# Patient Record
Sex: Female | Born: 1985 | Race: White | Hispanic: No | Marital: Single
Health system: Southern US, Academic
[De-identification: ages and names within clinical notes are randomized; demographics above are authoritative.]

## PROBLEM LIST (undated history)

## (undated) ENCOUNTER — Ambulatory Visit
Payer: PRIVATE HEALTH INSURANCE | Attending: Student in an Organized Health Care Education/Training Program | Primary: Student in an Organized Health Care Education/Training Program

## (undated) ENCOUNTER — Telehealth

## (undated) ENCOUNTER — Telehealth
Attending: Student in an Organized Health Care Education/Training Program | Primary: Student in an Organized Health Care Education/Training Program

## (undated) ENCOUNTER — Encounter: Attending: Family | Primary: Family

## (undated) ENCOUNTER — Encounter
Attending: Student in an Organized Health Care Education/Training Program | Primary: Student in an Organized Health Care Education/Training Program

## (undated) ENCOUNTER — Encounter

## (undated) ENCOUNTER — Ambulatory Visit: Attending: Family | Primary: Family

## (undated) ENCOUNTER — Ambulatory Visit: Payer: MEDICAID | Attending: Family | Primary: Family

## (undated) ENCOUNTER — Encounter: Attending: Clinical | Primary: Clinical

## (undated) ENCOUNTER — Telehealth
Attending: Pharmacist Clinician (PhC)/ Clinical Pharmacy Specialist | Primary: Pharmacist Clinician (PhC)/ Clinical Pharmacy Specialist

## (undated) ENCOUNTER — Ambulatory Visit

## (undated) ENCOUNTER — Ambulatory Visit: Payer: PRIVATE HEALTH INSURANCE | Attending: Clinical | Primary: Clinical

## (undated) ENCOUNTER — Ambulatory Visit: Payer: PRIVATE HEALTH INSURANCE

## (undated) ENCOUNTER — Ambulatory Visit: Attending: Surgery | Primary: Surgery

## (undated) ENCOUNTER — Ambulatory Visit: Payer: PRIVATE HEALTH INSURANCE | Attending: Critical Care Medicine | Primary: Critical Care Medicine

## (undated) ENCOUNTER — Ambulatory Visit
Attending: Pharmacist Clinician (PhC)/ Clinical Pharmacy Specialist | Primary: Pharmacist Clinician (PhC)/ Clinical Pharmacy Specialist

## (undated) ENCOUNTER — Encounter: Attending: Critical Care Medicine | Primary: Critical Care Medicine

## (undated) ENCOUNTER — Ambulatory Visit: Payer: PRIVATE HEALTH INSURANCE | Attending: Adult Health | Primary: Adult Health

## (undated) ENCOUNTER — Telehealth: Attending: Family | Primary: Family

## (undated) ENCOUNTER — Encounter: Attending: Gastroenterology | Primary: Gastroenterology

## (undated) ENCOUNTER — Telehealth: Attending: Hematology & Oncology | Primary: Hematology & Oncology

## (undated) ENCOUNTER — Encounter
Attending: Pharmacist Clinician (PhC)/ Clinical Pharmacy Specialist | Primary: Pharmacist Clinician (PhC)/ Clinical Pharmacy Specialist

## (undated) ENCOUNTER — Inpatient Hospital Stay

## (undated) ENCOUNTER — Encounter: Payer: PRIVATE HEALTH INSURANCE | Attending: Family | Primary: Family

## (undated) DIAGNOSIS — I10 Essential (primary) hypertension: Secondary | ICD-10-CM

## (undated) DIAGNOSIS — F1110 Opioid abuse, uncomplicated: Secondary | ICD-10-CM

## (undated) DIAGNOSIS — F419 Anxiety disorder, unspecified: Secondary | ICD-10-CM

## (undated) DIAGNOSIS — J45909 Unspecified asthma, uncomplicated: Secondary | ICD-10-CM

## (undated) HISTORY — PX: TONSILLECTOMY: SUR1361

## (undated) HISTORY — PX: TUBAL LIGATION: SHX77

## (undated) HISTORY — PX: DENTAL SURGERY: SHX609

## (undated) HISTORY — DX: Anxiety disorder, unspecified: F41.9

## (undated) HISTORY — PX: ABSCESS DRAINAGE: SHX1119

---
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
## 2009-02-15 ENCOUNTER — Emergency Department: Payer: Self-pay | Admitting: Emergency Medicine

## 2009-04-18 ENCOUNTER — Encounter: Payer: Self-pay | Admitting: Maternal & Fetal Medicine

## 2009-05-30 ENCOUNTER — Encounter: Payer: Self-pay | Admitting: Maternal and Fetal Medicine

## 2009-06-06 ENCOUNTER — Encounter: Payer: Self-pay | Admitting: Maternal & Fetal Medicine

## 2009-06-09 ENCOUNTER — Encounter: Payer: Self-pay | Admitting: Maternal and Fetal Medicine

## 2009-06-13 ENCOUNTER — Encounter: Payer: Self-pay | Admitting: Obstetrics & Gynecology

## 2009-06-16 ENCOUNTER — Encounter: Payer: Self-pay | Admitting: Maternal & Fetal Medicine

## 2009-06-16 ENCOUNTER — Observation Stay: Payer: Self-pay | Admitting: Obstetrics and Gynecology

## 2009-06-20 ENCOUNTER — Encounter: Payer: Self-pay | Admitting: Obstetrics and Gynecology

## 2009-06-21 ENCOUNTER — Observation Stay: Payer: Self-pay | Admitting: Obstetrics and Gynecology

## 2010-03-20 ENCOUNTER — Encounter: Payer: Self-pay | Admitting: Maternal & Fetal Medicine

## 2010-03-23 ENCOUNTER — Encounter: Payer: Self-pay | Admitting: Maternal & Fetal Medicine

## 2010-04-24 ENCOUNTER — Encounter: Payer: Self-pay | Admitting: Obstetrics & Gynecology

## 2010-07-17 ENCOUNTER — Encounter: Payer: Self-pay | Admitting: Maternal & Fetal Medicine

## 2010-07-24 ENCOUNTER — Encounter: Payer: Self-pay | Admitting: Obstetrics & Gynecology

## 2010-07-31 ENCOUNTER — Encounter: Payer: Self-pay | Admitting: Maternal & Fetal Medicine

## 2010-08-07 ENCOUNTER — Encounter: Payer: Self-pay | Admitting: Maternal and Fetal Medicine

## 2010-08-14 ENCOUNTER — Encounter: Payer: Self-pay | Admitting: Obstetrics and Gynecology

## 2010-08-15 ENCOUNTER — Inpatient Hospital Stay: Payer: Self-pay

## 2010-08-21 ENCOUNTER — Encounter: Payer: Self-pay | Admitting: Maternal & Fetal Medicine

## 2010-09-25 ENCOUNTER — Ambulatory Visit: Payer: Self-pay | Admitting: Obstetrics & Gynecology

## 2010-09-28 ENCOUNTER — Ambulatory Visit: Payer: Self-pay | Admitting: Obstetrics & Gynecology

## 2010-11-28 ENCOUNTER — Emergency Department: Payer: Self-pay | Admitting: Emergency Medicine

## 2011-07-12 ENCOUNTER — Emergency Department: Payer: Self-pay | Admitting: Emergency Medicine

## 2012-08-29 ENCOUNTER — Emergency Department: Payer: Self-pay | Admitting: Emergency Medicine

## 2013-10-02 ENCOUNTER — Emergency Department: Payer: Self-pay | Admitting: Emergency Medicine

## 2013-10-02 LAB — COMPREHENSIVE METABOLIC PANEL
Albumin: 3.8 g/dL (ref 3.4–5.0)
Creatinine: 0.72 mg/dL (ref 0.60–1.30)
EGFR (African American): >60
EGFR (Non-African Amer.): >60
Glucose: 99 mg/dL (ref 65–99)
Osmolality: 278 (ref 275–301)

## 2013-10-02 LAB — URINALYSIS, COMPLETE
Bacteria: NONE SEEN
Bilirubin,UR: NEGATIVE
Glucose,UR: NEGATIVE mg/dL (ref 0–75)
Ketone: NEGATIVE
Nitrite: NEGATIVE
Ph: 7 (ref 4.5–8.0)
Protein: NEGATIVE
RBC,UR: 1 /HPF (ref 0–5)
Specific Gravity: 1.01 (ref 1.003–1.030)
Squamous Epithelial: 4

## 2013-10-02 LAB — DRUG SCREEN, URINE
Amphetamines, Ur Screen: NEGATIVE (ref ?–1000)
Barbiturates, Ur Screen: NEGATIVE (ref ?–200)
Cocaine Metabolite,Ur ~~LOC~~: NEGATIVE (ref ?–300)
MDMA (Ecstasy)Ur Screen: NEGATIVE (ref ?–500)
Methadone, Ur Screen: NEGATIVE (ref ?–300)
Opiate, Ur Screen: POSITIVE (ref ?–300)
Phencyclidine (PCP) Ur S: NEGATIVE (ref ?–25)
Tricyclic, Ur Screen: NEGATIVE (ref ?–1000)

## 2013-10-02 LAB — ETHANOL: Ethanol %: <0.003 % (ref 0.000–0.080)

## 2013-10-02 LAB — CBC
MCH: 30.2 pg (ref 26.0–34.0)
MCV: 90 fL (ref 80–100)
RDW: 13.4 % (ref 11.5–14.5)

## 2014-06-09 ENCOUNTER — Emergency Department: Payer: Self-pay | Admitting: Emergency Medicine

## 2014-08-21 LAB — BASIC METABOLIC PANEL
ANION GAP: 9 (ref 7–16)
BUN: 8 mg/dL (ref 7–18)
Calcium, Total: 9.4 mg/dL (ref 8.5–10.1)
Chloride: 108 mmol/L — ABNORMAL HIGH (ref 98–107)
Co2: 23 mmol/L (ref 21–32)
Creatinine: 0.75 mg/dL (ref 0.60–1.30)
EGFR (Non-African Amer.): >60
GLUCOSE: 82 mg/dL (ref 65–99)
OSMOLALITY: 277 (ref 275–301)
POTASSIUM: 4 mmol/L (ref 3.5–5.1)
SODIUM: 140 mmol/L (ref 136–145)

## 2014-08-21 LAB — CBC
HCT: 42.2 % (ref 35.0–47.0)
HGB: 13.8 g/dL (ref 12.0–16.0)
MCH: 28.9 pg (ref 26.0–34.0)
MCHC: 32.7 g/dL (ref 32.0–36.0)
MCV: 88 fL (ref 80–100)
Platelet: 288 10*3/uL (ref 150–440)
RBC: 4.78 10*6/uL (ref 3.80–5.20)
RDW: 13.2 % (ref 11.5–14.5)
WBC: 13.9 10*3/uL — ABNORMAL HIGH (ref 3.6–11.0)

## 2014-08-22 ENCOUNTER — Observation Stay: Payer: Self-pay | Admitting: Surgery

## 2014-08-26 LAB — WOUND CULTURE

## 2014-09-15 ENCOUNTER — Emergency Department: Payer: Self-pay | Admitting: Emergency Medicine

## 2014-09-15 LAB — BASIC METABOLIC PANEL
ANION GAP: 6 — AB (ref 7–16)
BUN: 8 mg/dL (ref 7–18)
CALCIUM: 8.5 mg/dL (ref 8.5–10.1)
CO2: 25 mmol/L (ref 21–32)
Chloride: 109 mmol/L — ABNORMAL HIGH (ref 98–107)
Creatinine: 0.56 mg/dL — ABNORMAL LOW (ref 0.60–1.30)
Glucose: 87 mg/dL (ref 65–99)
Osmolality: 277 (ref 275–301)
Potassium: 4.2 mmol/L (ref 3.5–5.1)
Sodium: 140 mmol/L (ref 136–145)

## 2014-09-15 LAB — CBC WITH DIFFERENTIAL/PLATELET
Basophil #: 0.1 10*3/uL (ref 0.0–0.1)
Basophil %: 0.6 %
Eosinophil #: 0.9 10*3/uL — ABNORMAL HIGH (ref 0.0–0.7)
Eosinophil %: 7 %
HCT: 40.2 % (ref 35.0–47.0)
HGB: 13.3 g/dL (ref 12.0–16.0)
Lymphocyte #: 3 10*3/uL (ref 1.0–3.6)
Lymphocyte %: 23.8 %
MCH: 28.6 pg (ref 26.0–34.0)
MCHC: 33.1 g/dL (ref 32.0–36.0)
MCV: 87 fL (ref 80–100)
Monocyte #: 1 x10 3/mm — ABNORMAL HIGH (ref 0.2–0.9)
Monocyte %: 8.1 %
NEUTROS PCT: 60.5 %
Neutrophil #: 7.7 10*3/uL — ABNORMAL HIGH (ref 1.4–6.5)
Platelet: 322 10*3/uL (ref 150–440)
RBC: 4.64 10*6/uL (ref 3.80–5.20)
RDW: 13.9 % (ref 11.5–14.5)
WBC: 12.7 10*3/uL — AB (ref 3.6–11.0)

## 2014-10-31 ENCOUNTER — Emergency Department: Payer: Self-pay | Admitting: Emergency Medicine

## 2014-10-31 LAB — CBC
HCT: 43.1 %
HGB: 14.1 g/dL
MCH: 28.2 pg
MCHC: 32.7 g/dL
MCV: 86 fL
Platelet: 270 x10 3/mm 3
RBC: 5.01 X10 6/mm 3
RDW: 14.3 %
WBC: 6.1 x10 3/mm 3

## 2015-01-01 ENCOUNTER — Emergency Department: Payer: Self-pay | Admitting: Emergency Medicine

## 2015-01-01 LAB — BASIC METABOLIC PANEL
ANION GAP: 5 — AB (ref 7–16)
BUN: 8 mg/dL (ref 7–18)
CHLORIDE: 109 mmol/L — AB (ref 98–107)
Calcium, Total: 8.2 mg/dL — ABNORMAL LOW (ref 8.5–10.1)
Co2: 25 mmol/L (ref 21–32)
Creatinine: 0.74 mg/dL (ref 0.60–1.30)
EGFR (African American): >60
EGFR (Non-African Amer.): >60
Glucose: 92 mg/dL (ref 65–99)
OSMOLALITY: 276 (ref 275–301)
POTASSIUM: 4 mmol/L (ref 3.5–5.1)
SODIUM: 139 mmol/L (ref 136–145)

## 2015-01-01 LAB — CBC WITH DIFFERENTIAL/PLATELET
Basophil #: 0.1 10*3/uL (ref 0.0–0.1)
Basophil %: 0.6 %
EOS ABS: 0.6 10*3/uL (ref 0.0–0.7)
Eosinophil %: 6.5 %
HCT: 38.5 % (ref 35.0–47.0)
HGB: 12.5 g/dL (ref 12.0–16.0)
Lymphocyte #: 2.2 10*3/uL (ref 1.0–3.6)
Lymphocyte %: 23.1 %
MCH: 28.4 pg (ref 26.0–34.0)
MCHC: 32.6 g/dL (ref 32.0–36.0)
MCV: 87 fL (ref 80–100)
Monocyte #: 0.8 x10 3/mm (ref 0.2–0.9)
Monocyte %: 8.5 %
NEUTROS PCT: 61.3 %
Neutrophil #: 5.8 10*3/uL (ref 1.4–6.5)
Platelet: 262 10*3/uL (ref 150–440)
RBC: 4.41 10*6/uL (ref 3.80–5.20)
RDW: 13.8 % (ref 11.5–14.5)
WBC: 9.4 10*3/uL (ref 3.6–11.0)

## 2015-01-06 LAB — CULTURE, BLOOD (SINGLE)

## 2015-02-22 IMAGING — US US BREAST*L* LIMITED INC AXILLA
1 series · 6 of 6 positions shown · non-contrast
Comparison: August 22, 2014

CLINICAL DATA: Recent abscess status post surgical debridement and
drainage. The site of prior abscess is again draining and is red.

EXAM:
ULTRASOUND OF THE left BREAST

[Series 1: us breast*left* limited inc axilla · 0.08mm/px · 6 of 6 slices shown]
[im 1/6]
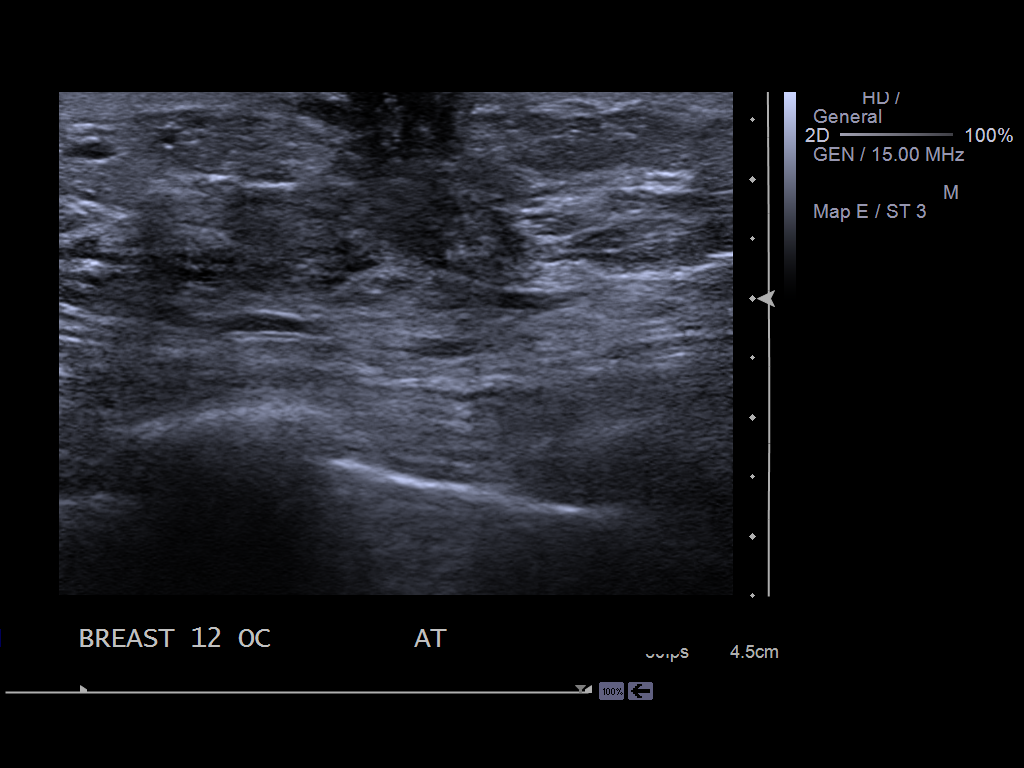
[im 2/6]
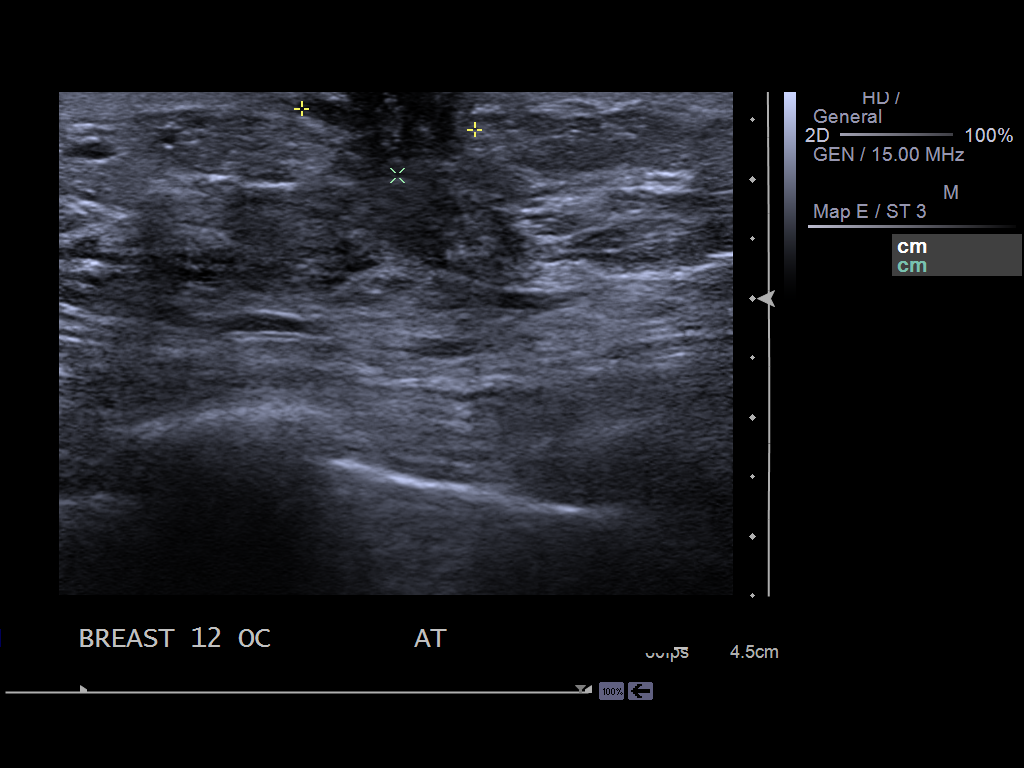
[im 3/6]
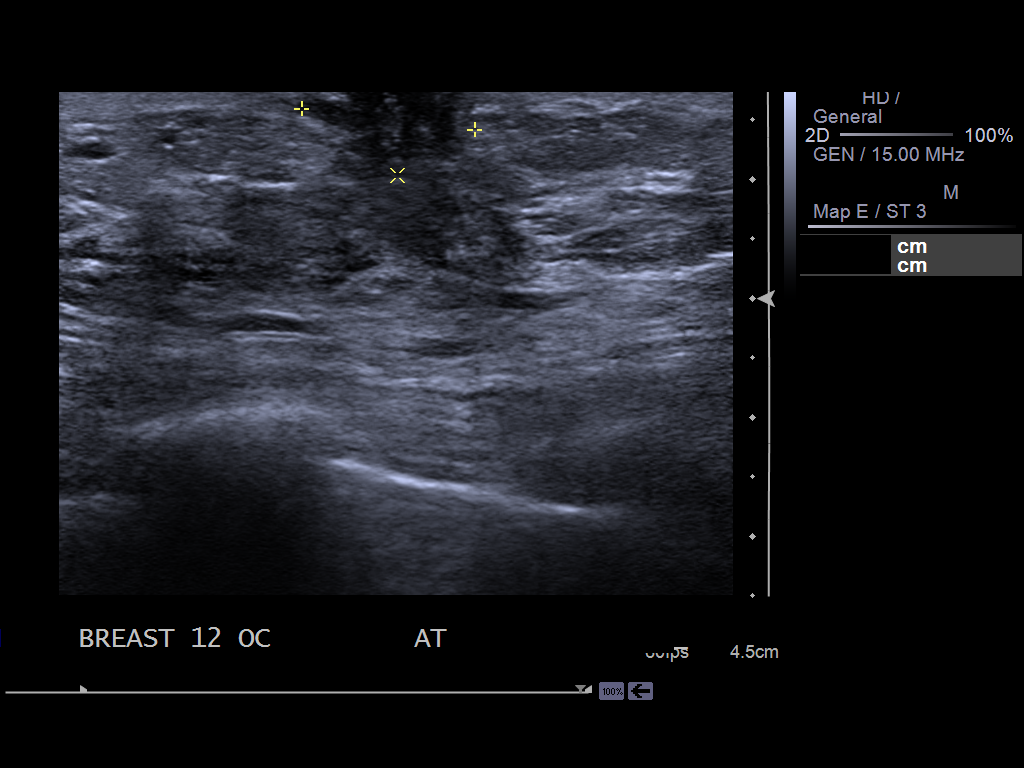
[im 4/6]
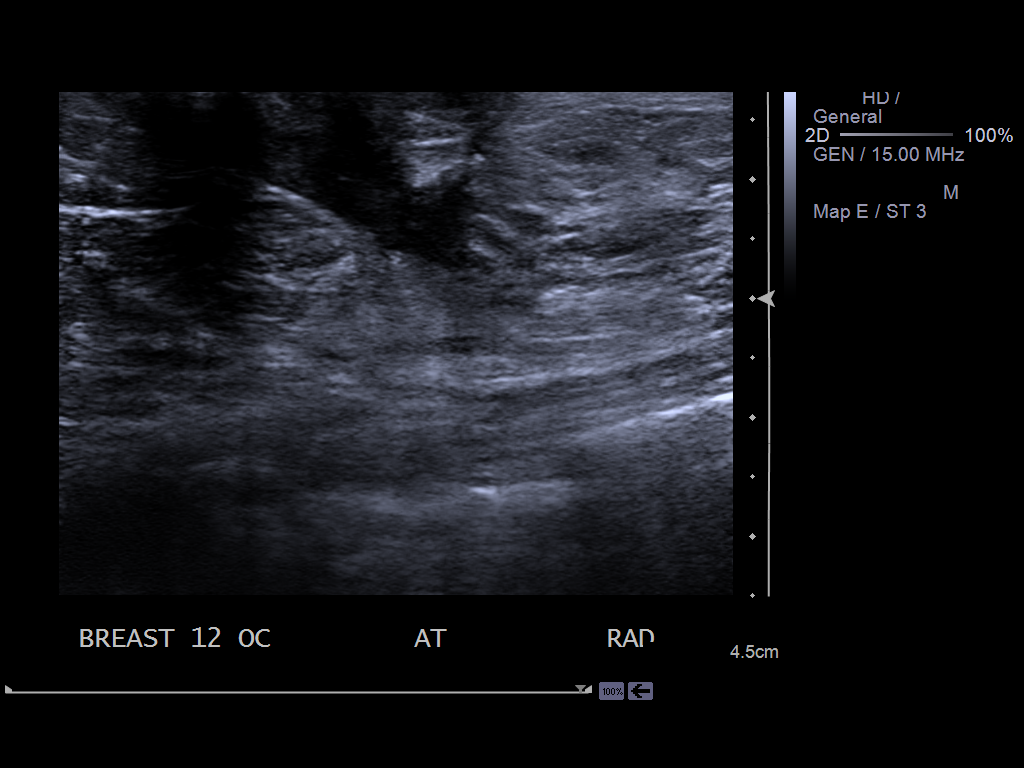
[im 5/6]
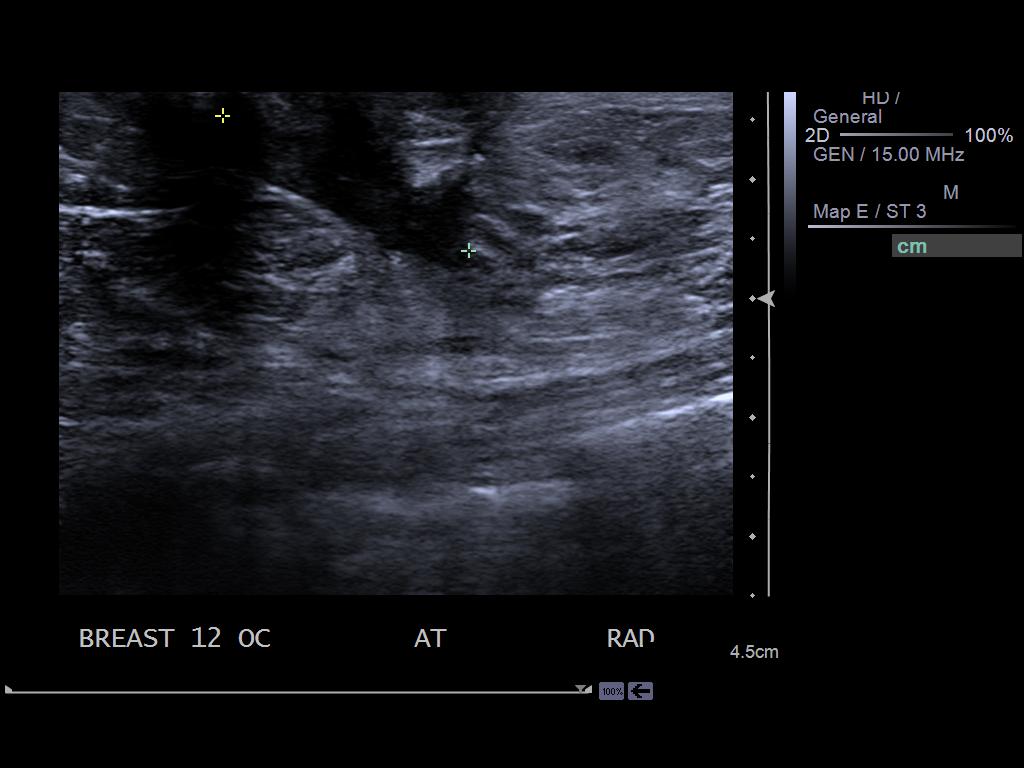
[im 6/6]
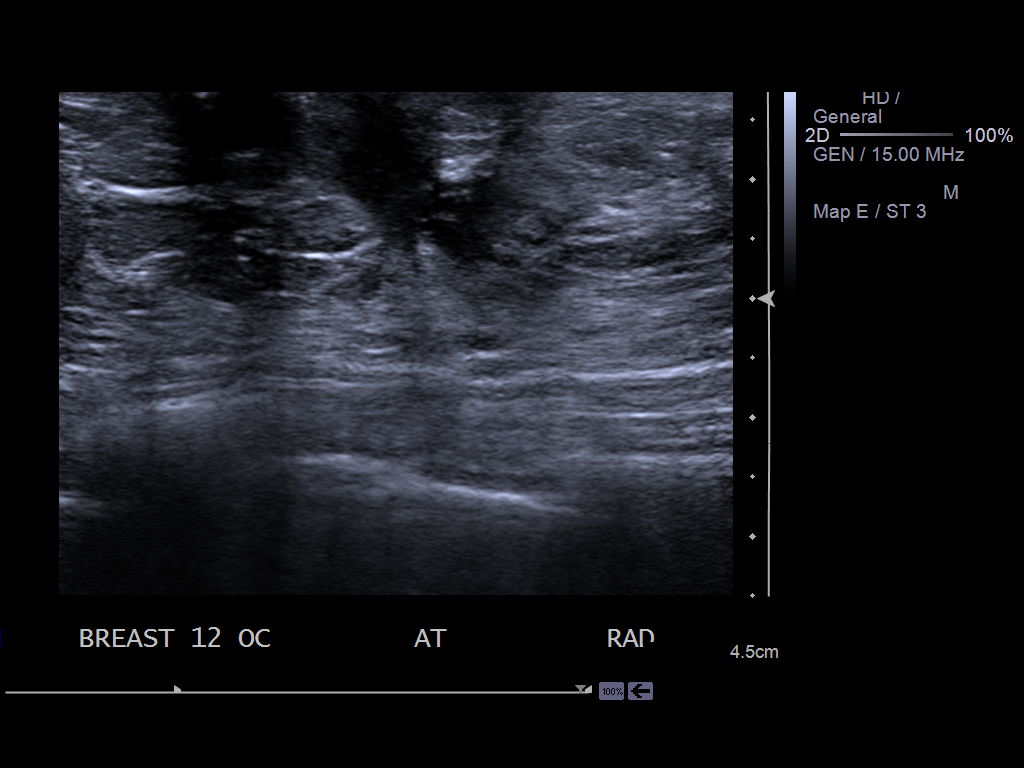

[6 of 6 positions shown; findings below may reference images not displayed]

FINDINGS: Ultrasound is performed, showing a 2.36 cm abscess at the left
breast 12 o'clock subareolar region subjacent to the incision site.
IMPRESSION: Benign findings.

RECOMMENDATION:
Surgical consultation.

I have discussed the findings and recommendations with the patient.
Results were also provided in writing at the conclusion of the
visit. If applicable, a reminder letter will be sent to the patient
regarding the next appointment.

BI-RADS CATEGORY  2: Benign Finding(s)

## 2015-02-27 ENCOUNTER — Emergency Department: Payer: Self-pay | Admitting: Emergency Medicine

## 2015-03-01 ENCOUNTER — Emergency Department: Payer: Self-pay | Admitting: Emergency Medicine

## 2015-03-19 ENCOUNTER — Emergency Department: Payer: Self-pay | Admitting: Emergency Medicine

## 2015-03-19 LAB — BASIC METABOLIC PANEL
Anion Gap: 7 (ref 7–16)
BUN: 10 mg/dL
Calcium, Total: 8.8 mg/dL — ABNORMAL LOW
Chloride: 102 mmol/L
Co2: 24 mmol/L
Creatinine: 0.85 mg/dL
EGFR (African American): >60
EGFR (Non-African Amer.): >60
GLUCOSE: 94 mg/dL
Potassium: 4.1 mmol/L
SODIUM: 133 mmol/L — AB

## 2015-03-19 LAB — CBC WITH DIFFERENTIAL/PLATELET
BASOS ABS: 0 10*3/uL (ref 0.0–0.1)
BASOS PCT: 0.5 %
Eosinophil #: 0.7 10*3/uL (ref 0.0–0.7)
Eosinophil %: 9.7 %
HCT: 39.6 % (ref 35.0–47.0)
HGB: 13.3 g/dL (ref 12.0–16.0)
LYMPHS PCT: 27.7 %
Lymphocyte #: 1.9 10*3/uL (ref 1.0–3.6)
MCH: 28.7 pg (ref 26.0–34.0)
MCHC: 33.6 g/dL (ref 32.0–36.0)
MCV: 85 fL (ref 80–100)
MONOS PCT: 8 %
Monocyte #: 0.5 x10 3/mm (ref 0.2–0.9)
Neutrophil #: 3.7 10*3/uL (ref 1.4–6.5)
Neutrophil %: 54.1 %
Platelet: 303 10*3/uL (ref 150–440)
RBC: 4.63 10*6/uL (ref 3.80–5.20)
RDW: 14.6 % — ABNORMAL HIGH (ref 11.5–14.5)
WBC: 6.9 10*3/uL (ref 3.6–11.0)

## 2015-03-25 LAB — WOUND CULTURE

## 2015-04-16 NOTE — H&P (Signed)
   Subjective/Chief Complaint left breast abscess   History of Present Illness one week worsening left br pain, throbbing, now draining no f/c no prior episode no prior mass, no trauma history, not postpartum   Past History PMH tob abuse PSH none   Past Medical Health Smoking   Past Med/Surgical Hx:  Denies medical history:   Tonsillectomy:   Tubal Ligation:   Denies surgical history.:   ALLERGIES:  Tramadol: Rash  Family and Social History:  Family History Non-Contributory   Social History positive  tobacco, positive tobacco (Greater than 1 year), negative ETOH, homemaker   + Tobacco Current (within 1 year)   Place of Living Home   Review of Systems:  Fever/Chills No   Cough No   Abdominal Pain No   Diarrhea No   Constipation No   Nausea/Vomiting No   SOB/DOE No   Chest Pain No   Dysuria No   Tolerating Diet Yes   Medications/Allergies Reviewed Medications/Allergies reviewed   Physical Exam:  GEN no acute distress   HEENT pink conjunctivae   NECK supple   RESP normal resp effort  clear BS   CARD regular rate   ABD denies tenderness  no hernia   LYMPH negative neck   EXTR negative edema   SKIN normal to palpation   PSYCH alert, A+O to time, place, person   Additional Comments left breast erythematous hard, fluctuant, draining min pus from nipple areolar complex medial very tender   Lab Results: Routine Chem:  29-Aug-15 20:08   Glucose, Serum 82  BUN 8  Creatinine (comp) 0.75  Sodium, Serum 140  Potassium, Serum 4.0  Chloride, Serum  108  CO2, Serum 23  Calcium (Total), Serum 9.4  Anion Gap 9  Osmolality (calc) 277  eGFR (African American) >60  eGFR (Non-African American) >60 (eGFR values <51mL/min/1.73 m2 may be an indication of chronic kidney disease (CKD). Calculated eGFR is useful in patients with stable renal function. The eGFR calculation will not be reliable in acutely ill patients when serum creatinine is changing  rapidly. It is not useful in  patients on dialysis. The eGFR calculation may not be applicable to patients at the low and high extremes of body sizes, pregnant women, and vegetarians.)  Routine Hem:  29-Aug-15 20:08   WBC (CBC)  13.9  RBC (CBC) 4.78  Hemoglobin (CBC) 13.8  Hematocrit (CBC) 42.2  Platelet Count (CBC) 288 (Result(s) reported on 21 Aug 2014 at 08:21PM.)  MCV 88  MCH 28.9  MCHC 32.7  RDW 13.2    Assessment/Admission Diagnosis U/S not yet read but pers rev'd large left br abscess will need I&D in OR options rationale and risks rev'd agrees with plan   Electronic Signatures: Florene Glen (MD)  (Signed 30-Aug-15 04:22)  Authored: CHIEF COMPLAINT and HISTORY, PAST MEDICAL/SURGIAL HISTORY, ALLERGIES, FAMILY AND SOCIAL HISTORY, REVIEW OF SYSTEMS, PHYSICAL EXAM, LABS, ASSESSMENT AND PLAN   Last Updated: 30-Aug-15 04:22 by Florene Glen (MD)

## 2015-04-16 NOTE — H&P (Signed)
PATIENT NAME:  Denise Rubio, Denise Rubio MR#:  [id number] DATE OF BIRTH:  1986-03-02  DATE OF ADMISSION:  08/22/2014  CHIEF COMPLAINT: Left breast pain.   HISTORY OF PRESENT ILLNESS: This is a patient with 1 week of left breast pain, which has been worsening and it started draining yesterday. She has had no fevers or chills. No nausea, vomiting, no prior episode and no mass prior to this episode. She is not postpartum and denies any breast injury. A work-up in the Emergency Room suggested breast abscess. I was consulted to see the patient.   PAST MEDICAL HISTORY: None.   PAST SURGICAL HISTORY: None, denies any prior operations.   ALLERGIES: TRAMADOL.   MEDICATIONS: None.   FAMILY HISTORY: Noncontributory.   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   REVIEW OF SYSTEMS: A 10 system review has been performed and negative with the exception of that mentioned in the history of present illness.   PHYSICAL EXAMINATION:  GENERAL: Healthy, comfortable-appearing female patient. Multiple piercings are noted.  VITAL SIGNS: Temperature of 98.7, pulse of 82, respirations 18, blood pressure 137/85, 98% room air saturation. Pain scale of 10.  HEENT: No scleral icterus.  NECK: No palpable neck nodes.  CHEST: Clear to auscultation.  CARDIAC: Regular rate and rhythm.  ABDOMEN: Soft, nontender.  EXTREMITIES: Without edema.  NEUROLOGIC: Grossly intact.  INTEGUMENT: No jaundice.  BREASTS: Shows no mass in the right breast. Left breast is hard and firm. It is erythematous, especially superiorly. There is obvious, but minimal, drainage from a spot on the nipple areolar complex somewhat medially which is frank pus. There is fluctuance in the area of the nipple areolar complex superiorly at 12:00.   An ultrasound report is not yet available, but the films been personally reviewed and are highly suggestive  of a large abscess.   Electrolytes are within normal limits.   White blood cell count is 13.9, hemoglobin and hematocrit of 13.8 and 42 with a platelet count of 288,000. Chest x-ray is within normal limits.   ASSESSMENT AND PLAN: This is a patient with a left breast abscess that will require incision and drainage in the operating room. The rationale for this has been discussed. The options of observation have been reviewed. The risks of bleeding, infection, multiple incisions, drain tubes and recurrence as well as, cosmetic deformity have all been reviewed with her. Nursing staff was present, as was a family member who was asleep in the ER. The patient understood and agreed to proceed with this plan.    ____________________________ Adah Salvageichard E. Excell Seltzerooper, MD rec:JT D: 08/22/2014 04:25:50 ET T: 08/22/2014 05:02:18 ET JOB#: [id number]  cc: Adah Salvageichard E. Excell Seltzerooper, MD, <Dictator> Lattie HawICHARD E Shaquasha Gerstel MD ELECTRONICALLY SIGNED 08/22/2014 19:37

## 2015-04-16 NOTE — Op Note (Signed)
PATIENT NAME:  Denise Rubio, Denise Rubio MR#:  [id number] DATE OF BIRTH:  01/05/1986  DATE OF PROCEDURE:  08/22/2014  PREOPERATIVE DIAGNOSIS: Left breast abscess.   POSTOPERATIVE DIAGNOSIS: Left breast abscess.  PROCEDURE PERFORMED: Incision and drainage, left breast abscess.   ANESTHESIA: General.   SURGEON: Quentin Orealph L. Ely, MD  OPERATIVE PROCEDURE: With the patient in the supine position, after the induction of appropriate general anesthesia, the patient's left breast was prepped and draped in sterile towels. An incision was made along the areolar border in the upper outer quadrant and carried down bluntly through the subcutaneous tissue. A large amount of creamy purulent material was encountered. It was cultured. The area was dissected with a Kelly clamp, irrigated, and a Penrose drain placed. It did not appear to communicate to any other major pocket in the breast and appeared to be confined to the right upper outer quadrant. Drain was secured with 3-0 nylon. Sterile dressing was applied. The patient returned to the recovery room, having tolerated the procedure well. Sponge, instrument and needle counts were correct x 2 in the operating room.   ____________________________ Quentin Orealph L. Ely III, MD rle:TT D: 08/22/2014 13:20:58 ET T: 08/22/2014 20:57:09 ET JOB#: [id number]  cc: Quentin Orealph L. Ely III, MD, <Dictator> Quentin OreALPH L ELY MD ELECTRONICALLY SIGNED 08/26/2014 11:16

## 2015-04-24 NOTE — Consult Note (Signed)
Brief Consult Note: Diagnosis: recurrent left subareolar abscess.   Patient was seen by consultant.   Discussed with Attending MD.   Comments: [age] yowf who had this surgically drained in August. Recurred this week and went to a funeral this AM. Small (< 2 cm), with minimal surrounding erythema, pointing to area of previous incision. Not interested in surgery. I suggested she continue warm soaks and explained such to Dr Fanny BienQuale in ED, who plans to send her back to our clinic on Augmentin and oral analgesics.  Electronic Signatures: Claude MangesMarterre, Chalsey Leeth F (MD)  (Signed 09-Jan-16 01:43)  Authored: Brief Consult Note   Last Updated: 09-Jan-16 01:43 by Claude MangesMarterre, Kenedie Dirocco F (MD)

## 2015-05-17 ENCOUNTER — Encounter: Payer: Self-pay | Admitting: Emergency Medicine

## 2015-05-17 ENCOUNTER — Emergency Department
Admission: EM | Admit: 2015-05-17 | Discharge: 2015-05-17 | Disposition: A | Discharge status: Home / Self Care | Payer: Self-pay | Attending: Emergency Medicine | Admitting: Emergency Medicine

## 2015-05-17 DIAGNOSIS — Z720 Tobacco use: Secondary | ICD-10-CM | POA: Insufficient documentation

## 2015-05-17 DIAGNOSIS — N61 Inflammatory disorders of breast: Secondary | ICD-10-CM | POA: Insufficient documentation

## 2015-05-17 DIAGNOSIS — N611 Abscess of the breast and nipple: Secondary | ICD-10-CM

## 2015-05-17 MED ORDER — HYDROCODONE-ACETAMINOPHEN 5-325 MG PO TABS
1.0000 | ORAL_TABLET | Freq: Once | ORAL | Status: AC
  Administered 2015-05-17: 1 via ORAL

## 2015-05-17 MED ORDER — CEFTRIAXONE SODIUM 1 G IJ SOLR
1.0000 g | Freq: Once | INTRAMUSCULAR | Status: AC
  Administered 2015-05-17: 1 g via INTRAMUSCULAR

## 2015-05-17 MED ORDER — IBUPROFEN 800 MG PO TABS
800.0000 mg | ORAL_TABLET | Freq: Once | ORAL | Status: AC
  Administered 2015-05-17: 800 mg via ORAL

## 2015-05-17 MED ORDER — SULFAMETHOXAZOLE-TRIMETHOPRIM 800-160 MG PO TABS
ORAL_TABLET | ORAL | Status: AC
  Filled 2015-05-17: qty 1

## 2015-05-17 MED ORDER — HYDROCODONE-ACETAMINOPHEN 5-325 MG PO TABS: 1.0000 | ORAL_TABLET | ORAL | Status: DC | PRN

## 2015-05-17 MED ORDER — IBUPROFEN 800 MG PO TABS: 800.0000 mg | ORAL_TABLET | Freq: Three times a day (TID) | ORAL | Status: DC | PRN

## 2015-05-17 MED ORDER — HYDROCODONE-ACETAMINOPHEN 5-325 MG PO TABS
ORAL_TABLET | ORAL | Status: AC
  Filled 2015-05-17: qty 1

## 2015-05-17 MED ORDER — LIDOCAINE HCL (PF) 1 % IJ SOLN
INTRAMUSCULAR | Status: AC
  Administered 2015-05-17: 5 mL
  Filled 2015-05-17: qty 5

## 2015-05-17 MED ORDER — SULFAMETHOXAZOLE-TRIMETHOPRIM 800-160 MG PO TABS: 1.0000 | ORAL_TABLET | Freq: Two times a day (BID) | ORAL | Status: DC

## 2015-05-17 MED ORDER — SULFAMETHOXAZOLE-TRIMETHOPRIM 800-160 MG PO TABS
1.0000 | ORAL_TABLET | Freq: Two times a day (BID) | ORAL | Status: DC
  Administered 2015-05-17: 1 via ORAL

## 2015-05-17 MED ORDER — IBUPROFEN 800 MG PO TABS
ORAL_TABLET | ORAL | Status: AC
  Filled 2015-05-17: qty 1

## 2015-05-17 MED ORDER — CEFTRIAXONE SODIUM 1 G IJ SOLR
INTRAMUSCULAR | Status: AC
  Filled 2015-05-17: qty 10

## 2015-05-17 NOTE — Discharge Instructions (Signed)

## 2015-05-17 NOTE — ED Notes (Signed)
Pt alert and oriented X4, active, cooperative, pt in NAD. RR even and unlabored, color WNL.  Pt informed to return if any life threatening symptoms occur.   

## 2015-05-17 NOTE — ED Notes (Addendum)
Reports abscess in left breast and had surgery on it in august.  Reports ongoing issues with infection since, pain worse today

## 2015-05-17 NOTE — ED Notes (Signed)
Had abcess removed from left breast this past august , still has slight reddness to area

## 2015-05-17 NOTE — ED Provider Notes (Signed)
CSN: [id number]     Arrival date & time 05/17/15  1439 History   First MD Initiated Contact with Patient 05/17/15 1514     Chief Complaint  Patient presents with  . Breast Pain     (Consider location/radiation/quality/duration/timing/severity/associated sxs/prior Treatment) HPI  Patient who has had swelling of her left breast with draining for approximately 2 days has a recurrent abscess that she states will not heal rates her pain as about a 9 out of 10 worse with any type of movement or touch to the area states that she manually expelled a large amount of pus from the area and now has pain and redness to the area nothing seems to make it particularly better or worse denies fevers chills nausea vomiting or any other complaints of note  History reviewed. No pertinent past medical history. Past Surgical History  Procedure Laterality Date  . Abscess drainage    . Tubal ligation     History reviewed. No pertinent family history. History  Substance Use Topics  . Smoking status: Current Every Day Smoker  . Smokeless tobacco: Not on file  . Alcohol Use: No   OB History    No data available     Review of Systems  Constitutional: Negative.   HENT: Negative.   Eyes: Negative.   Respiratory: Negative.   Cardiovascular: Negative.   Skin: Positive for wound.  Hematological: Negative.   All other systems reviewed and are negative.      Allergies  Tramadol hcl  Home Medications   Prior to Admission medications   Medication Sig Start Date End Date Taking? Authorizing Provider  HYDROcodone-acetaminophen (NORCO/VICODIN) 5-325 MG per tablet Take 1 tablet by mouth every 4 (four) hours as needed for moderate pain. 05/17/15   Daizee Firmin William C Laquilla Dault, PA-C  ibuprofen (ADVIL,MOTRIN) 800 MG tablet Take 1 tablet (800 mg total) by mouth every 8 (eight) hours as needed. 05/17/15   Dustine Bertini William C Ronzell Laban, PA-C  sulfamethoxazole-trimethoprim (BACTRIM DS,SEPTRA DS) 800-160 MG per tablet Take 1  tablet by mouth 2 (two) times daily. 05/17/15   Adekunle Rohrbach William C Naven Giambalvo, PA-C   BP 130/97 mmHg  Pulse 97  Temp(Src) 98.2 F (36.8 C) (Oral)  Ht 5\' 1"  (1.549 m)  Wt 110 lb (49.896 kg)  BMI 20.80 kg/m2  SpO2 100% Physical Exam Female appearing stated age well-developed well-nourished no acute distress Vitals reviewed Head ears eyes nose next throat exam negative pupils equal round reactive light accommodation externaocular motions are intact Cardiovascular regular rate and rhythm no murmurs rubs gallops Pulmonary lungs process bilaterally Musculoskeletal unremarkable moving all extremities Skin patient has a draining area the upper right area of her left areole a tenderness that area without erythema no fluctuance mild induration no palpable adenopathy or mass Neuro exam nonfocal cranial nerves II through XII grossly intact ED Course  Procedures (including critical care time)   MDM  Decision making on this patient she has an open draining abscess to her left breast appears to be a chronic issue that flares up has been unable to have surgical follow-up since surgery last August states wound is never fully healed and is been relapsing as here today for antibiotic therapy and pain management will start her on antibiotics try to get her to follow up with surgery but otherwise return here in 2 days for a wound check Final diagnoses:  Abscess of breast       Jaycie Kregel Rosalyn GessWilliam C Precilla Purnell, PA-C 05/17/15 1552

## 2015-06-22 ENCOUNTER — Emergency Department
Admission: EM | Admit: 2015-06-22 | Discharge: 2015-06-22 | Disposition: A | Discharge status: Home / Self Care | Payer: Self-pay | Attending: Emergency Medicine | Admitting: Emergency Medicine

## 2015-06-22 ENCOUNTER — Encounter: Payer: Self-pay | Admitting: Emergency Medicine

## 2015-06-22 DIAGNOSIS — F1120 Opioid dependence, uncomplicated: Secondary | ICD-10-CM | POA: Insufficient documentation

## 2015-06-22 DIAGNOSIS — Z720 Tobacco use: Secondary | ICD-10-CM | POA: Insufficient documentation

## 2015-06-22 DIAGNOSIS — F11220 Opioid dependence with intoxication, uncomplicated: Secondary | ICD-10-CM

## 2015-06-22 DIAGNOSIS — F1220 Cannabis dependence, uncomplicated: Secondary | ICD-10-CM | POA: Insufficient documentation

## 2015-06-22 DIAGNOSIS — Z792 Long term (current) use of antibiotics: Secondary | ICD-10-CM | POA: Insufficient documentation

## 2015-06-22 DIAGNOSIS — F1320 Sedative, hypnotic or anxiolytic dependence, uncomplicated: Secondary | ICD-10-CM | POA: Insufficient documentation

## 2015-06-22 DIAGNOSIS — F1420 Cocaine dependence, uncomplicated: Secondary | ICD-10-CM | POA: Insufficient documentation

## 2015-06-22 DIAGNOSIS — Z3202 Encounter for pregnancy test, result negative: Secondary | ICD-10-CM | POA: Insufficient documentation

## 2015-06-22 LAB — URINE DRUG SCREEN, QUALITATIVE (ARMC ONLY)
Amphetamines, Ur Screen: NOT DETECTED
BARBITURATES, UR SCREEN: NOT DETECTED
BENZODIAZEPINE, UR SCRN: POSITIVE — AB
Cannabinoid 50 Ng, Ur ~~LOC~~: POSITIVE — AB
Cocaine Metabolite,Ur ~~LOC~~: POSITIVE — AB
MDMA (Ecstasy)Ur Screen: NOT DETECTED
METHADONE SCREEN, URINE: NOT DETECTED
Opiate, Ur Screen: POSITIVE — AB
Phencyclidine (PCP) Ur S: NOT DETECTED
TRICYCLIC, UR SCREEN: NOT DETECTED

## 2015-06-22 LAB — COMPREHENSIVE METABOLIC PANEL
ALK PHOS: 78 U/L (ref 38–126)
ALT: 12 U/L — ABNORMAL LOW (ref 14–54)
ANION GAP: 8 (ref 5–15)
AST: 18 U/L (ref 15–41)
Albumin: 4 g/dL (ref 3.5–5.0)
BUN: 11 mg/dL (ref 6–20)
CHLORIDE: 104 mmol/L (ref 101–111)
CO2: 27 mmol/L (ref 22–32)
CREATININE: 0.73 mg/dL (ref 0.44–1.00)
Calcium: 9.4 mg/dL (ref 8.9–10.3)
GLUCOSE: 93 mg/dL (ref 65–99)
POTASSIUM: 3.8 mmol/L (ref 3.5–5.1)
Sodium: 139 mmol/L (ref 135–145)
Total Bilirubin: 0.5 mg/dL (ref 0.3–1.2)
Total Protein: 7.8 g/dL (ref 6.5–8.1)

## 2015-06-22 LAB — URINALYSIS COMPLETE WITH MICROSCOPIC (ARMC ONLY)
Bacteria, UA: NONE SEEN
Bilirubin Urine: NEGATIVE
GLUCOSE, UA: NEGATIVE mg/dL
HGB URINE DIPSTICK: NEGATIVE
Ketones, ur: NEGATIVE mg/dL
Leukocytes, UA: NEGATIVE
NITRITE: NEGATIVE
Protein, ur: NEGATIVE mg/dL
Specific Gravity, Urine: 1.01 (ref 1.005–1.030)
pH: 6 (ref 5.0–8.0)

## 2015-06-22 LAB — CBC
HCT: 40.3 % (ref 35.0–47.0)
Hemoglobin: 13.4 g/dL (ref 12.0–16.0)
MCH: 28.3 pg (ref 26.0–34.0)
MCHC: 33.1 g/dL (ref 32.0–36.0)
MCV: 85.4 fL (ref 80.0–100.0)
Platelets: 318 10*3/uL (ref 150–440)
RBC: 4.72 MIL/uL (ref 3.80–5.20)
RDW: 15.7 % — AB (ref 11.5–14.5)
WBC: 8.8 10*3/uL (ref 3.6–11.0)

## 2015-06-22 LAB — ACETAMINOPHEN LEVEL: Acetaminophen (Tylenol), Serum: <10 ug/mL — ABNORMAL LOW (ref 10–30)

## 2015-06-22 LAB — SALICYLATE LEVEL: SALICYLATE LVL: 10.8 mg/dL (ref 2.8–30.0)

## 2015-06-22 LAB — ETHANOL

## 2015-06-22 LAB — POCT PREGNANCY, URINE: Preg Test, Ur: NEGATIVE

## 2015-06-22 NOTE — ED Notes (Addendum)
Pt states she is here for detox from heroin. Pt states last use was this morning at approx 0800. Pt states she has also been using marijuanna, last use today at approx 11am. Pt states she has been using heroin for 5 years. Denies drinking alcohol at this time.

## 2015-06-22 NOTE — ED Notes (Signed)
BEHAVIORAL HEALTH ROUNDING Patient sleeping: No. Patient alert and oriented: yes Behavior appropriate: Yes.  ; If no, describe:  Nutrition and fluids offered: Yes  Toileting and hygiene offered: Yes  Sitter present: no Law enforcement present: Yes  

## 2015-06-22 NOTE — ED Notes (Signed)
ED BHU PLACEMENT JUSTIFICATION Is the patient under IVC or is there intent for IVC: No. Is the patient medically cleared: Yes.   Is there vacancy in the ED BHU: Yes.   Is the population mix appropriate for patient: Yes.   Is the patient awaiting placement in inpatient or outpatient setting: Yes.   Has the patient had a psychiatric consult: No. Survey of unit performed for contraband, proper placement and condition of furniture, tampering with fixtures in bathroom, shower, and each patient room: Yes.  ; Findings:   APPEARANCE/BEHAVIOR calm and cooperative NEURO ASSESSMENT Orientation: time, place and person Hallucinations: No.None noted (Hallucinations) Speech: Normal Gait: normal RESPIRATORY ASSESSMENT Normal expansion.  Clear to auscultation.  No rales, rhonchi, or wheezing. CARDIOVASCULAR ASSESSMENT regular rate and rhythm, S1, S2 normal, no murmur, click, rub or gallop GASTROINTESTINAL ASSESSMENT soft, nontender, BS WNL, no r/g EXTREMITIES normal strength, tone, and muscle mass PLAN OF CARE Provide calm/safe environment. Vital signs assessed twice daily. ED BHU Assessment once each 12-hour shift. Collaborate with intake RN daily or as condition indicates. Assure the ED provider has rounded once each shift. Provide and encourage hygiene. Provide redirection as needed. Assess for escalating behavior; address immediately and inform ED provider.  Assess family dynamic and appropriateness for visitation as needed: No.; If necessary, describe findings: Family not available. Educate the patient/family about BHU procedures/visitation: Yes.  ; If necessary, describe findings:

## 2015-06-22 NOTE — ED Notes (Signed)
Patient assigned to appropriate care area. Patient oriented to unit/care area: Informed that, for their safety, care areas are designed for safety and monitored by security cameras at all times; and visiting hours explained to patient. Patient verbalizes understanding, and verbal contract for safety obtained. 

## 2015-06-22 NOTE — ED Provider Notes (Signed)
[facility] Emergency Department Provider Note  ____________________________________________  Time seen: Approximately 6:48 PM  I have reviewed the triage vital signs and the nursing notes.   HISTORY  Chief Complaint Drug Problem    HPI Denise Rubio is a 29 y.o. 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  She last used heroin by injection today.  She has been to multiple inpatient facilities including [facility], [facility], and most recently, [facility].  She denies any other medical issues.  She has no complaints at this time.   History reviewed. No pertinent past medical history.  There are no active problems to display for this patient.   Past Surgical History  Procedure Laterality Date  . Abscess drainage    . Tubal ligation      Current Outpatient Rx  Name  Route  Sig  Dispense  Refill  . HYDROcodone-acetaminophen (NORCO/VICODIN) 5-325 MG per tablet   Oral   Take 1 tablet by mouth every 4 (four) hours as needed for moderate pain.   20 tablet   0   . ibuprofen (ADVIL,MOTRIN) 800 MG tablet   Oral   Take 1 tablet (800 mg total) by mouth every 8 (eight) hours as needed.   30 tablet   0   . sulfamethoxazole-trimethoprim (BACTRIM DS,SEPTRA DS) 800-160 MG per tablet   Oral   Take 1 tablet by mouth 2 (two) times daily.   20 tablet   0     Allergies Tramadol hcl  No family history on file.  Social History History  Substance Use Topics  . Smoking status: Current Every Day Smoker -- 1.00 packs/day    Types: Cigarettes  . Smokeless tobacco: Never Used  . Alcohol Use: No  Heroin and cocaine abuse  Review of Systems Constitutional: No fever/chills Eyes: No visual changes. ENT: No sore throat. Cardiovascular: Denies chest pain. Respiratory: Denies shortness of breath. Gastrointestinal: No abdominal pain.  No nausea, no  vomiting.  No diarrhea.  No constipation. Genitourinary: Negative for dysuria. Musculoskeletal: Negative for back pain. Skin: Negative for rash. Neurological: Negative for headaches, focal weakness or numbness.  10-point ROS otherwise negative.  ____________________________________________   PHYSICAL EXAM:  VITAL SIGNS: ED Triage Vitals  Enc Vitals Group     BP 06/22/15 1826 129/81 mmHg     Pulse Rate 06/22/15 1826 86     Resp 06/22/15 1826 18     Temp 06/22/15 1826 98.5 F (36.9 C)     Temp Source 06/22/15 1826 Oral     SpO2 06/22/15 1826 100 %     Weight 06/22/15 1826 114 lb (51.71 kg)     Height 06/22/15 1826 5\' 1"  (1.549 m)     Head Cir --      Peak Flow --      Pain Score --      Pain Loc --      Pain Edu? --      Excl. in GC? --     Constitutional: Alert and oriented. Well appearing and in no acute distress. Eyes: Conjunctivae are normal. PERRL. EOMI. Head: Atraumatic. Nose: No congestion/rhinnorhea. Mouth/Throat: Mucous membranes are moist.  Oropharynx non-erythematous. Neck: No stridor.   Cardiovascular: Normal rate, regular rhythm. Grossly normal heart sounds.  Good peripheral circulation. Respiratory: Normal respiratory effort.  No retractions. Lungs CTAB. Gastrointestinal: Soft and nontender. No distention. No abdominal bruits. No  CVA tenderness. Musculoskeletal: No lower extremity tenderness nor edema.  No joint effusions. Neurologic:  Normal speech and language. No gross focal neurologic deficits are appreciated. Speech is normal. Skin:  Skin is warm, dry and intact. No rash noted.  Track marks are present Psychiatric: Mood and affect are normal. Speech and behavior are normal.  Calm and cooperative.  She denies SI/HI  ____________________________________________   LABS (all labs ordered are listed, but only abnormal results are displayed)  Labs Reviewed  ACETAMINOPHEN LEVEL - Abnormal; Notable for the following:    Acetaminophen (Tylenol), Serum <10  (*)    All other components within normal limits  CBC - Abnormal; Notable for the following:    RDW 15.7 (*)    All other components within normal limits  COMPREHENSIVE METABOLIC PANEL - Abnormal; Notable for the following:    ALT 12 (*)    All other components within normal limits  URINALYSIS COMPLETEWITH MICROSCOPIC ([facility] ONLY) - Abnormal; Notable for the following:    Color, Urine YELLOW (*)    APPearance CLEAR (*)    Squamous Epithelial / LPF 0-5 (*)    All other components within normal limits  URINE DRUG SCREEN, QUALITATIVE ([facility] ONLY) - Abnormal; Notable for the following:    Cocaine Metabolite,Ur [place] POSITIVE (*)    Opiate, Ur Screen POSITIVE (*)    Cannabinoid 50 Ng, Ur [place] POSITIVE (*)    Benzodiazepine, Ur Scrn POSITIVE (*)    All other components within normal limits  ETHANOL  SALICYLATE LEVEL  POC URINE PREG, ED  POCT PREGNANCY, URINE   ____________________________________________  EKG  Not indicated ____________________________________________  RADIOLOGY  No results found.  ____________________________________________   PROCEDURES  Procedure(s) performed: None  Critical Care performed: No ____________________________________________   INITIAL IMPRESSION / ASSESSMENT AND PLAN / ED COURSE  Pertinent labs & imaging results that were available during my care of the patient were reviewed by me and considered in my medical decision making (see chart for details).  Margaret with behavioral medicine was working to secure her a bed at [facility] because [facility] did have 1 female bed available.  However, the patient then changed her mind and decided she would follow up in [place].  There is no indication for commitment; the patient has the capacity to make her own decisions.  ____________________________________________  FINAL CLINICAL IMPRESSION(S) / ED DIAGNOSES  Final diagnoses:  Polysubstance dependence including opioid type drug, episodic abuse, uncomplicated       NEW MEDICATIONS STARTED DURING THIS VISIT:  Discharge Medication List as of 06/22/2015  9:00 PM       Loleta Rose, MD 06/23/15 [phone]

## 2015-06-22 NOTE — Discharge Instructions (Signed)
You have been seen in the Emergency Department (ED) today for substance abuse.  We were looking into an open bed at [facility], but you stated that you prefer to follow-up in [facility] tomorrow.  Please return to the ED immediately if you have ANY thoughts of hurting yourself or anyone else, so that we may help you.  Please avoid alcohol and drug use.  Follow up with your doctor and/or therapist as soon as possible regarding today's ED  visit.   Please follow up any other recommendations and clinic appointments provided by the psychiatry team that saw you in the Emergency Department.   Finding Treatment for Alcohol and Drug Addiction It can be hard to find the right place to get professional treatment. Here are some important things to consider:  There are different types of treatment to choose from.  Some programs are live-in (residential) while others are not (outpatient). Sometimes a combination is offered.  No single type of program is right for everyone.  Most treatment programs involve a combination of education, counseling, and a 12-step, spiritually-based approach.  There are non-spiritually based programs (not 12-step).  Some treatment programs are government sponsored. They are geared for patients without private insurance.  Treatment programs can vary in many respects such as:  Cost and types of insurance accepted.  Types of on-site medical services offered.  Length of stay, setting, and size.  Overall philosophy of treatment. A person may need specialized treatment or have needs not addressed by all programs. For example, adolescents need treatment appropriate for their age. Other people have secondary disorders that must be managed as well. Secondary conditions can include mental illness, such as depression or diabetes. Often, a period of detoxification from alcohol or drugs is needed. This requires medical supervision and not all programs offer this. THINGS TO CONSIDER WHEN  SELECTING A TREATMENT PROGRAM   Is the program certified by the appropriate government agency? Even private programs must be certified and employ certified professionals.  Does the program accept your insurance? If not, can a payment plan be set up?  Is the facility clean, organized, and well run? Do they allow you to speak with graduates who can share their treatment experience with you? Can you tour the facility? Can you meet with staff?  Does the program meet the full range of individual needs?  Does the treatment program address sexual orientation and physical disabilities? Do they provide age, gender, and culturally appropriate treatment services?  Is treatment available in languages other than English?  Is long-term aftercare support or guidance encouraged and provided?  Is assessment of an individual's treatment plan ongoing to ensure it meets changing needs?  Does the program use strategies to encourage reluctant patients to remain in treatment long enough to increase the likelihood of success?  Does the program offer counseling (individual or group) and other behavioral therapies?  Does the program offer medicine as part of the treatment regimen, if needed?  Is there ongoing monitoring of possible relapse? Is there a defined relapse prevention program? Are services or referrals offered to family members to ensure they understand addiction and the recovery process? This would help them support the recovering individual.  Are 12-step meetings held at the center or is transport available for patients to attend outside meetings? In countries outside of the [place]. and [place], Magazine features editor for contact information for services in your area. Document Released: 11/08/2005 Document Revised: 03/03/2012 Document Reviewed: 05/20/2008 [facility] Patient Information 2015 [place], [place]. This information  is not intended to replace advice given to you by your health care provider. Make  sure you discuss any questions you have with your health care provider.  Chemical Dependency Chemical dependency is an addiction to drugs or alcohol. It is characterized by the repeated behavior of seeking out and using drugs and alcohol despite harmful consequences to the health and safety of ones self and others.  RISK FACTORS There are certain situations or behaviors that increase a person's risk for chemical dependency. These include:  A family history of chemical dependency.  A history of mental health issues, including depression and anxiety.  A home environment where drugs and alcohol are easily available to you.  Drug or alcohol use at a young age. SYMPTOMS  The following symptoms can indicate chemical dependency:  Inability to limit the use of drugs or alcohol.  Nausea, sweating, shakiness, and anxiety that occurs when alcohol or drugs are not being used.  An increase in amount of drugs or alcohol that is necessary to get drunk or high. People who experience these symptoms can assess their use of drugs and alcohol by asking themselves the following questions:  Have you been told by friends or family that they are worried about your use of alcohol or drugs?  Do friends and family ever tell you about things you did while drinking alcohol or using drugs that you do not remember?  Do you lie about using alcohol or drugs or about the amounts you use?  Do you have difficulty completing daily tasks unless you use alcohol or drugs?  Is the level of your work or school performance lower because of your drug or alcohol use?  Do you get sick from using drugs or alcohol but keep using anyway?  Do you feel uncomfortable in social situations unless you use alcohol or drugs?  Do you use drugs or alcohol to help forget problems? An answer of yes to any of these questions may indicate chemical dependency. Professional evaluation is suggested. Document Released: 12/04/2001 Document  Revised: 03/03/2012 Document Reviewed: 02/15/2011 [facility] Patient Information 2015 [place], [place]. This information is not intended to replace advice given to you by your health care provider. Make sure you discuss any questions you have with your health care provider.  Opioid Use Disorder Opioid use disorder is a mental disorder. It is the continued nonmedical use of opioids in spite of risks to health and well-being. Misused opioids include the street drug heroin. They also include pain medicines such as morphine, hydrocodone, oxycodone, and fentanyl. Opioids are very addictive. People who misuse opioids get an exaggerated feeling of well-being. Opioid use disorder often disrupts activities at home, work, or school. It may cause mental or physical problems.  A family history of opioid use disorder puts you at higher risk of it. People with opioid use disorder often misuse other drugs or have mental illness such as depression, posttraumatic stress disorder, or antisocial personality disorder. They also are at risk of suicide and death from overdose. SIGNS AND SYMPTOMS  Signs and symptoms of opioid use disorder include:  Use of opioids in larger amounts or over a longer period than intended.  Unsuccessful attempts to cut down or control opioid use.  A lot of time spent obtaining, using, or recovering from the effects of opioids.  A strong desire or urge to use opioids (craving).  Continued use of opioids in spite of major problems at work, school, or home because of use.  Continued use of opioids  in spite of relationship problems because of use.  Giving up or cutting down on important life activities because of opioid use.  Use of opioids over and over in situations when it is physically hazardous, such as driving a car.  Continued use of opioids in spite of a physical problem that is likely related to use. Physical problems can include:  Severe constipation.  Poor  nutrition.  Infertility.  Tuberculosis.  Aspiration pneumonia.  Infections such as human immunodeficiency virus (HIV) and hepatitis (from injecting opioids).  Continued use of opioids in spite of a mental problem that is likely related to use. Mental problems can include:  Depression.  Anxiety.  Hallucinations.  Sleep problems.  Loss of sexual function.  Need to use more and more opioids to get the same effect, or lessened effect over time with use of the same amount (tolerance).  Having withdrawal symptoms when opioid use is stopped, or using opioids to reduce or avoid withdrawal symptoms. Withdrawal symptoms include:  Depressed, anxious, or irritable mood.  Nausea, vomiting, diarrhea, or intestinal cramping.  Muscle aches or spasms.  Excessive tearing or runny nose.  Dilated pupils, sweating, or hairs standing on end.  Yawning.  Fever, raised blood pressure, or fast pulse.  Restlessness or trouble sleeping. This does not apply to people taking opioids for medical reasons only. DIAGNOSIS Opioid use disorder is diagnosed by your health care provider. You may be asked questions about your opioid use and and how it affects your life. A physical exam may be done. A drug screen may be ordered. You may be referred to a mental health professional. The diagnosis of opioid use disorder requires at least two symptoms within 12 months. The type of opioid use disorder you have depends on the number of signs and symptoms you have. The type may be:  Mild. Two or three signs and symptoms.   Moderate. Four or five signs and symptoms.   Severe. Six or more signs and symptoms. TREATMENT  Treatment is usually provided by mental health professionals with training in substance use disorders.The following options are available:  Detoxification.This is the first step in treatment for withdrawal. It is medically supervised withdrawal with the use of medicines. These medicines lessen  withdrawal symptoms. They also raise the chance of becoming opioid free.  Counseling, also known as talk therapy. Talk therapy addresses the reasons you use opioids. It also addresses ways to keep you from using again (relapse). The goals of talk therapy are to avoid relapse by:  Identifying and avoiding triggers for use.  Finding healthy ways to cope with stress.  Learning how to handle cravings.  Support groups. Support groups provide emotional support, advice, and guidance.  A medicine that blocks opioid receptors in your brain. This medicine can reduce opioid cravings that lead to relapse. This medicine also blocks the desired opioid effect when relapse occurs.  Opioids that are taken by mouth in place of the misused opioid (opioid maintenance treatment). These medicines satisfy cravings but are safer than commonly misused opioids. This often is the best option for people who continue to relapse with other treatments. HOME CARE INSTRUCTIONS   Take medicines only as directed by your health care provider.  Check with your health care provider before starting new medicines.  Keep all follow-up visits as directed by your health care provider. SEEK MEDICAL CARE IF:  You are not able to take your medicines as directed.  Your symptoms get worse. SEEK IMMEDIATE MEDICAL CARE IF:  You have serious thoughts about hurting yourself or others.  You may have taken an overdose of opioids. FOR MORE INFORMATION  National Institute on Drug Abuse: http://www.price-smith.com/www.drugabuse.gov  Substance Abuse and Mental Health Services Administration: SkateOasis.com.ptwww.samhsa.gov Document Released: 10/07/2007 Document Revised: 04/26/2014 Document Reviewed: 12/23/2013 [facility] Patient Information 2015 [place]. This information is not intended to replace advice given to you by your health care provider. Make sure you discuss any questions you have with your health care provider.

## 2015-06-22 NOTE — ED Notes (Signed)
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

## 2015-06-22 NOTE — ED Notes (Signed)

## 2015-06-23 ENCOUNTER — Encounter: Payer: Self-pay | Admitting: Emergency Medicine

## 2015-06-23 ENCOUNTER — Emergency Department
Admission: EM | Admit: 2015-06-23 | Discharge: 2015-06-23 | Disposition: A | Discharge status: Home / Self Care | Payer: Self-pay | Attending: Emergency Medicine | Admitting: Emergency Medicine

## 2015-06-23 DIAGNOSIS — F1310 Sedative, hypnotic or anxiolytic abuse, uncomplicated: Secondary | ICD-10-CM | POA: Insufficient documentation

## 2015-06-23 DIAGNOSIS — F1910 Other psychoactive substance abuse, uncomplicated: Secondary | ICD-10-CM

## 2015-06-23 DIAGNOSIS — Z720 Tobacco use: Secondary | ICD-10-CM | POA: Insufficient documentation

## 2015-06-23 DIAGNOSIS — F1110 Opioid abuse, uncomplicated: Secondary | ICD-10-CM | POA: Insufficient documentation

## 2015-06-23 DIAGNOSIS — F1210 Cannabis abuse, uncomplicated: Secondary | ICD-10-CM | POA: Insufficient documentation

## 2015-06-23 DIAGNOSIS — F1510 Other stimulant abuse, uncomplicated: Secondary | ICD-10-CM | POA: Insufficient documentation

## 2015-06-23 DIAGNOSIS — F1410 Cocaine abuse, uncomplicated: Secondary | ICD-10-CM | POA: Insufficient documentation

## 2015-06-23 HISTORY — DX: Essential (primary) hypertension: I10

## 2015-06-23 LAB — COMPREHENSIVE METABOLIC PANEL
ALBUMIN: 4.2 g/dL (ref 3.5–5.0)
ALK PHOS: 75 U/L (ref 38–126)
ALT: 13 U/L — ABNORMAL LOW (ref 14–54)
AST: 18 U/L (ref 15–41)
Anion gap: 8 (ref 5–15)
BILIRUBIN TOTAL: 0.7 mg/dL (ref 0.3–1.2)
BUN: 11 mg/dL (ref 6–20)
CALCIUM: 9.4 mg/dL (ref 8.9–10.3)
CO2: 24 mmol/L (ref 22–32)
Chloride: 108 mmol/L (ref 101–111)
Creatinine, Ser: 0.77 mg/dL (ref 0.44–1.00)
GFR calc Af Amer: >60 mL/min (ref >60)
Glucose, Bld: 87 mg/dL (ref 65–99)
Potassium: 4.2 mmol/L (ref 3.5–5.1)
SODIUM: 140 mmol/L (ref 135–145)
TOTAL PROTEIN: 8.1 g/dL (ref 6.5–8.1)

## 2015-06-23 LAB — ACETAMINOPHEN LEVEL: Acetaminophen (Tylenol), Serum: <10 ug/mL — ABNORMAL LOW (ref 10–30)

## 2015-06-23 LAB — CBC
HCT: 40.9 % (ref 35.0–47.0)
Hemoglobin: 13.2 g/dL (ref 12.0–16.0)
MCH: 27.5 pg (ref 26.0–34.0)
MCHC: 32.3 g/dL (ref 32.0–36.0)
MCV: 85.4 fL (ref 80.0–100.0)
PLATELETS: 339 10*3/uL (ref 150–440)
RBC: 4.79 MIL/uL (ref 3.80–5.20)
RDW: 15.7 % — ABNORMAL HIGH (ref 11.5–14.5)
WBC: 8 10*3/uL (ref 3.6–11.0)

## 2015-06-23 LAB — URINE DRUG SCREEN, QUALITATIVE (ARMC ONLY)
Amphetamines, Ur Screen: NOT DETECTED
BARBITURATES, UR SCREEN: NOT DETECTED
Benzodiazepine, Ur Scrn: POSITIVE — AB
Cannabinoid 50 Ng, Ur ~~LOC~~: POSITIVE — AB
Cocaine Metabolite,Ur ~~LOC~~: POSITIVE — AB
MDMA (ECSTASY) UR SCREEN: NOT DETECTED
METHADONE SCREEN, URINE: NOT DETECTED
OPIATE, UR SCREEN: POSITIVE — AB
PHENCYCLIDINE (PCP) UR S: NOT DETECTED
TRICYCLIC, UR SCREEN: POSITIVE — AB

## 2015-06-23 LAB — ETHANOL: Alcohol, Ethyl (B): <5 mg/dL (ref <5)

## 2015-06-23 LAB — SALICYLATE LEVEL

## 2015-06-23 NOTE — Discharge Instructions (Signed)
Polysubstance Abuse °When people abuse more than one drug or type of drug it is called polysubstance or polydrug abuse. For example, many smokers also drink alcohol. This is one form of polydrug abuse. Polydrug abuse also refers to the use of a drug to counteract an unpleasant effect produced by another drug. It may also be used to help with withdrawal from another drug. People who take stimulants may become agitated. Sometimes this agitation is countered with a tranquilizer. This helps protect against the unpleasant side effects. Polydrug abuse also refers to the use of different drugs at the same time.  °Anytime drug use is interfering with normal living activities, it has become abuse. This includes problems with family and friends. Psychological dependence has developed when your mind tells you that the drug is needed. This is usually followed by physical dependence which has developed when continuing increases of drug are required to get the same feeling or "high". This is known as addiction or chemical dependency. A person's risk is much higher if there is a history of chemical dependency in the family. °SIGNS OF CHEMICAL DEPENDENCY °· You have been told by friends or family that drugs have become a problem. °· You fight when using drugs. °· You are having blackouts (not remembering what you do while using). °· You feel sick from using drugs but continue using. °· You lie about use or amounts of drugs (chemicals) used. °· You need chemicals to get you going. °· You are suffering in work performance or in school because of drug use. °· You get sick from use of drugs but continue to use anyway. °· You need drugs to relate to people or feel comfortable in social situations. °· You use drugs to forget problems. °"Yes" answered to any of the above signs of chemical dependency indicates there are problems. The longer the use of drugs continues, the greater the problems will become. °If there is a family history of  drug or alcohol use, it is best not to experiment with these drugs. Continual use leads to tolerance. After tolerance develops more of the drug is needed to get the same feeling. This is followed by addiction. With addiction, drugs become the most important part of life. It becomes more important to take drugs than participate in the other usual activities of life. This includes relating to friends and family. Addiction is followed by dependency. Dependency is a condition where drugs are now needed not just to get high, but to feel normal. °Addiction cannot be cured but it can be stopped. This often requires outside help and the care of professionals. Treatment centers are listed in the yellow pages under: Cocaine, Narcotics, and Alcoholics Anonymous. Most hospitals and clinics can refer you to a specialized care center. Talk to your caregiver if you need help. °Document Released: 08/01/2005 Document Revised: 03/03/2012 Document Reviewed: 12/10/2005 °ExitCare® Patient Information ©2015 [employer], LLC. This information is not intended to replace advice given to you by your health care provider. Make sure you discuss any questions you have with your health care provider. ° °

## 2015-06-23 NOTE — ED Notes (Signed)
Pt reports that she called [facility] before coming in and they told her they have a bed there. She needs help getting off of Opioids. The last time she used was this am shot up Heroin.
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
## 2015-06-23 NOTE — ED Provider Notes (Signed)
[facility] Emergency Department Provider Note     Time seen: ----------------------------------------- 3:18 PM on 06/23/2015 -----------------------------------------    I have reviewed the triage vital signs and the nursing notes.   HISTORY  Chief Complaint Medical Clearance    HPI Denise Rubio is a 29 y.o. female who presents ER to be cleared medically to go to [facility]. Patient has no complaints, states she last used heroin this morning.   Past Medical History  Diagnosis Date  . Hypertension     There are no active problems to display for this patient.   Past Surgical History  Procedure Laterality Date  . Abscess drainage    . Tubal ligation      Allergies Tramadol hcl  Social History History  Substance Use Topics  . Smoking status: Current Every Day Smoker -- 1.00 packs/day    Types: Cigarettes  . Smokeless tobacco: Never Used  . Alcohol Use: No    Review of Systems Constitutional: Negative for fever. Eyes: Negative for visual changes. ENT: Negative for sore throat. Cardiovascular: Negative for chest pain. Respiratory: Negative for shortness of breath. Gastrointestinal: Negative for abdominal pain, vomiting and diarrhea. Genitourinary: Negative for dysuria. Musculoskeletal: Negative for back pain. Skin: Negative for rash. Neurological: Negative for headaches, focal weakness or numbness.  10-point ROS otherwise negative.  ____________________________________________   PHYSICAL EXAM:  VITAL SIGNS: ED Triage Vitals  Enc Vitals Group     BP 06/23/15 1344 120/81 mmHg     Pulse Rate 06/23/15 1344 78     Resp 06/23/15 1344 18     Temp 06/23/15 1344 97.9 F (36.6 C)     Temp Source 06/23/15 1344 Oral     SpO2 06/23/15 1344 98 %     Weight 06/23/15 1344 115 lb (52.164 kg)     Height 06/23/15 1344  (1.549 m)     Head Cir --      Peak Flow --      Pain Score --      Pain Loc --      Pain Edu? --      Excl. in  GC? --     Constitutional: Alert and oriented. Well appearing and in no distress. Eyes: Conjunctivae are normal. PERRL. Normal extraocular movements. ENT   Head: Normocephalic and atraumatic.   Nose: No congestion/rhinnorhea.   Mouth/Throat: Mucous membranes are moist.   Neck: No stridor. Hematological/Lymphatic/Immunilogical: No cervical lymphadenopathy. Cardiovascular: Normal rate, regular rhythm. Normal and symmetric distal pulses are present in all extremities. No murmurs, rubs, or gallops. Respiratory: Normal respiratory effort without tachypnea nor retractions. Breath sounds are clear and equal bilaterally. No wheezes/rales/rhonchi. Gastrointestinal: Soft and nontender. No distention. No abdominal bruits. There is no CVA tenderness. Musculoskeletal: Nontender with normal range of motion in all extremities. No joint effusions.  No lower extremity tenderness nor edema. Neurologic:  Normal speech and language. No gross focal neurologic deficits are appreciated. Speech is normal. No gait instability. Skin:  Skin is warm, dry and intact. No rash noted. Psychiatric: Mood and affect are normal. Speech and behavior are normal. Patient exhibits appropriate insight and judgment. ____________________________________________  ED COURSE:  Pertinent labs & imaging results that were available during my care of the patient were reviewed by me and considered in my medical decision making (see chart for details).  I advise that we do not do medical clearance for [facility] any longer. Stable for discharge.  ____________________________________________    LABS (pertinent positives/negatives)  Labs Reviewed  CBC - Abnormal; Notable for the following:    RDW 15.7 (*)    All other components within normal limits  COMPREHENSIVE METABOLIC PANEL - Abnormal; Notable for the following:    ALT 13 (*)    All other components within normal limits  URINE DRUG SCREEN, QUALITATIVE ([facility] ONLY) -  Abnormal; Notable for the following:    Tricyclic, Ur Screen POSITIVE (*)    Cocaine Metabolite,Ur [place] POSITIVE (*)    Opiate, Ur Screen POSITIVE (*)    Cannabinoid 50 Ng, Ur [place] POSITIVE (*)    Benzodiazepine, Ur Scrn POSITIVE (*)    All other components within normal limits  ACETAMINOPHEN LEVEL  ETHANOL  SALICYLATE LEVEL  URINE RAPID DRUG SCREEN, HOSP PERFORMED    RADIOLOGY none  ____________________________________________  FINAL ASSESSMENT AND PLAN  Polysubstance abuse  Plan: Patient advised we no longer provide medical clearance for detox. Stable for discharge   Emily FilbertWilliams, Chalsey Leeth E, MD   Emily FilbertJonathan E Mckinley Olheiser, MD 06/23/15 [phone]

## 2016-03-31 ENCOUNTER — Inpatient Hospital Stay
Admission: EM | Admit: 2016-03-31 | Discharge: 2016-04-02 | DRG: 872 | Disposition: A | Discharge status: Home / Self Care | Payer: Self-pay | Attending: Internal Medicine | Admitting: Internal Medicine

## 2016-03-31 ENCOUNTER — Encounter: Payer: Self-pay | Admitting: *Deleted

## 2016-03-31 ENCOUNTER — Emergency Department: Payer: Self-pay

## 2016-03-31 DIAGNOSIS — F17210 Nicotine dependence, cigarettes, uncomplicated: Secondary | ICD-10-CM | POA: Diagnosis present

## 2016-03-31 DIAGNOSIS — R6510 Systemic inflammatory response syndrome (SIRS) of non-infectious origin without acute organ dysfunction: Principal | ICD-10-CM | POA: Diagnosis present

## 2016-03-31 DIAGNOSIS — F419 Anxiety disorder, unspecified: Secondary | ICD-10-CM | POA: Diagnosis present

## 2016-03-31 DIAGNOSIS — I10 Essential (primary) hypertension: Secondary | ICD-10-CM | POA: Diagnosis present

## 2016-03-31 DIAGNOSIS — F1910 Other psychoactive substance abuse, uncomplicated: Secondary | ICD-10-CM | POA: Diagnosis present

## 2016-03-31 DIAGNOSIS — E876 Hypokalemia: Secondary | ICD-10-CM | POA: Diagnosis present

## 2016-03-31 DIAGNOSIS — F1990 Other psychoactive substance use, unspecified, uncomplicated: Secondary | ICD-10-CM

## 2016-03-31 DIAGNOSIS — J45909 Unspecified asthma, uncomplicated: Secondary | ICD-10-CM | POA: Diagnosis present

## 2016-03-31 DIAGNOSIS — F1110 Opioid abuse, uncomplicated: Secondary | ICD-10-CM | POA: Diagnosis present

## 2016-03-31 DIAGNOSIS — E872 Acidosis: Secondary | ICD-10-CM | POA: Diagnosis present

## 2016-03-31 DIAGNOSIS — E871 Hypo-osmolality and hyponatremia: Secondary | ICD-10-CM | POA: Diagnosis present

## 2016-03-31 HISTORY — DX: Unspecified asthma, uncomplicated: J45.909

## 2016-03-31 HISTORY — DX: Opioid abuse, uncomplicated: F11.10

## 2016-03-31 LAB — CBC WITH DIFFERENTIAL/PLATELET
BASOS ABS: 0 10*3/uL (ref 0–0.1)
Basophils Relative: 0 %
EOS PCT: 0 %
Eosinophils Absolute: 0 10*3/uL (ref 0–0.7)
HEMATOCRIT: 39.8 % (ref 35.0–47.0)
Hemoglobin: 13.4 g/dL (ref 12.0–16.0)
LYMPHS ABS: 0.5 10*3/uL — AB (ref 1.0–3.6)
LYMPHS PCT: 4 %
MCH: 29.2 pg (ref 26.0–34.0)
MCHC: 33.5 g/dL (ref 32.0–36.0)
MCV: 87.1 fL (ref 80.0–100.0)
Monocytes Absolute: 0.5 10*3/uL (ref 0.2–0.9)
Monocytes Relative: 4 %
NEUTROS ABS: 13.2 10*3/uL — AB (ref 1.4–6.5)
Neutrophils Relative %: 92 %
Platelets: 267 10*3/uL (ref 150–440)
RBC: 4.57 MIL/uL (ref 3.80–5.20)
RDW: 15.4 % — ABNORMAL HIGH (ref 11.5–14.5)
WBC: 14.3 10*3/uL — AB (ref 3.6–11.0)

## 2016-03-31 LAB — URINALYSIS COMPLETE WITH MICROSCOPIC (ARMC ONLY)
BILIRUBIN URINE: NEGATIVE
Bacteria, UA: NONE SEEN
Glucose, UA: NEGATIVE mg/dL
Hgb urine dipstick: NEGATIVE
KETONES UR: NEGATIVE mg/dL
Leukocytes, UA: NEGATIVE
Nitrite: NEGATIVE
PROTEIN: NEGATIVE mg/dL
RBC / HPF: NONE SEEN RBC/hpf (ref 0–5)
Specific Gravity, Urine: 1.01 (ref 1.005–1.030)
pH: 5 (ref 5.0–8.0)

## 2016-03-31 LAB — COMPREHENSIVE METABOLIC PANEL
ALK PHOS: 70 U/L (ref 38–126)
ALT: 13 U/L — AB (ref 14–54)
AST: 26 U/L (ref 15–41)
Albumin: 4 g/dL (ref 3.5–5.0)
Anion gap: 9 (ref 5–15)
BILIRUBIN TOTAL: 0.6 mg/dL (ref 0.3–1.2)
BUN: 7 mg/dL (ref 6–20)
CALCIUM: 8.9 mg/dL (ref 8.9–10.3)
CHLORIDE: 101 mmol/L (ref 101–111)
CO2: 22 mmol/L (ref 22–32)
CREATININE: 0.61 mg/dL (ref 0.44–1.00)
Glucose, Bld: 123 mg/dL — ABNORMAL HIGH (ref 65–99)
Potassium: 3.1 mmol/L — ABNORMAL LOW (ref 3.5–5.1)
Sodium: 132 mmol/L — ABNORMAL LOW (ref 135–145)
TOTAL PROTEIN: 8.2 g/dL — AB (ref 6.5–8.1)

## 2016-03-31 LAB — RAPID INFLUENZA A&B ANTIGENS (ARMC ONLY): INFLUENZA B (ARMC): NEGATIVE

## 2016-03-31 LAB — RAPID INFLUENZA A&B ANTIGENS: Influenza A (ARMC): NEGATIVE

## 2016-03-31 LAB — POCT PREGNANCY, URINE
PREG TEST UR: NEGATIVE
Preg Test, Ur: NEGATIVE

## 2016-03-31 LAB — SEDIMENTATION RATE: Sed Rate: 26 mm/hr — ABNORMAL HIGH (ref 0–20)

## 2016-03-31 LAB — LACTIC ACID, PLASMA: LACTIC ACID, VENOUS: 2.4 mmol/L — AB (ref 0.5–2.0)

## 2016-03-31 LAB — POCT RAPID STREP A: STREPTOCOCCUS, GROUP A SCREEN (DIRECT): NEGATIVE

## 2016-03-31 MED ORDER — ACETAMINOPHEN 325 MG PO TABS
650.0000 mg | ORAL_TABLET | Freq: Once | ORAL | Status: AC | PRN
  Administered 2016-03-31: 650 mg via ORAL
  Filled 2016-03-31: qty 2

## 2016-03-31 MED ORDER — DIAZEPAM 5 MG/ML IJ SOLN
5.0000 mg | Freq: Once | INTRAMUSCULAR | Status: AC
  Administered 2016-03-31: 5 mg via INTRAVENOUS
  Filled 2016-03-31: qty 2

## 2016-03-31 MED ORDER — SODIUM CHLORIDE 0.9 % IV SOLN
Freq: Once | INTRAVENOUS | Status: AC
  Administered 2016-03-31: 22:00:00 via INTRAVENOUS

## 2016-03-31 MED ORDER — KETOROLAC TROMETHAMINE 30 MG/ML IJ SOLN
30.0000 mg | Freq: Once | INTRAMUSCULAR | Status: AC
  Administered 2016-03-31: 30 mg via INTRAVENOUS
  Filled 2016-03-31: qty 1

## 2016-03-31 MED ORDER — METHADONE HCL 10 MG PO TABS
10.0000 mg | ORAL_TABLET | Freq: Two times a day (BID) | ORAL | Status: DC
  Administered 2016-04-01 – 2016-04-02 (×4): 10 mg via ORAL
  Filled 2016-03-31 (×3): qty 1

## 2016-03-31 MED ORDER — VANCOMYCIN HCL IN DEXTROSE 1-5 GM/200ML-% IV SOLN
1000.0000 mg | Freq: Once | INTRAVENOUS | Status: AC
  Administered 2016-03-31: 1000 mg via INTRAVENOUS
  Filled 2016-03-31: qty 200

## 2016-03-31 MED ORDER — GENTAMICIN IN SALINE 1.2-0.9 MG/ML-% IV SOLN
60.0000 mg | Freq: Once | INTRAVENOUS | Status: AC
  Administered 2016-04-01: 60 mg via INTRAVENOUS
  Filled 2016-03-31 (×2): qty 50

## 2016-03-31 NOTE — ED Provider Notes (Addendum)
[facility] Emergency Department Provider Note     Time seen: ----------------------------------------- 9:48 PM on 03/31/2016 -----------------------------------------    I have reviewed the triage vital signs and the nursing notes.   HISTORY  Chief Complaint Fever and Generalized Body Aches    HPI Denise Rubio is a 30 y.o. female who presents to ER with fevers, chills, body aches. Patient admits to IV heroin abuse and use today. Patient does not think she is in withdrawal, has had some chest pain associated with this, mostly complaining of diffuse body aches. She is planning [facility].   Past Medical History  Diagnosis Date  . Hypertension   . Heroin abuse     There are no active problems to display for this patient.   Past Surgical History  Procedure Laterality Date  . Abscess drainage    . Tubal ligation    . Tonsillectomy      Allergies Tramadol hcl  Social History Social History  Substance Use Topics  . Smoking status: Current Every Day Smoker -- 1.00 packs/day    Types: Cigarettes  . Smokeless tobacco: Never Used  . Alcohol Use: No    Review of Systems Constitutional: Positive for fevers, chills, body aches Eyes: Negative for visual changes. ENT: Negative for sore throat. Cardiovascular: Positive for chest pain Respiratory: Negative for shortness of breath. Gastrointestinal: Negative for abdominal pain, vomiting and diarrhea. Genitourinary: Negative for dysuria. Musculoskeletal: Negative for back pain. Skin: Positive for needle track marks Neurological: Negative for headaches, focal weakness or numbness.  10-point ROS otherwise negative.  ____________________________________________   PHYSICAL EXAM:  VITAL SIGNS: ED Triage Vitals  Enc Vitals Group     BP 03/31/16 2108 145/99 mmHg     Pulse Rate 03/31/16 2108 121     Resp 03/31/16 2108 18     Temp 03/31/16 2108 99.7 F (37.6  C)     Temp Source 03/31/16 2108 Oral     SpO2 03/31/16 2108 99 %     Weight --      Height --      Head Cir --      Peak Flow --      Pain Score 03/31/16 2109 10     Pain Loc --      Pain Edu? --      Excl. in GC? --     Constitutional: Alert and oriented.Mild distress Eyes: Conjunctivae are normal. PERRL. Normal extraocular movements. ENT   Head: Normocephalic and atraumatic.   Nose: No congestion/rhinnorhea.   Mouth/Throat: Mucous membranes are moist.   Neck: No stridor. Cardiovascular: Rapid rate, regular rhythm. Normal and symmetric distal pulses are present in all extremities. No murmurs, rubs, or gallops. Respiratory: Normal respiratory effort without tachypnea nor retractions. Breath sounds are clear and equal bilaterally. No wheezes/rales/rhonchi. Gastrointestinal: Soft and nontender. No distention. No abdominal bruits.  Musculoskeletal: Nontender with normal range of motion in all extremities. No joint effusions.  No lower extremity tenderness nor edema. Neurologic:  Normal speech and language. No gross focal neurologic deficits are appreciated.  Skin:  Track marks are noted in bilateral ACs. Psychiatric: Depressed mood and affect, patient tearful ____________________________________________  ED COURSE:  Pertinent labs & imaging results that were available during my care of the patient were reviewed by me and considered in my medical decision making (see chart for details). Patient likely with influenza, will check basic labs, blood cultures and chest x-ray due to her  IV drug abuse ____________________________________________    LABS (pertinent positives/negatives)  Labs Reviewed  URINALYSIS COMPLETEWITH MICROSCOPIC ([facility] ONLY) - Abnormal; Notable for the following:    Color, Urine YELLOW (*)    APPearance CLEAR (*)    Squamous Epithelial / LPF 0-5 (*)    All other components within normal limits  CBC WITH DIFFERENTIAL/PLATELET - Abnormal; Notable for  the following:    WBC 14.3 (*)    RDW 15.4 (*)    Neutro Abs 13.2 (*)    Lymphs Abs 0.5 (*)    All other components within normal limits  COMPREHENSIVE METABOLIC PANEL - Abnormal; Notable for the following:    Sodium 132 (*)    Potassium 3.1 (*)    Glucose, Bld 123 (*)    Total Protein 8.2 (*)    ALT 13 (*)    All other components within normal limits  LACTIC ACID, PLASMA - Abnormal; Notable for the following:    Lactic Acid, Venous 2.4 (*)    All other components within normal limits  SEDIMENTATION RATE - Abnormal; Notable for the following:    Sed Rate 26 (*)    All other components within normal limits  BASIC METABOLIC PANEL - Abnormal; Notable for the following:    Potassium 3.4 (*)    Calcium 7.9 (*)    Anion gap 3 (*)    All other components within normal limits  CBC - Abnormal; Notable for the following:    Hemoglobin 11.4 (*)    HCT 33.9 (*)    RDW 15.3 (*)    All other components within normal limits  GENTAMICIN LEVEL, PEAK - Abnormal; Notable for the following:    Gentamicin Pk 1.8 (*)    All other components within normal limits  GENTAMICIN LEVEL, TROUGH - Abnormal; Notable for the following:    Gentamicin Trough <0.5 (*)    All other components within normal limits  BASIC METABOLIC PANEL - Abnormal; Notable for the following:    Chloride 113 (*)    Calcium 8.5 (*)    Anion gap 1 (*)    All other components within normal limits  CULTURE, BLOOD (ROUTINE X 2)  CULTURE, BLOOD (ROUTINE X 2)  RAPID INFLUENZA A&B ANTIGENS ([facility] ONLY)  CULTURE, BLOOD (SINGLE)  LACTIC ACID, PLASMA  MAGNESIUM  POCT PREGNANCY, URINE  POCT RAPID STREP A  POC URINE PREG, ED  POCT PREGNANCY, URINE  EKG: Interpreted by me, possible lead reversal, rate is 79 bpm, normal PR interval, normal QRS, normal QT interval.  RADIOLOGY Images were viewed by me  Chest x-ray  IMPRESSION: No acute pulmonary process. ____________________________________________  FINAL ASSESSMENT AND  PLAN  SIRS, IV drug abuse  Plan: Patient with labs and imaging as dictated above. Initially my concern was for bacterial endocarditis given her IV drug abuse. I do not appreciate a murmur on examination, But I have given broad-spectrum antibiotic coverage including vancomycin and gentamicin to cover for same. I will discuss with the hospitalist for admission.   Emily FilbertWilliams, Joziyah Roblero E, MD   Emily FilbertJonathan E Loxley Schmale, MD 03/31/16 2257  Emily FilbertJonathan E Mandrell Vangilder, MD 04/11/16 1254  Emily FilbertJonathan E Sireen Halk, MD 04/12/16 [phone]

## 2016-03-31 NOTE — ED Notes (Signed)
Critical lactic acid called from lab, dr. Mayford KnifeWilliams notified.

## 2016-03-31 NOTE — ED Notes (Signed)
Pt c/o generalized body aches and fever sudden onset at 1500. Pt admits to be heroin user and used today as normal. Pt states this is not withdrawals. Pt is only seeking treatment for her illness. Pt states she went to park today and was around a large number of children. Pt c/o nausea. Pt has taken no meds for fever and body aches.

## 2016-03-31 NOTE — ED Notes (Signed)
Patient to ER for c/o body aches, fever, and cough since approx 1500 today. Patient tearful at stat desk.

## 2016-04-01 ENCOUNTER — Inpatient Hospital Stay (HOSPITAL_COMMUNITY)
Admit: 2016-04-01 | Discharge: 2016-04-01 | Disposition: A | Discharge status: Home / Self Care | Payer: Self-pay | Attending: Internal Medicine | Admitting: Internal Medicine

## 2016-04-01 ENCOUNTER — Encounter: Payer: Self-pay | Admitting: Internal Medicine

## 2016-04-01 DIAGNOSIS — I10 Essential (primary) hypertension: Secondary | ICD-10-CM | POA: Diagnosis present

## 2016-04-01 DIAGNOSIS — F1910 Other psychoactive substance abuse, uncomplicated: Secondary | ICD-10-CM | POA: Diagnosis present

## 2016-04-01 DIAGNOSIS — I349 Nonrheumatic mitral valve disorder, unspecified: Secondary | ICD-10-CM

## 2016-04-01 DIAGNOSIS — R6510 Systemic inflammatory response syndrome (SIRS) of non-infectious origin without acute organ dysfunction: Secondary | ICD-10-CM | POA: Diagnosis present

## 2016-04-01 HISTORY — DX: Systemic inflammatory response syndrome (sirs) of non-infectious origin without acute organ dysfunction: R65.10

## 2016-04-01 HISTORY — DX: Other psychoactive substance abuse, uncomplicated: F19.10

## 2016-04-01 LAB — CBC
HCT: 33.9 % — ABNORMAL LOW (ref 35.0–47.0)
Hemoglobin: 11.4 g/dL — ABNORMAL LOW (ref 12.0–16.0)
MCH: 29.7 pg (ref 26.0–34.0)
MCHC: 33.7 g/dL (ref 32.0–36.0)
MCV: 88.3 fL (ref 80.0–100.0)
PLATELETS: 228 10*3/uL (ref 150–440)
RBC: 3.83 MIL/uL (ref 3.80–5.20)
RDW: 15.3 % — ABNORMAL HIGH (ref 11.5–14.5)
WBC: 9.2 10*3/uL (ref 3.6–11.0)

## 2016-04-01 LAB — BASIC METABOLIC PANEL
Anion gap: 3 — ABNORMAL LOW (ref 5–15)
BUN: 8 mg/dL (ref 6–20)
CALCIUM: 7.9 mg/dL — AB (ref 8.9–10.3)
CO2: 23 mmol/L (ref 22–32)
CREATININE: 0.46 mg/dL (ref 0.44–1.00)
Chloride: 109 mmol/L (ref 101–111)
GFR calc Af Amer: >60 mL/min (ref >60)
GFR calc non Af Amer: >60 mL/min (ref >60)
GLUCOSE: 93 mg/dL (ref 65–99)
Potassium: 3.4 mmol/L — ABNORMAL LOW (ref 3.5–5.1)
Sodium: 135 mmol/L (ref 135–145)

## 2016-04-01 LAB — GENTAMICIN LEVEL, PEAK: Gentamicin Pk: 1.8 ug/mL — ABNORMAL LOW (ref 5.0–10.0)

## 2016-04-01 LAB — ECHOCARDIOGRAM COMPLETE
Height: 61 in
WEIGHTICAEL: 1766.4 [oz_av]

## 2016-04-01 LAB — LACTIC ACID, PLASMA: LACTIC ACID, VENOUS: 1.1 mmol/L (ref 0.5–2.0)

## 2016-04-01 MED ORDER — SODIUM CHLORIDE 0.9 % IV SOLN
INTRAVENOUS | Status: AC
  Administered 2016-04-01: 02:00:00 via INTRAVENOUS

## 2016-04-01 MED ORDER — ACETAMINOPHEN 325 MG PO TABS: 650.0000 mg | ORAL_TABLET | Freq: Four times a day (QID) | ORAL | Status: DC | PRN

## 2016-04-01 MED ORDER — ENOXAPARIN SODIUM 40 MG/0.4ML ~~LOC~~ SOLN
40.0000 mg | SUBCUTANEOUS | Status: DC
  Administered 2016-04-01 – 2016-04-02 (×2): 40 mg via SUBCUTANEOUS
  Filled 2016-04-01: qty 0.4

## 2016-04-01 MED ORDER — LORAZEPAM 2 MG/ML IJ SOLN
2.0000 mg | Freq: Once | INTRAMUSCULAR | Status: AC
  Administered 2016-04-01: 2 mg via INTRAVENOUS
  Filled 2016-04-01: qty 1

## 2016-04-01 MED ORDER — LORAZEPAM 2 MG/ML IJ SOLN
1.0000 mg | Freq: Once | INTRAMUSCULAR | Status: AC
  Administered 2016-04-01: 1 mg via INTRAVENOUS
  Filled 2016-04-01: qty 1

## 2016-04-01 MED ORDER — SODIUM CHLORIDE 0.9 % IV SOLN
INTRAVENOUS | Status: DC
  Administered 2016-04-01: 16:00:00 via INTRAVENOUS

## 2016-04-01 MED ORDER — VANCOMYCIN HCL IN DEXTROSE 750-5 MG/150ML-% IV SOLN
750.0000 mg | Freq: Two times a day (BID) | INTRAVENOUS | Status: DC
  Administered 2016-04-01 – 2016-04-02 (×3): 750 mg via INTRAVENOUS
  Filled 2016-04-01 (×4): qty 150

## 2016-04-01 MED ORDER — NICOTINE 14 MG/24HR TD PT24
14.0000 mg | MEDICATED_PATCH | Freq: Every day | TRANSDERMAL | Status: DC
  Administered 2016-04-01 – 2016-04-02 (×2): 14 mg via TRANSDERMAL
  Filled 2016-04-01 (×2): qty 1

## 2016-04-01 MED ORDER — SODIUM CHLORIDE 0.9% FLUSH
3.0000 mL | Freq: Two times a day (BID) | INTRAVENOUS | Status: DC
  Administered 2016-04-01 – 2016-04-02 (×3): 3 mL via INTRAVENOUS

## 2016-04-01 MED ORDER — ACETAMINOPHEN 650 MG RE SUPP: 650.0000 mg | Freq: Four times a day (QID) | RECTAL | Status: DC | PRN

## 2016-04-01 MED ORDER — ONDANSETRON HCL 4 MG/2ML IJ SOLN: 4.0000 mg | Freq: Four times a day (QID) | INTRAMUSCULAR | Status: DC | PRN

## 2016-04-01 MED ORDER — GENTAMICIN IN SALINE 1.2-0.9 MG/ML-% IV SOLN
60.0000 mg | Freq: Three times a day (TID) | INTRAVENOUS | Status: DC
  Administered 2016-04-02 (×2): 60 mg via INTRAVENOUS
  Filled 2016-04-01 (×6): qty 50

## 2016-04-01 MED ORDER — GENTAMICIN SULFATE 40 MG/ML IJ SOLN
40.0000 mg | Freq: Three times a day (TID) | INTRAMUSCULAR | Status: DC
  Administered 2016-04-01 (×2): 40 mg via INTRAVENOUS
  Filled 2016-04-01 (×4): qty 1

## 2016-04-01 MED ORDER — ONDANSETRON HCL 4 MG PO TABS: 4.0000 mg | ORAL_TABLET | Freq: Four times a day (QID) | ORAL | Status: DC | PRN

## 2016-04-01 NOTE — H&P (Addendum)
[facility] [place] [facility]   PATIENT NAME: Denise Rubio    MR#:  [id number]  DATE OF BIRTH:  1986-09-15  DATE OF ADMISSION:  03/31/2016  PRIMARY CARE PHYSICIAN: No primary care provider on file.   REQUESTING/REFERRING PHYSICIAN: Mayford Knife, MD  CHIEF COMPLAINT:   Chief Complaint  Patient presents with  . Fever  . Generalized Body Aches    HISTORY OF PRESENT ILLNESS:  Denise Rubio  is a 30 y.o. female who presents with Fever, malaise. Patient states that the symptoms began late in the afternoon the day she presented to the ED. She states that she is an IV heroin user. She's been using for the past 4 years. She states that initially she was using prescription opiates, but then was unable to gain further access to them and began snorting heroin, then using IV heroin. In the ED she was found to be tachycardic, febrile, with an elevated white blood cell count. Chest x-ray and UA were negative. Hospitals were called for admission due to SIRS criteria, with concern for possible bacteremia.  PAST MEDICAL HISTORY:   Past Medical History  Diagnosis Date  . Hypertension   . Heroin abuse   . Asthma     PAST SURGICAL HISTORY:   Past Surgical History  Procedure Laterality Date  . Abscess drainage    . Tubal ligation    . Tonsillectomy      SOCIAL HISTORY:   Social History  Substance Use Topics  . Smoking status: Current Every Day Smoker -- 1.00 packs/day    Types: Cigarettes  . Smokeless tobacco: Never Used  . Alcohol Use: No    FAMILY HISTORY:   Family History  Problem Relation Age of Onset  . Cancer Neg Hx    Patient states no significant contributing family history DRUG ALLERGIES:  No Known Allergies  MEDICATIONS AT HOME:   Prior to Admission medications   Not on File    REVIEW OF SYSTEMS:  Review of Systems  Constitutional: Positive for fever, chills and malaise/fatigue. Negative for weight loss.  HENT: Negative for ear pain,  hearing loss and tinnitus.   Eyes: Negative for blurred vision, double vision, pain and redness.  Respiratory: Positive for shortness of breath. Negative for cough and hemoptysis.   Cardiovascular: Negative for chest pain, palpitations, orthopnea and leg swelling.  Gastrointestinal: Negative for nausea, vomiting, abdominal pain, diarrhea and constipation.  Genitourinary: Negative for dysuria, frequency and hematuria.  Musculoskeletal: Negative for back pain, joint pain and neck pain.  Skin:       No acne, rash, or lesions  Neurological: Negative for dizziness, tremors, focal weakness and weakness.  Endo/Heme/Allergies: Negative for polydipsia. Does not bruise/bleed easily.  Psychiatric/Behavioral: Negative for depression. The patient is not nervous/anxious and does not have insomnia.      VITAL SIGNS:   Filed Vitals:   04/01/16 0000 04/01/16 0015 04/01/16 0030 04/01/16 0045  BP: 122/83  121/78   Pulse: 91 85 85 83  Temp:      TempSrc:      Resp: SpO2: 98% 99% 99% 98%   Wt Readings from Last 3 Encounters:  06/23/15 52.164 kg (115 lb)  06/22/15 51.71 kg (114 lb)  05/17/15 49.896 kg (110 lb)    PHYSICAL EXAMINATION:  Physical Exam  Vitals reviewed. Constitutional: She is oriented to person, place, and time. She appears well-developed and well-nourished. No distress.  HENT:  Head: Normocephalic and atraumatic.  Mouth/Throat:  Oropharynx is clear and moist.  Eyes: Conjunctivae and EOM are normal. Pupils are equal, round, and reactive to light. No scleral icterus.  Neck: Normal range of motion. Neck supple. No JVD present. No thyromegaly present.  Cardiovascular: Regular rhythm and intact distal pulses.  Exam reveals no gallop and no friction rub.   No murmur heard. tachycardic  Respiratory: Effort normal and breath sounds normal. No respiratory distress. She has no wheezes. She has no rales.  GI: Soft. Bowel sounds are normal. She exhibits no distension. There is no  tenderness.  Musculoskeletal: Normal range of motion. She exhibits no edema.  No arthritis, no gout  Lymphadenopathy:    She has no cervical adenopathy.  Neurological: She is alert and oriented to person, place, and time. No cranial nerve deficit.  No dysarthria, no aphasia  Skin: Skin is warm and dry. No rash noted. No erythema.  Track marks noted in multiple locations.  Psychiatric: She has a normal mood and affect. Her behavior is normal. Judgment and thought content normal.    LABORATORY PANEL:   CBC  Recent Labs Lab 03/31/16 2206  WBC 14.3*  HGB 13.4  HCT 39.8  PLT 267   ------------------------------------------------------------------------------------------------------------------  Chemistries   Recent Labs Lab 03/31/16 2206  NA 132*  K 3.1*  CL 101  CO2 22  GLUCOSE 123*  BUN 7  CREATININE 0.61  CALCIUM 8.9  AST 26  ALT 13*  ALKPHOS 70  BILITOT 0.6   ------------------------------------------------------------------------------------------------------------------  Cardiac Enzymes No results for input(s): TROPONINI in the last 168 hours. ------------------------------------------------------------------------------------------------------------------  RADIOLOGY:  Dg Chest 2 View  03/31/2016  CLINICAL DATA:  Cough, fever and chest pain today. EXAM: CHEST  2 VIEW COMPARISON:  08/21/2014 FINDINGS: The cardiomediastinal contours are normal. The lungs are clear. Pulmonary vasculature is normal. No consolidation, pleural effusion, or pneumothorax. No acute osseous abnormalities are seen. IMPRESSION: No acute pulmonary process. Electronically Signed   By: Rubye OaksMelanie  Ehinger M.D.   On: 03/31/2016 22:42    EKG:   Orders placed or performed during the hospital encounter of 03/31/16  . ED EKG  . ED EKG  . EKG 12-Lead  . EKG 12-Lead    IMPRESSION AND PLAN:  Principal Problem:   SIRS (systemic inflammatory response syndrome) (HCC) - strong concern for possible  bacteremia, with concern for possible subsequent endocarditis. Blood culture sent from the ED. IV antibiotics started subsequently. Patient's lactic acid was mildly elevated, we will continue to check this on admission in conjunction with IV fluids. We'll get an echocardiogram tomorrow. Active Problems:   IV drug abuse - with recent use. Patient states that she plans to enroll herself in a rehabilitation program on discharge. We will use by mouth methadone here to prevent withdrawal symptoms.   HTN (hypertension) - currently stable, not on meds for this at home, heart healthy diet and treat when necessary.  All the records are reviewed and case discussed with ED provider. Management plans discussed with the patient and/or family.  DVT PROPHYLAXIS: SubQ lovenox  GI PROPHYLAXIS: None  ADMISSION STATUS: Inpatient  CODE STATUS: Full Code Status History    This patient does not have a recorded code status. Please follow your organizational policy for patients in this situation.      TOTAL TIME TAKING CARE OF THIS PATIENT: 45 minutes.    Iram Astorino FIELDING 04/01/2016, 1:12 AM  [facility]  Office  [phone]  CC: Primary care physician; No primary care provider on file.

## 2016-04-01 NOTE — Progress Notes (Signed)
*  PRELIMINARY RESULTS* Echocardiogram 2D Echocardiogram has been performed.  Denise Rubio 04/01/2016, 8:35 AM

## 2016-04-01 NOTE — Progress Notes (Addendum)
Pharmacy Antibiotic Note  Denise Rubio is a 30 y.o. female admitted on 03/31/2016 with suspected endocarditis.  Pharmacy has been consulted for vancomycin and gentamicin dosing.  Plan: DW 50.1kg  Vd 35L kei 0.071 hr-1  T1/2 10 hours Vancomycin 750 mg q 12 hours ordered with stacked dosing. Level before 5th dose. Goal trough 15-20.  Gentamicin 60 mg x1 in ED. 40 mg q 8 hours ordered as continuation. Peak ordered 1 hour after 3rd dose scheduled, and trough ordered 30 minutes before 4th dose. Goal peak 3-4 and goal trough <1 for G+ synergy.  4/9 Gent peak 1.8, trough <0.5. Increasing dose to 60 mg q 8 hours. Peak and trough after 3rd new dose.  Height: 5\' 1"  (154.9 cm) Weight: 110 lb 6.4 oz (50.077 kg) IBW/kg (Calculated) : 47.8  Temp (24hrs), Avg:98.4 F (36.9 C), Min:98.1 F (36.7 C), Max:98.7 F (37.1 C)   Recent Labs Lab 03/31/16 2206 04/01/16 0132 04/01/16 0537 04/01/16 1750  WBC 14.3*  --  9.2  --   CREATININE 0.61  --  0.46  --   LATICACIDVEN 2.4* 1.1  --   --   GENTPEAK  --   --   --  1.8*    Estimated Creatinine Clearance: 79.7 mL/min (by C-G formula based on Cr of 0.46).    No Known Allergies  Antimicrobials this admission: vancomycin gentamicin >>    >>   Dose adjustments this admission:   Microbiology results: 4/8 BCx: pending   Thank you for allowing pharmacy to be a part of this patient's care.  Dray Dente S 04/01/2016 9:58 PM

## 2016-04-01 NOTE — Progress Notes (Signed)
   04/01/16 1000  Clinical Encounter Type  Visited With Patient  Visit Type Initial  Referral From Nurse  Spiritual Encounters  Spiritual Needs Emotional  Stress Factors  Patient Stress Factors Health changes  Chaplain visited with patient. Chaplain offered support as patient expressed here concerns. Chaplain will follow up with patient.

## 2016-04-01 NOTE — Progress Notes (Signed)
Patient remains stable,sinus rhythm,required ativan 1mg  x1 for anxiety,ambulating,iv antibiotics continue,normal saline continues.

## 2016-04-01 NOTE — ED Notes (Signed)
Pt assisted up to restroom to void.  

## 2016-04-01 NOTE — ED Notes (Signed)
Report to camilla, Comptrollerstudent nurse and caroline, rn.

## 2016-04-01 NOTE — Progress Notes (Signed)
Pharmacy Antibiotic Note  Denise Rubio is a 30 y.o. female admitted on 03/31/2016 with suspected endocarditis.  Pharmacy has been consulted for vancomycin and gentamicin dosing.  Plan: DW 50.1kg  Vd 35L kei 0.071 hr-1  T1/2 10 hours Vancomycin 750 mg q 12 hours ordered with stacked dosing. Level before 5th dose. Goal trough 15-20.  Gentamicin 60 mg x1 in ED. 40 mg q 8 hours ordered as continuation. Peak ordered 1 hour after 3rd dose scheduled, and trough ordered 30 minutes before 4th dose. Goal peak 3-4 and goal trough <1 for G+ synergy.  Height: 5\' 1"  (154.9 cm) Weight: 110 lb 6.4 oz (50.077 kg) IBW/kg (Calculated) : 47.8  Temp (24hrs), Avg:99.2 F (37.3 C), Min:98.7 F (37.1 C), Max:99.7 F (37.6 C)   Recent Labs Lab 03/31/16 2206 04/01/16 0132  WBC 14.3*  --   CREATININE 0.61  --   LATICACIDVEN 2.4* 1.1    Estimated Creatinine Clearance: 79.7 mL/min (by C-G formula based on Cr of 0.61).    No Known Allergies  Antimicrobials this admission: vancomycin gentamicin >>    >>   Dose adjustments this admission:   Microbiology results: 4/8 BCx: pending   Thank you for allowing pharmacy to be a part of this patient's care.  Paarth Cropper S 04/01/2016 3:18 AM

## 2016-04-01 NOTE — Progress Notes (Signed)
[facility] - [place] at [facility]   PATIENT NAME: Denise Rubio    MR#:  [id number]  DATE OF BIRTH:  [birth year]  SUBJECTIVE:  CHIEF COMPLAINT:   Chief Complaint  Patient presents with  . Fever  . Generalized Body Aches   no complaints except weakness.  REVIEW OF SYSTEMS:  CONSTITUTIONAL: No fever, has weakness.  EYES: No blurred or double vision.  EARS, NOSE, AND THROAT: No tinnitus or ear pain.  RESPIRATORY: No cough, shortness of breath, wheezing or hemoptysis.  CARDIOVASCULAR: No chest pain, orthopnea, edema.  GASTROINTESTINAL: No nausea, vomiting, diarrhea or abdominal pain.  GENITOURINARY: No dysuria, hematuria.  ENDOCRINE: No polyuria, nocturia,  HEMATOLOGY: No anemia, easy bruising or bleeding SKIN: No rash or lesion. MUSCULOSKELETAL: No joint pain or arthritis.   NEUROLOGIC: No tingling, numbness, weakness.  PSYCHIATRY: No anxiety or depression.   DRUG ALLERGIES:  No Known Allergies  VITALS:  Blood pressure 124/73, pulse 77, temperature 98.3 F (36.8 C), temperature source Oral, resp. rate 16, height 5\' 1"  (1.549 m), weight 50.077 kg (110 lb 6.4 oz), last menstrual period 03/04/2016, SpO2 100 %.  PHYSICAL EXAMINATION:  GENERAL:  30 y.o.-year-old patient lying in the bed with no acute distress.  EYES: Pupils equal, round, reactive to light and accommodation. No scleral icterus. Extraocular muscles intact.  HEENT: Head atraumatic, normocephalic. Oropharynx and nasopharynx clear. Moist oral mucosa. NECK:  Supple, no jugular venous distention. No thyroid enlargement, no tenderness.  LUNGS: Normal breath sounds bilaterally, no wheezing, rales,rhonchi or crepitation. No use of accessory muscles of respiration.  CARDIOVASCULAR: S1, S2 normal. No murmurs, rubs, or gallops.  ABDOMEN: Soft, nontender, nondistended. Bowel sounds present. No organomegaly or mass.  EXTREMITIES: No pedal edema, cyanosis, or clubbing.  NEUROLOGIC: Cranial nerves II  through XII are intact. Muscle strength 5/5 in all extremities. Sensation intact. Gait not checked.  PSYCHIATRIC: The patient is alert and oriented x 3.  SKIN: No obvious rash, lesion, or ulcer.    LABORATORY PANEL:   CBC  Recent Labs Lab 04/01/16 0537  WBC 9.2  HGB 11.4*  HCT 33.9*  PLT 228   ------------------------------------------------------------------------------------------------------------------  Chemistries   Recent Labs Lab 03/31/16 2206 04/01/16 0537  NA 132* 135  K 3.1* 3.4*  CL 101 109  CO2 22 23  GLUCOSE 123* 93  BUN 7 8  CREATININE 0.61 0.46  CALCIUM 8.9 7.9*  AST 26  --   ALT 13*  --   ALKPHOS 70  --   BILITOT 0.6  --    ------------------------------------------------------------------------------------------------------------------  Cardiac Enzymes No results for input(s): TROPONINI in the last 168 hours. ------------------------------------------------------------------------------------------------------------------  RADIOLOGY:  Dg Chest 2 View  03/31/2016  CLINICAL DATA:  Cough, fever and chest pain today. EXAM: CHEST  2 VIEW COMPARISON:  08/21/2014 FINDINGS: The cardiomediastinal contours are normal. The lungs are clear. Pulmonary vasculature is normal. No consolidation, pleural effusion, or pneumothorax. No acute osseous abnormalities are seen. IMPRESSION: No acute pulmonary process. Electronically Signed   By: Rubye OaksMelanie  Ehinger M.D.   On: 03/31/2016 22:42    EKG:   Orders placed or performed during the hospital encounter of 03/31/16  . ED EKG  . ED EKG  . EKG 12-Lead  . EKG 12-Lead    ASSESSMENT AND PLAN:   SIRS (systemic inflammatory response syndrome)  Need to rule out bacteremia, with concern for possible subsequent endocarditis. Continue vancomycin and gentamicin. Follow-up Blood culture, echocardiogram: Normal study. No valve vegetation noted.. Leukocytosis improved.  Lactic acidosis  improved.   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  Patient states that she plans to enroll herself in a rehabilitation program on discharge.  She is on by mouth methadone here to prevent withdrawal symptoms. Counseled the patient again.    HTN (hypertension). Controlled.  Hypokalemia. Given potassium supplement. Hyponatremia. Improved.  Tobacco abuse. Smoking cessation was counseled for 3 minutes, nicotine patch.  All the records are reviewed and case discussed with Care Management/Social Workerr. Management plans discussed with the patient, family and they are in agreement.  CODE STATUS: Full code  TOTAL TIME TAKING CARE OF THIS PATIENT: 37 minutes.  Greater than 50% time was spent on coordination of care and face-to-face counseling.  POSSIBLE D/C IN 2 DAYS, DEPENDING ON CLINICAL CONDITION.   Shaune Pollack M.D on 04/01/2016 at 3:30 PM  Between 7am to 6pm - Pager - [phone]  After 6pm go to [email or web address] - password [email or web address] [facility]  [place] [facility]  Office  [phone]  CC: Primary care physician; No primary care provider on file.

## 2016-04-02 LAB — BASIC METABOLIC PANEL
Anion gap: 1 — ABNORMAL LOW (ref 5–15)
BUN: 10 mg/dL (ref 6–20)
CHLORIDE: 113 mmol/L — AB (ref 101–111)
CO2: 22 mmol/L (ref 22–32)
CREATININE: 0.47 mg/dL (ref 0.44–1.00)
Calcium: 8.5 mg/dL — ABNORMAL LOW (ref 8.9–10.3)
GFR calc Af Amer: >60 mL/min (ref >60)
GFR calc non Af Amer: >60 mL/min (ref >60)
GLUCOSE: 98 mg/dL (ref 65–99)
POTASSIUM: 3.8 mmol/L (ref 3.5–5.1)
SODIUM: 136 mmol/L (ref 135–145)

## 2016-04-02 LAB — MAGNESIUM: MAGNESIUM: 2 mg/dL (ref 1.7–2.4)

## 2016-04-02 LAB — GENTAMICIN LEVEL, TROUGH

## 2016-04-02 MED ORDER — ALPRAZOLAM 0.25 MG PO TABS: 0.2500 mg | ORAL_TABLET | Freq: Every evening | ORAL | Status: DC | PRN

## 2016-04-02 MED ORDER — DOXYCYCLINE HYCLATE 50 MG PO CAPS: 50.0000 mg | ORAL_CAPSULE | Freq: Two times a day (BID) | ORAL | Status: DC

## 2016-04-02 MED ORDER — GENTAMICIN SULFATE 40 MG/ML IJ SOLN
60.0000 mg | Freq: Three times a day (TID) | INTRAVENOUS | Status: DC
  Filled 2016-04-02 (×2): qty 1.5

## 2016-04-02 MED ORDER — ALPRAZOLAM 0.25 MG PO TABS
0.2500 mg | ORAL_TABLET | Freq: Three times a day (TID) | ORAL | Status: DC | PRN
  Administered 2016-04-02: 0.25 mg via ORAL
  Filled 2016-04-02: qty 1

## 2016-04-02 MED ORDER — NICOTINE 14 MG/24HR TD PT24: 14.0000 mg | MEDICATED_PATCH | Freq: Every day | TRANSDERMAL | Status: DC

## 2016-04-02 NOTE — Discharge Summary (Signed)
[facility] - [place] at [facility]   PATIENT NAME: Denise Rubio    MR#:  [id number]  DATE OF BIRTH:  [birth year]  DATE OF ADMISSION:  03/31/2016 ADMITTING PHYSICIAN: Oralia Manis, MD  DATE OF DISCHARGE: 04/02/2016 11:50 AM  PRIMARY CARE PHYSICIAN: No primary care provider on file.    ADMISSION DIAGNOSIS:  IVDU (intravenous drug user) [F19.90] SIRS (systemic inflammatory response syndrome) (HCC) [R65.10]   DISCHARGE DIAGNOSIS:   SIRS IV drug abuse  anxiety SECONDARY DIAGNOSIS:   Past Medical History  Diagnosis Date  . Hypertension   . Heroin abuse   . Asthma     HOSPITAL COURSE:   SIRS (systemic inflammatory response syndrome)  No bacteremia per negative blood cultures for 2 days so far.  No evidence of endocarditis. She was treated wtih vancomycin and gentamicin. Follow-up Blood culture, echocardiogram: Normal study. No valve vegetation noted.. Leukocytosis improved. Since she has high risk due to iv heroin, she needs continue po doxycycline for 7 days and follow up PCP.  Lactic acidosis improved.   IV drug abuse - with recent use.  Patient states that she plans to enroll herself in a rehabilitation program on discharge.  She was on by mouth methadone here to prevent withdrawal symptoms. Counseled the patient again. Case manager arranged outpatient detox.    HTN (hypertension). Controlled without medication.  Hypokalemia. Improved with potassium supplement. Hyponatremia. Improved.  Tobacco abuse. Smoking cessation was counseled for 3 minutes, nicotine patch.  anxiety. Xanax prn.  DISCHARGE CONDITIONS:   Stable, discharge to home today.  CONSULTS OBTAINED:     DRUG ALLERGIES:  No Known Allergies  DISCHARGE MEDICATIONS:   Discharge Medication List as of 04/02/2016 10:51 AM    START taking these medications   Details  ALPRAZolam (XANAX) 0.25 MG tablet Take 1 tablet (0.25 mg total) by mouth at bedtime as needed for  anxiety., Starting 04/02/2016, Until Discontinued, Print    doxycycline (VIBRAMYCIN) 50 MG capsule Take 1 capsule (50 mg total) by mouth 2 (two) times daily., Starting 04/02/2016, Until Discontinued, Print    nicotine (NICODERM CQ - DOSED IN MG/24 HOURS) 14 mg/24hr patch Place 1 patch (14 mg total) onto the skin daily., Starting 04/02/2016, Until Discontinued, Print         DISCHARGE INSTRUCTIONS:    If you experience worsening of your admission symptoms, develop shortness of breath, life threatening emergency, suicidal or homicidal thoughts you must seek medical attention immediately by calling 911 or calling your MD immediately  if symptoms less severe.  You Must read complete instructions/literature along with all the possible adverse reactions/side effects for all the Medicines you take and that have been prescribed to you. Take any new Medicines after you have completely understood and accept all the possible adverse reactions/side effects.   Please note  You were cared for by a hospitalist during your hospital stay. If you have any questions about your discharge medications or the care you received while you were in the hospital after you are discharged, you can call the unit and asked to speak with the hospitalist on call if the hospitalist that took care of you is not available. Once you are discharged, your primary care physician will handle any further medical issues. Please note that NO REFILLS for any discharge medications will be authorized once you are discharged, as it is imperative that you return to your primary care physician (or establish a relationship with a primary care physician if you do not have  one) for your aftercare needs so that they can reassess your need for medications and monitor your lab values.    Today   SUBJECTIVE   Anxious.   VITAL SIGNS:  Blood pressure 123/81, pulse 84, temperature 98.7 F (37.1 C), temperature source Oral, resp. rate 18, height 5'  1" (1.549 m), weight 50.077 kg (110 lb 6.4 oz), last menstrual period 03/04/2016, SpO2 100 %.  I/O:   Intake/Output Summary (Last 24 hours) at 04/02/16 1516 Last data filed at 04/02/16 0827  Gross per 24 hour  Intake   1090 ml  Output      0 ml  Net   1090 ml    PHYSICAL EXAMINATION:  GENERAL:  30 y.o.-year-old patient lying in the bed with no acute distress.  EYES: Pupils equal, round, reactive to light and accommodation. No scleral icterus. Extraocular muscles intact.  HEENT: Head atraumatic, normocephalic. Oropharynx and nasopharynx clear.  NECK:  Supple, no jugular venous distention. No thyroid enlargement, no tenderness.  LUNGS: Normal breath sounds bilaterally, no wheezing, rales,rhonchi or crepitation. No use of accessory muscles of respiration.  CARDIOVASCULAR: S1, S2 normal. No murmurs, rubs, or gallops.  ABDOMEN: Soft, non-tender, non-distended. Bowel sounds present. No organomegaly or mass.  EXTREMITIES: No pedal edema, cyanosis, or clubbing.  NEUROLOGIC: Cranial nerves II through XII are intact. Muscle strength 5/5 in all extremities. Sensation intact. Gait not checked.  PSYCHIATRIC: The patient is alert and oriented x 3.  SKIN: No obvious rash, lesion, or ulcer.   DATA REVIEW:   CBC  Recent Labs Lab 04/01/16 0537  WBC 9.2  HGB 11.4*  HCT 33.9*  PLT 228    Chemistries   Recent Labs Lab 03/31/16 2206  04/02/16 0045  NA 132*  < > 136  K 3.1*  < > 3.8  CL 101  < > 113*  CO2 22  < > 22  GLUCOSE 123*  < > 98  BUN 7  < > 10  CREATININE 0.61  < > 0.47  CALCIUM 8.9  < > 8.5*  MG  --   --  2.0  AST 26  --   --   ALT 13*  --   --   ALKPHOS 70  --   --   BILITOT 0.6  --   --   < > = values in this interval not displayed.  Cardiac Enzymes No results for input(s): TROPONINI in the last 168 hours.  Microbiology Results  Results for orders placed or performed during the hospital encounter of 03/31/16  Rapid Influenza A&B Antigens ([facility] only)     Status:  None   Collection Time: 03/31/16 10:06 PM  Result Value Ref Range Status   Influenza A ([facility]) NEGATIVE NEGATIVE Final   Influenza B ([facility]) NEGATIVE NEGATIVE Final  Blood culture (routine x 2)     Status: None (Preliminary result)   Collection Time: 03/31/16 10:07 PM  Result Value Ref Range Status   Specimen Description BLOOD LEFT WRIST  Final   Special Requests BOTTLES DRAWN AEROBIC AND ANAEROBIC 3CCAERO,3CCANA  Final   Culture NO GROWTH 2 DAYS  Final   Report Status PENDING  Incomplete  Blood culture (routine x 2)     Status: None (Preliminary result)   Collection Time: 03/31/16 10:07 PM  Result Value Ref Range Status   Specimen Description BLOOD RIGHT FOREARM  Final   Special Requests   Final    BOTTLES DRAWN AEROBIC AND ANAEROBIC 9CCAERO,11CCANA   Culture NO GROWTH  2 DAYS  Final   Report Status PENDING  Incomplete  Blood culture (single)     Status: None (Preliminary result)   Collection Time: 03/31/16 10:56 PM  Result Value Ref Range Status   Specimen Description BLOOD  Final   Special Requests BOTTLES DRAWN AEROBIC AND ANAEROBIC 3CCAERO,5CCANA  Final   Culture NO GROWTH 2 DAYS  Final   Report Status PENDING  Incomplete    RADIOLOGY:  Dg Chest 2 View  03/31/2016  CLINICAL DATA:  Cough, fever and chest pain today. EXAM: CHEST  2 VIEW COMPARISON:  08/21/2014 FINDINGS: The cardiomediastinal contours are normal. The lungs are clear. Pulmonary vasculature is normal. No consolidation, pleural effusion, or pneumothorax. No acute osseous abnormalities are seen. IMPRESSION: No acute pulmonary process. Electronically Signed   By: Rubye Oaks M.D.   On: 03/31/2016 22:42        Management plans discussed with the patient, family and they are in agreement.  CODE STATUS:  Code Status History    Date Active Date Inactive Code Status Order ID Comments User Context   04/01/2016  1:46 AM 04/02/2016  2:50 PM Full Code [id number]  Oralia Manis, MD ED      TOTAL TIME TAKING CARE OF THIS  PATIENT: 32 minutes.    Shaune Pollack M.D on 04/02/2016 at 3:16 PM  Between 7am to 6pm - Pager - [phone]  After 6pm go to [email or web address] - password [email or web address] [facility]  [address] [facility]  Office  [phone]  CC: Primary care physician; No primary care provider on file.

## 2016-04-02 NOTE — Care Management (Signed)
Admitted to [facility] with the diagnosis of systemic inflammatory response syndrome. Lives with mother, [name] No income. No insurance. Lives in [place]. Seeking drug rehabilitation (inpatient). Gave information on [facility], [facility], and other substance abuse treatment centers in [place]. States she [place] go [place] [facility] [place]. Discussed that she has been to many other treatment centers in [place]. Discussed that she would need to contact [facility] and [facility] in [place] to start the admission process. Gave [name] applications to [facility] and Medication management. Sister, Eula FriedMelissa Kapp, will provide transportation. [phone]). Gwenette GreetBrenda S Talynn Lebon RN MSN CCM Care Management [phone]

## 2016-04-02 NOTE — Progress Notes (Signed)
Discharge instructions and prescriptions given with verbalized understanding.  Patient ambulated to visitors entrance with sister to be taken home in personal vehicle by sister.

## 2016-04-02 NOTE — Discharge Instructions (Signed)
Regular diet. Activity as tolerated. Outpatient detox for heroin abuse. Smoking cessation.

## 2016-04-05 LAB — CULTURE, BLOOD (SINGLE): CULTURE: NO GROWTH

## 2016-04-05 LAB — CULTURE, BLOOD (ROUTINE X 2)
Culture: NO GROWTH
Culture: NO GROWTH

## 2016-11-18 ENCOUNTER — Encounter: Payer: Self-pay | Admitting: Emergency Medicine

## 2016-11-18 ENCOUNTER — Emergency Department
Admission: EM | Admit: 2016-11-18 | Discharge: 2016-11-18 | Disposition: A | Discharge status: Home / Self Care | Payer: Medicaid Other | Attending: Emergency Medicine | Admitting: Emergency Medicine

## 2016-11-18 ENCOUNTER — Emergency Department: Payer: Medicaid Other

## 2016-11-18 DIAGNOSIS — F1290 Cannabis use, unspecified, uncomplicated: Secondary | ICD-10-CM | POA: Insufficient documentation

## 2016-11-18 DIAGNOSIS — Z79899 Other long term (current) drug therapy: Secondary | ICD-10-CM | POA: Insufficient documentation

## 2016-11-18 DIAGNOSIS — M7662 Achilles tendinitis, left leg: Secondary | ICD-10-CM | POA: Insufficient documentation

## 2016-11-18 DIAGNOSIS — J45909 Unspecified asthma, uncomplicated: Secondary | ICD-10-CM | POA: Insufficient documentation

## 2016-11-18 DIAGNOSIS — F17210 Nicotine dependence, cigarettes, uncomplicated: Secondary | ICD-10-CM | POA: Insufficient documentation

## 2016-11-18 DIAGNOSIS — I10 Essential (primary) hypertension: Secondary | ICD-10-CM | POA: Insufficient documentation

## 2016-11-18 LAB — COMPREHENSIVE METABOLIC PANEL
ALBUMIN: 4.1 g/dL (ref 3.5–5.0)
ALK PHOS: 58 U/L (ref 38–126)
ALT: 16 U/L (ref 14–54)
AST: 26 U/L (ref 15–41)
Anion gap: 9 (ref 5–15)
BILIRUBIN TOTAL: 1.1 mg/dL (ref 0.3–1.2)
BUN: 6 mg/dL (ref 6–20)
CALCIUM: 9.8 mg/dL (ref 8.9–10.3)
CO2: 26 mmol/L (ref 22–32)
CREATININE: 0.54 mg/dL (ref 0.44–1.00)
Chloride: 103 mmol/L (ref 101–111)
GFR calc Af Amer: >60 mL/min (ref >60)
GFR calc non Af Amer: >60 mL/min (ref >60)
GLUCOSE: 112 mg/dL — AB (ref 65–99)
Potassium: 3.8 mmol/L (ref 3.5–5.1)
SODIUM: 138 mmol/L (ref 135–145)
TOTAL PROTEIN: 8.3 g/dL — AB (ref 6.5–8.1)

## 2016-11-18 LAB — URIC ACID: Uric Acid, Serum: 2.9 mg/dL (ref 2.3–6.6)

## 2016-11-18 LAB — CBC WITH DIFFERENTIAL/PLATELET
BASOS PCT: 0 %
Basophils Absolute: 0 10*3/uL (ref 0–0.1)
EOS PCT: 0 %
Eosinophils Absolute: 0 10*3/uL (ref 0–0.7)
HEMATOCRIT: 40.2 % (ref 35.0–47.0)
Hemoglobin: 13.4 g/dL (ref 12.0–16.0)
Lymphocytes Relative: 11 %
Lymphs Abs: 0.9 10*3/uL — ABNORMAL LOW (ref 1.0–3.6)
MCH: 28.6 pg (ref 26.0–34.0)
MCHC: 33.3 g/dL (ref 32.0–36.0)
MCV: 85.6 fL (ref 80.0–100.0)
MONO ABS: 0.4 10*3/uL (ref 0.2–0.9)
MONOS PCT: 5 %
Neutro Abs: 6.7 10*3/uL — ABNORMAL HIGH (ref 1.4–6.5)
Neutrophils Relative %: 84 %
PLATELETS: 407 10*3/uL (ref 150–440)
RBC: 4.7 MIL/uL (ref 3.80–5.20)
RDW: 15.1 % — AB (ref 11.5–14.5)
WBC: 8 10*3/uL (ref 3.6–11.0)

## 2016-11-18 LAB — SEDIMENTATION RATE: SED RATE: 27 mm/h — AB (ref 0–20)

## 2016-11-18 MED ORDER — TRAMADOL HCL 50 MG PO TABS
50.0000 mg | ORAL_TABLET | Freq: Once | ORAL | Status: AC
  Administered 2016-11-18: 50 mg via ORAL
  Filled 2016-11-18: qty 1

## 2016-11-18 MED ORDER — NAPROXEN 500 MG PO TABS: 500.0000 mg | ORAL_TABLET | Freq: Two times a day (BID) | ORAL | Status: DC

## 2016-11-18 MED ORDER — KETOROLAC TROMETHAMINE 30 MG/ML IJ SOLN
30.0000 mg | Freq: Once | INTRAMUSCULAR | Status: AC
  Administered 2016-11-18: 30 mg via INTRAVENOUS
  Filled 2016-11-18: qty 1

## 2016-11-18 MED ORDER — TRAMADOL HCL 50 MG PO TABS: 50.0000 mg | ORAL_TABLET | Freq: Four times a day (QID) | ORAL | 0 refills | Status: AC | PRN

## 2016-11-18 MED ORDER — NAPROXEN 500 MG PO TABS
500.0000 mg | ORAL_TABLET | Freq: Once | ORAL | Status: AC
  Administered 2016-11-18: 500 mg via ORAL
  Filled 2016-11-18: qty 1

## 2016-11-18 NOTE — Discharge Instructions (Signed)
Wear splint and bili with crutches until evaluation by GI.

## 2016-11-18 NOTE — ED Triage Notes (Signed)
Pt reports swelling and pain to left foot that started the day before Thanksgiving. Denies injury

## 2016-11-18 NOTE — ED Provider Notes (Signed)
[facility] Emergency Department Provider Note   ____________________________________________   None    (approximate)  I have reviewed the triage vital signs and the nursing notes.   HISTORY  Chief Complaint Foot Pain    HPI Denise Rubio is a 30 y.o. female patient complaining of 4 days of ankle and heel pain. Patient denies history of trauma or any other provocative incident. Patient stated pain increases with weightbearing on the heel. Patient states she's been walking on her toes secondary to the pain.Patient rates the pain as a 9/10. Patient described the pain as "sharp". Nonweightbearing decreases the pain. Patient denies any fever or chills. Patient has a history of IV drug usage. Patient recently admitted and discharged from this hospital for systemic inflammatory response syndrome.  No palliative measures for his complaint. Past Medical History:  Diagnosis Date  . Asthma   . Heroin abuse   . Hypertension     Patient Active Problem List   Diagnosis Date Noted  . SIRS (systemic inflammatory response syndrome) (HCC) 04/01/2016  . IV drug abuse 04/01/2016  . HTN (hypertension) 04/01/2016    Past Surgical History:  Procedure Laterality Date  . ABSCESS DRAINAGE    . TONSILLECTOMY    . TUBAL LIGATION      Prior to Admission medications   Medication Sig Start Date End Date Taking? Authorizing Provider  ALPRAZolam (XANAX) 0.25 MG tablet Take 1 tablet (0.25 mg total) by mouth at bedtime as needed for anxiety. 04/02/16   Shaune PollackQing Chen, MD  doxycycline (VIBRAMYCIN) 50 MG capsule Take 1 capsule (50 mg total) by mouth 2 (two) times daily. 04/02/16   Shaune PollackQing Chen, MD  naproxen (NAPROSYN) 500 MG tablet Take 1 tablet (500 mg total) by mouth 2 (two) times daily with a meal. 11/18/16   Joni Reiningonald K Smith, PA-C  nicotine (NICODERM CQ - DOSED IN MG/24 HOURS) 14 mg/24hr patch Place 1 patch (14 mg total) onto the skin daily. 04/02/16   Shaune PollackQing Chen, MD  traMADol (ULTRAM)  50 MG tablet Take 1 tablet (50 mg total) by mouth every 6 (six) hours as needed. 11/18/16 11/18/17  Joni Reiningonald K Smith, PA-C    Allergies Patient has no known allergies.  Family History  Problem Relation Age of Onset  . Cancer Neg Hx     Social History Social History  Substance Use Topics  . Smoking status: Current Every Day Smoker    Packs/day: 1.00    Types: Cigarettes  . Smokeless tobacco: Never Used  . Alcohol use No    Review of Systems Constitutional: No fever/chills Eyes: No visual changes. ENT: No sore throat. Cardiovascular: Denies chest pain. Respiratory: Denies shortness of breath. Gastrointestinal: No abdominal pain.  No nausea, no vomiting.  No diarrhea.  No constipation. Genitourinary: Negative for dysuria. Musculoskeletal: Negative for back pain. Skin: Negative for rash. Neurological: Negative for headaches, focal weakness or numbness. Psychiatric:Drug abuse Endocrine:Hypertension Allergic/Immunilogical:  SIRS_________________________________________   PHYSICAL EXAM:  VITAL SIGNS: ED Triage Vitals  Enc Vitals Group     BP 11/18/16 1429 136/84     Pulse Rate 11/18/16 1429 82     Resp 11/18/16 1429 20     Temp 11/18/16 1429 97.6 F (36.4 C)     Temp Source 11/18/16 1429 Oral     SpO2 11/18/16 1429 100 %     Weight 11/18/16 1428 105 lb (47.6 kg)     Height 11/18/16 1428 5\' 1"  (1.549 m)     Head Circumference --  Peak Flow --      Pain Score 11/18/16 1428 9     Pain Loc --      Pain Edu? --      Excl. in GC? --     Constitutional: Alert and oriented. Well appearing and in no acute distress. Eyes: Conjunctivae are normal. PERRL. EOMI. Head: Atraumatic. Nose: No congestion/rhinnorhea. Mouth/Throat: Mucous membranes are moist.  Oropharynx non-erythematous. Neck: No stridor.  No cervical spine tenderness to palpation. Hematological/Lymphatic/Immunilogical: No cervical lymphadenopathy. Cardiovascular: Normal rate, regular rhythm. Grossly  normal heart sounds.  Good peripheral circulation. Respiratory: Normal respiratory effort.  No retractions. Lungs CTAB. Gastrointestinal: Soft and nontender. No distention. No abdominal bruits. No CVA tenderness. Musculoskeletal: No DEFORMITY to the left foot or ankle. Mild edema and erythema. Patient has increased guarding with palpation of the heel especially plantar aspect. Neurologic:  Normal speech and language. No gross focal neurologic deficits are appreciated. No gait instability. Skin:  Skin is warm, dry and intact. No rash noted. Psychiatric: Mood and affect are normal. Speech and behavior are normal.  ____________________________________________   LABS (all labs ordered are listed, but only abnormal results are displayed)  Labs Reviewed  COMPREHENSIVE METABOLIC PANEL - Abnormal; Notable for the following:       Result Value   Glucose, Bld 112 (*)    Total Protein 8.3 (*)    All other components within normal limits  CBC WITH DIFFERENTIAL/PLATELET - Abnormal; Notable for the following:    RDW 15.1 (*)    Neutro Abs 6.7 (*)    Lymphs Abs 0.9 (*)    All other components within normal limits  SEDIMENTATION RATE - Abnormal; Notable for the following:    Sed Rate 27 (*)    All other components within normal limits  URIC ACID   ____________________________________________  EKG   ____________________________________________  RADIOLOGY  X-ray findings consistent with Achilles tendinitis ____________________________________________   PROCEDURES  Procedure(s) performed: None  Procedures  Critical Care performed: No  ____________________________________________   INITIAL IMPRESSION / ASSESSMENT AND PLAN / ED COURSE  Pertinent labs & imaging results that were available during my care of the patient were reviewed by me and considered in my medical decision making (see chart for details).  Left Achilles tendinitis. Patient given discharge care instructions.  Patient patient OCL ankle splint. Patient given a prescription for naproxen and tramadol. Patient advised follow-up with podiatry. for definitive evaluation and treatment.  Clinical Course      ____________________________________________   FINAL CLINICAL IMPRESSION(S) / ED DIAGNOSES  Final diagnoses:  Achilles tendinitis, left leg      NEW MEDICATIONS STARTED DURING THIS VISIT:  New Prescriptions   NAPROXEN (NAPROSYN) 500 MG TABLET    Take 1 tablet (500 mg total) by mouth 2 (two) times daily with a meal.   TRAMADOL (ULTRAM) 50 MG TABLET    Take 1 tablet (50 mg total) by mouth every 6 (six) hours as needed.     Note:  This document was prepared using Dragon voice recognition software and may include unintentional dictation errors.    Joni Reiningonald K Smith, PA-C 11/18/16 1631    Jene Everyobert Kinner, MD 11/18/16 [phone]

## 2016-11-18 NOTE — ED Notes (Signed)
See triage note  States she developed pain to left foot last weds  Denies any injury. Min swelling noted  States pain is post heel area  Increased pain with standing and touch

## 2017-08-05 ENCOUNTER — Ambulatory Visit
Admission: RE | Admit: 2017-08-05 | Discharge: 2017-08-05 | Disposition: A | Discharge status: Home / Self Care | Payer: MEDICAID | Attending: Sports Medicine | Admitting: Sports Medicine

## 2017-08-05 DIAGNOSIS — B182 Chronic viral hepatitis C: Principal | ICD-10-CM

## 2017-09-03 ENCOUNTER — Ambulatory Visit
Admission: RE | Admit: 2017-09-03 | Discharge: 2017-09-03 | Disposition: A | Discharge status: Home / Self Care | Payer: MEDICAID | Attending: Physician Assistant | Admitting: Physician Assistant

## 2017-09-03 DIAGNOSIS — B182 Chronic viral hepatitis C: Principal | ICD-10-CM

## 2017-10-08 ENCOUNTER — Ambulatory Visit
Admission: RE | Admit: 2017-10-08 | Discharge: 2017-10-08 | Disposition: A | Discharge status: Home / Self Care | Payer: MEDICAID | Attending: Gastroenterology | Admitting: Gastroenterology

## 2017-10-08 DIAGNOSIS — B182 Chronic viral hepatitis C: Principal | ICD-10-CM

## 2017-10-08 LAB — CBC
HEMATOCRIT: 42.8 % (ref 36.0–46.0)
HEMOGLOBIN: 13.2 g/dL (ref 12.0–16.0)
MEAN CORPUSCULAR HEMOGLOBIN CONC: 30.8 g/dL — ABNORMAL LOW (ref 31.0–37.0)
MEAN CORPUSCULAR HEMOGLOBIN: 27.3 pg (ref 26.0–34.0)
MEAN CORPUSCULAR VOLUME: 88.6 fL (ref 80.0–100.0)
MEAN PLATELET VOLUME: 9.3 fL (ref 7.0–10.0)
PLATELET COUNT: 344 10*9/L (ref 150–440)
RED BLOOD CELL COUNT: 4.82 10*12/L (ref 4.00–5.20)
RED CELL DISTRIBUTION WIDTH: 14.6 % (ref 12.0–15.0)
WBC ADJUSTED: 12.1 10*9/L — ABNORMAL HIGH (ref 4.5–11.0)

## 2017-10-08 LAB — IRON & TIBC
IRON: 49 ug/dL (ref 35–165)
TRANSFERRIN: 428.5 mg/dL — ABNORMAL HIGH (ref 200.0–380.0)

## 2017-10-08 LAB — COMPREHENSIVE METABOLIC PANEL
ALBUMIN: 4.2 g/dL (ref 3.5–5.0)
ANION GAP: 10 mmol/L (ref 9–15)
AST (SGOT): 71 U/L — ABNORMAL HIGH (ref 14–38)
BILIRUBIN TOTAL: 1 mg/dL (ref 0.0–1.2)
BLOOD UREA NITROGEN: 8 mg/dL (ref 7–21)
BUN / CREAT RATIO: 15
CALCIUM: 9.9 mg/dL (ref 8.5–10.2)
CHLORIDE: 107 mmol/L (ref 98–107)
CO2: 27 mmol/L (ref 22.0–30.0)
CREATININE: 0.54 mg/dL — ABNORMAL LOW (ref 0.60–1.00)
EGFR MDRD AF AMER: >=60 mL/min/{1.73_m2} (ref >=60)
EGFR MDRD NON AF AMER: >=60 mL/min/{1.73_m2} (ref >=60)
GLUCOSE RANDOM: 85 mg/dL (ref 65–179)
POTASSIUM: 4.7 mmol/L (ref 3.5–5.0)
SODIUM: 144 mmol/L (ref 135–145)

## 2017-10-08 LAB — PLATELET COUNT: Lab: 344

## 2017-10-08 LAB — TRANSFERRIN: Transferrin:MCnc:Pt:Ser/Plas:Qn:: 428.5 — ABNORMAL HIGH

## 2017-10-08 LAB — FERRITIN: Ferritin:MCnc:Pt:Ser/Plas:Qn:: 9.2

## 2017-10-08 LAB — THYROID STIMULATING HORMONE: Thyrotropin:ACnc:Pt:Ser/Plas:Qn:: 0.505 — ABNORMAL LOW

## 2017-10-08 LAB — CERULOPLASMIN: Ceruloplasmin:MCnc:Pt:Ser/Plas:Qn:: 35.7

## 2017-10-08 LAB — SODIUM: Sodium:SCnc:Pt:Ser/Plas:Qn:: 144

## 2017-10-08 NOTE — Unmapped (Addendum)
[facility]  FAST ??? Fibrosis Assessment Team  Division of Gastroenterology and Hepatology  ??  ??    ??  FIBROSCAN will be performed to assess hepatic fibrosis (scarring) in order to stage this patient's liver disease. This will assist with evaluating the natural course of the disease and will provide important information regarding prognosis, duration of therapy, and potential response to treatment. This information will also help assess risk for hepatocellular carcinoma and need for liver cancer surveillance.??  ??  FibroscanProcedure:   After obtaining verbal consent, the patient was placed in a supine position. Physical characteristics and landmarks were assessed to establish appropriate mid-axillary intercostal space for probe placement. 50Hz  Shear Wave pulses were applied and the resulting Shear Wave and Propagation Speed detected with a 3.5 MHz ultrasonic signal, using the FibroScan probe.  Skin to liver capsule distance and liver parenchyma were accessed during the entire examination with the FibroScan probe. The patient was instructed to breathe normally and to abstain from sudden movements during the procedure, allowing for random measurements of liver stiffness. At least ten Shear Waves were produced; individual measurements of each Shear Wave were calculated. Patient tolerated the procedure well and was discharged without incident.  ??  Probe used []  M+    Serial # [id number]                         [x]  XL+   Serial # [id number]  ??  Main Etiology of Liver Disease:  [x] HCV     [] HBV   [] Alcohol    [] NASH  [] PBC      [] PSC     [] Other________________  ??  50Hz  shear wave pulses were applied and the resulting shear wave and propagation speed detected with a 3.5 MHz ultrasonic signal, using the FibroScan probe.     ??  At least ten Shear Waves were produced; individual measurements of each shear wave were calculated.    ??  Patient tolerated the procedure well and was discharged without incident.  ??  Fibroscan score: ___5.2___kPa  ??  IQR:                       ___15___%  ??  Test performed by: Luiz Ochoa, RN  ??  [facility]  FAST ??? Fibrosis Assessment Team  Division of Gastroenterology and Hepatology  ??  ??    ??  ??  ??  Estimation of the stage of liver fibrosis (Metavir Score):  The results of the Liver Stiffness Score are consistent with the following liver fibrosis stage:  ??  ??  [x]  F0-F1             []  F2               []  F3               []  F4  ??  ??  GENERAL RECOMMENDATIONS ACCORDING TO THE STAGE OF LIVER FIBROSIS.  ??  F0-F1: No-minimal fibrosis. The risk of progression to advanced fibrosis and cirrhosis is low. If the cause of liver disease is not removed, a 1-2 yr follow-up study is recommended.   F2: Significant fibrosis. There is a moderate risk of progression to cirrhosis. If the cause of liver disease is not removed, a follow-up study in 12 months is recommended.  F3: Advanced (pre-cirrhotic stage). The risk of progression to cirrhosis is high. Imaging studies to rule out hepatocellular carcinoma  should be considered. Efforts to remove the cause of liver disease are highly recommended.  F4: Cirrhosis. There is significant risk of portal hypertension and esophageal varices. An upper endoscopy is recommended. Imaging studies for hepatocellular carcinoma screening are recommended.   ??  Any and all FibroScan studies must be carefully evaluated, taking fully into account all individual measurement/scans, patient history and other factors.  As with liver biopsy, any estimation of liver fibrosis may be subject to under or over staging due to sampling error.  Any further medical or surgical intervention should be made only while fully considering the circumstances of this patient and in consultation with this patient.    I have reviewed and interpreted the FIBROSCAN test results as described above.  ??  Alba Destine, M.D.  Professor of Medicine  Director, [facility]  [place] [facility] [place] at [place] [phone]

## 2017-10-08 NOTE — Unmapped (Signed)
[facility]    Lori Bowers, M.D.  Professor of Medicine  Director, [facility]  [place] [facility] [place] at [place]    [phone]    Albertha Ghee, MD  [address]  [place], [place] [address]     Chief complaint: Patient is referred for consultation for chronic hepatitis C genotype 3    Present illness: Patient is a 31 y.o. Caucasian female with chronic hepatitis C genotype 3. The patient states she was diagnosed in May 2018 when she entered detox program. Interestingly she states that she was negative for hepatitis C in December 2017. Her risk factors a period of injecting drug use for about 7 years which stopped 6 months ago. She never had acute icteric hepatitis. Overall she feels well except for some fatigue and sluggish feeling.    10 system review of systems is negative except as noted above.    Past medical history:  1. History of depression well-controlled on current medications  2. Status post tubal ligation  3. Possible thyroid disease in the past but she does not recall any details  4. Immune to hepatitis B (hepatitis B surface antigen negative, anti-HBs positive)  5. No immunity to hepatitis A: First dose of hepatitis A vaccine A she may progress or 09/2017.  6. No history of diabetes, hypertension, heart disease, or lung disease.  7. 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 escitalopram oxalate (LEXAPRO) 10 MG tablet Take 10 mg by mouth daily.     ??? QUEtiapine (SEROQUEL) 50 MG tablet Take 50 mg by mouth nightly.     ??? gabapentin (NEURONTIN) 300 MG capsule Take 300 mg by mouth Three (3) times a day.     ??? ibuprofen (ADVIL,MOTRIN) 600 MG tablet Take 1 tablet (600 mg total) by mouth every six (6) hours as needed for pain. (Patient not taking: Reported on 10/08/2017) 60 tablet 2     Current Facility-Administered Medications   Medication Dose Route Frequency Provider Last Rate Last Dose   ??? levonorgestrel (MIRENA) 20 mcg/24 hr (5 years) IUD 1 kit  1 each Intrauterine Once Lucianne Sullivan Lone, MD           Social history: The patient has been abstinent from drugs for 6 months. She lives with her father. She has 2 sons ages 78 and 8 that were born prior to any risk factors for hepatitis C according to the patient. She does not drink any alcohol. She smokes half pack cigarettes per day. She is not currently working.    Family history: Her brother has hepatitis C. There is no history of liver cancer.    BP 120/87  - Pulse 84  - Temp 36.8 ??C (98.2 ??F) (Oral)  - Resp 14  - Ht 154.9 cm (5' 0.98)  - Wt 62.6 kg (137 lb 14.4 oz)  - LMP 10/08/2017 (Exact Date) Comment: IUD - SpO2 99%  - BMI 26.07 kg/m??     Pleasant individual in NAD    HEENT: Sclera are anicteric, no temporal muscle loss, oropharynx is negative  NECK: No thyromegaly or lymphadenopathy, No carotid bruits  Chest: Clear to auscultation and percussion  Heart: S1, S2, RR, No murmurs  Abdomen: Soft, non-tender, non-distended, no hepatosplenomegaly, no masses appreciated, no ascites  Skin: No spider angiomata, No rashes  Extremities: Without pedal edema, multiple tattoospalmar erythema  Neuro: Grossly intact, No focal deficits    Results for orders placed or performed in visit on 10/08/17   CBC   Result Value Ref Range    WBC 12.1 (H) 4.5 - 11.0 10*9/L    RBC 4.82 4.00 - 5.20 10*12/L    HGB 13.2 12.0 - 16.0 g/dL    HCT 16.1 09.6 - 04.5 %    MCV 88.6 80.0 - 100.0 fL    MCH 27.3 26.0 - 34.0 pg    MCHC 30.8 (L) 31.0 - 37.0 g/dL    RDW 40.9 81.1 - 91.4 %    MPV 9.3 7.0 - 10.0 fL    Platelet 344 150 - 440 10*9/L   Comprehensive Metabolic Panel   Result Value Ref Range    Sodium 144 135 - 145 mmol/L    Potassium 4.7 3.5 - 5.0 mmol/L    Chloride 107 98 - 107 mmol/L    CO2 27.0 22.0 - 30.0 mmol/L    BUN 8 7 - 21 mg/dL    Creatinine 7.82 (L) 0.60 - 1.00 mg/dL    BUN/Creatinine Ratio 15     EGFR MDRD Non Af Amer >=60 >=60 mL/min/1.52m2    EGFR MDRD Af Amer >=60 >=60 mL/min/1.71m2    Anion Gap 10 9 - 15 mmol/L    Glucose 85 65 - 179 mg/dL    Calcium 9.9 8.5 - 95.6 mg/dL    Albumin 4.2 3.5 - 5.0 g/dL    Total Protein 8.2 6.5 - 8.3 g/dL    Total Bilirubin 1.0 0.0 - 1.2 mg/dL    AST 71 (H) 14 - 38 U/L    ALT 94 (H) 15 - 48 U/L    Alkaline Phosphatase 100 38 - 126 U/L   TSH   Result Value Ref Range    TSH 0.505 (L) 0.600 - 3.300 uIU/mL   Ceruloplasmin   Result Value Ref Range    Ceruloplasmin 35.7 15.0 - 52.0 mg/dL   Iron Level and TIBC   Result Value Ref Range    Iron 49 35 - 165 ug/dL    TIBC 213.0 (H) 865.7 - 479.0 mg/dL    Transferrin 846.9 (H) 200.0 - 380.0 mg/dL    Iron Saturation (%) 9 (L) 15 - 50 %   Ferritin   Result Value Ref Range    Ferritin 9.2 3.0 - 151.0 ng/mL   Hepatitis C RNA, Quantitative, PCR   Result Value Ref Range    HCV RNA      HCV RNA (IU) 629,528 (H) <=0 IU/mL    HCV RNA Log(10) 5.66 (H) <0.00 log IU/mL    HCV RNA Comment       Hepatitis C virus RNA quantification is performed using the FDA-approved Abbott RealTime PCR HCV test, targeting the 5' UTR. This test can quantify HCV RNA over the range of 12-100,000,000 IU/mL (1.08 log(10) - 8.0 log(10) IU/mL). Both the limit of detection and limit of quantification are 12 IU/mL. The reference range for this assay is Not Detected.         Impression:  1. Chronic hepatitis C genotype 3. Patient likely has mild disease based upon her clinical status, laboratory data, and FIBROSCAN results.  Today we discussed the natural history of HCV infection, including the risks for progression to cirrhosis, liver failure, liver cancer, and risks of hepatocellular carcinoma. Patient is aware of the need for continued follow-up and monitoring.  We discussed the  importance of remaining abstinent from alcohol due to additive effects on disease progression to cirrhosis.  We discussed potential treatment options that include all-oral regimens with low rates of side effects and high rates of cure (sustained virological response). We discussed that there is risk of reinfection if she returns to using injecting drug use after hepatitis C was cured.    Given the relatively recent infection and the low viral load at the time of last testing, we will repeat HCV RNA to determine whether or not this has remained is a chronic infection. ADDENDUM: HCV RNA IS 461,000 IU INDICATING CHRONIC INFECTION    The patient will be good candidate for treatment with medications of high rate of cure with low risk of side effects. She will see our pharmacist, Erskine Squibb, to review treatment options.    2. Immunity hepatitis A and hepatitis B: Patient is immune to hepatitis B. She'll received first dose of hepatitis A vaccine patient today. Repeat in 6 months.    3. History of possible thyroid disease: Check TSH today    The patient return for follow-up in 3 months or sooner once hepatitis C medications have been started to see Owens Shark, DNP.    Lori Bowers, M.D.  Professor of Medicine  Director, [facility]  [place] of [place] at [place]    [phone]

## 2017-10-08 NOTE — Unmapped (Signed)
Counseling for HCV treatment     B18.2 Hep C: yes    K74.60 Cirrhosis: no,   Child Pugh Score if applicable and for Medicaid pts: n/a  Z94.4 Liver Transplant: no    Genotype: 3 (09/03/17 labs)  HCV RNA: 1,140 (08/05/17 labs)  Fibrosis score: pending Fibroscan on 10/08/17  HIV Co-infection? unknown  Signs of liver decompensation? no  Previous treatment? no    Planned regimen: Mavyret (glecaprevir/pibrentasvir 100/40 mg) 3 tabs daily x 8 weeks  Urgency: Routine Request    Prescribing Provider/NPI: Dr. Gavin Potters / [id number]  Insurance: none    Lori Bowers is 31 y.o. Caucasian female who presents to clinic with her niece and great niece and is interested in starting treatment with Mavyret. We discussed that since the patient was recently diagnosed with HCV in May 2018 and that her viral load was low, we will check her viral load today to see if she has spontaneously cleared the infection. If she has spontaneously cleared it, then no treatment is needed, but if not, we will proceed with Mavyret. We then discussed that if she requires treatment, she will need to apply for manufacturer assistance and was provided with the paperwork for this. We also recommended that the patient's partner get tested for HCV.    Current medications:  Current Outpatient Prescriptions   Medication Sig Dispense Refill   ??? escitalopram oxalate (LEXAPRO) 10 MG tablet Take 10 mg by mouth daily.     ??? ibuprofen (ADVIL,MOTRIN) 600 MG tablet Take 1 tablet (600 mg total) by mouth every six (6) hours as needed for pain. (Patient not taking: Reported on 10/08/2017) 60 tablet 2     Current Facility-Administered Medications   Medication Dose Route Frequency Provider Last Rate Last Dose   ??? levonorgestrel (MIRENA) 20 mcg/24 hr (5 years) IUD 1 kit  1 each Intrauterine Once Lucianne Sullivan Lone, MD             Following topics were discussed during counseling:    1. Indications for medication, dosage and administration.     A. Mavyret (100/40 mg) 3 tablets to take daily with food.    2. Common side effects of medications and management strategies. (fatigue, headache)     A. Pregnancy precaution: patient had tubal ligation and Mirena placed 08/05/17.    3. Importance of adherence to regimen, follow-up clinic visits and lab monitoring.     4. Drug-drug interaction.    A. Current medications have been reviewed and assessed for possible interaction. Patient reports no longer taking quetiapine, gabapentin, or ibuprofen. Denies use of herbal medication such as milk thistle or St. John's wort.  Allergies have been verified. Denies any alcohol use.   5. Importance of informing pharmacy and clinic of updated contact information.  Discussed the process of obtaining medication through specialty pharmacy and when approved medication will be delivered to patient's home.     Patient verbalized understanding. Provided contact information for any questions/concerns.     Arvilla Market, PharmD Candidate    Park Breed, Pharm D., BCPS, BCGP, CPP  [facility]  [address]  [place], [place] [address]  [phone]    This portion of the visit was 15 minutes in duration and greater than 50% was spend in direct counseling and coordination of care regarding hepatitis C medication management.

## 2017-10-08 NOTE — Unmapped (Addendum)
Chronic hepatitis C type 3. You will be a good candidate for treatment with medications that have a high rate of cure and a low risk of side effects. You will meet our pharmacist Erskine Squibb to discuss starting treatment for hepatitis C.Fibroscan today to evaluate the extent of scarring of the liver.  You have immunity to hepatitis B. We will give first vaccination for hepatitis A. Repeat in 6 months. You will be scheduled for a liver ultrasound. Return to office in 3 months or sooner once hepatitis C medications has been started to see Lori Shark, DNP

## 2017-10-10 LAB — HEPATITIS C RNA, QUANTITATIVE, PCR: HCV RNA(IU): 461828 [IU]/mL — ABNORMAL HIGH (ref <=0)

## 2017-10-10 LAB — HCV RNA: Hepatitis C virus RNA:PrThr:Pt:Ser/Plas:Ord:Probe.amp.tar: 0

## 2017-10-11 NOTE — Unmapped (Signed)
I have reviewed and interpreted the FIBROSCAN test results as described above.    Alba Destine, M.D.  Professor of Medicine  Director, [facility]  [place] of [place] at [facility]

## 2017-10-14 NOTE — Unmapped (Signed)
Reached out to pt to review her labs from clinic visit on 10/08/17. (HCV RNA IS 461,000 IU INDICATING CHRONIC INFECTION.) Left message with pt's father to return my call.

## 2017-10-15 MED ORDER — GLECAPREVIR 100 MG-PIBRENTASVIR 40 MG TABLET
ORAL_TABLET | Freq: Every day | ORAL | 1 refills | 0.00000 days | Status: CP
Start: 2017-10-15 — End: 2018-10-15

## 2017-10-15 MED ORDER — GLECAPREVIR 100 MG-PIBRENTASVIR 40 MG TABLET: tablet | 1 refills | 0 days

## 2017-10-15 NOTE — Unmapped (Signed)
Reviewed HCV RNA result from 10/08/17 of 461,838 IU/ml which indicates chronic infection. Advised will need to proceed with treatment and will need her to fill out the manufacturer assistance form which was given to her during clinic visit. Pt reports death of her brother (overdose) but will be working on her application next week. Offered condolences.

## 2017-10-29 ENCOUNTER — Ambulatory Visit: Admission: RE | Admit: 2017-10-29 | Discharge: 2017-10-29 | Disposition: A | Discharge status: Home / Self Care

## 2017-10-29 DIAGNOSIS — B182 Chronic viral hepatitis C: Principal | ICD-10-CM

## 2017-11-02 ENCOUNTER — Emergency Department
Admission: EM | Admit: 2017-11-02 | Discharge: 2017-11-02 | Disposition: A | Discharge status: Home / Self Care | Payer: MEDICAID | Source: Intra-hospital

## 2017-11-02 DIAGNOSIS — N644 Mastodynia: Principal | ICD-10-CM

## 2017-11-02 MED ORDER — OXYCODONE 5 MG TABLET
ORAL_TABLET | ORAL | 0 refills | 0.00000 days | Status: CP | PRN
Start: 2017-11-02 — End: 2017-11-07

## 2017-11-02 MED ORDER — SULFAMETHOXAZOLE 800 MG-TRIMETHOPRIM 160 MG TABLET
ORAL_TABLET | Freq: Two times a day (BID) | ORAL | 0 refills | 0.00000 days | Status: CP
Start: 2017-11-02 — End: 2017-11-12

## 2017-11-02 NOTE — Unmapped (Signed)
[facility] [place]  Emergency Department Provider Note        ED Clinical Impression     Final diagnoses:   Breast abscess (Primary)       Initial Impression, ED Course, Assessment and Plan     Impression: Lori Bowers is a 31 y.o. female with a history of hep c presenting to the ED for evaluation of left breast pain and redness x2 days. On exam, the patient is uncomfortable appearing but in no acute distress.. She has a tender, indurated area on left breast at 12 o'clock just above the left nipple. Plan for [place] and pain control.    1:25 PM US shows retroareolar fluid collection 1.8 x 1.8 x 0.8cm. Discussed management with general surgery who recommends bedside I&D vs surgical I&D in the OR for pain control      1:47 PM I spoke with the patient about draining the fluid collection, and the patient states that she would prefer having it drained in the OR. Re-paged Dr. Augustine Radar.     2:01 PM Dr. Augustine Radar will try to schedule the patient for tomorrow. She will be NPO after midnight other than small sips of water with medications. She is agreeable to this plan. Discharged home with antibiotics, pain control and return precautions.     Additional Medical Decision Making     I have reviewed the vital signs and the nursing notes. Labs and radiology results that were available during my care of the patient were independently reviewed by me and considered in my medical decision making.   I independently visualized the radiology images.   I discussed the case with the general surgery consultant.     Portions of this record have been created using Scientist, clinical (histocompatibility and immunogenetics). Dictation errors have been sought, but may not have been identified and corrected.  ____________________________________________       History     Chief Complaint  Breast Pain      HPI   Lori Bowers is a 31 y.o. female with a history of hep c presenting to the ED for evaluation of left breast pain and redness. The patient reports onset of redness and pain about 2 days ago to her left breast. She developed purulent drainage from the nipple shortly after. She denies any symptoms to her right breast. She endorses a history of a similar episode about 2 years ago that was treated with I&D and bactrim. She denies breast feeding, recent trauma. Denies fevers, weight loss     Past Medical History:   Diagnosis Date   ??? Hepatitis C        Patient Active Problem List   Diagnosis   ??? Chronic hepatitis C without hepatic coma (CMS-HCC)       Past Surgical History:   Procedure Laterality Date   ??? TUBAL LIGATION           Current Facility-Administered Medications:   ???  levonorgestrel (MIRENA) 20 mcg/24 hr (5 years) IUD 1 kit, 1 each, Intrauterine, Once, Lucianne Sullivan Lone, MD    Current Outpatient Prescriptions:   ???  escitalopram oxalate (LEXAPRO) 10 MG tablet, Take 10 mg by mouth daily., Disp: , Rfl:   ???  gabapentin (NEURONTIN) 300 MG capsule, Take 300 mg by mouth Three (3) times a day., Disp: , Rfl:   ???  glecaprevir-pibrentasvir (MAVYRET) 100-40 mg tablet, Take 3 tablets by mouth daily. Take with food., Disp: 1 4-week carton, Rfl: 1  ???  ibuprofen (  ADVIL,MOTRIN) 600 MG tablet, Take 1 tablet (600 mg total) by mouth every six (6) hours as needed for pain. (Patient not taking: Reported on 10/08/2017), Disp: 60 tablet, Rfl: 2  ???  QUEtiapine (SEROQUEL) 50 MG tablet, Take 50 mg by mouth nightly., Disp: , Rfl:     Allergies  Patient has no known allergies.    History reviewed. No pertinent family history.    Social History  Social History   Substance Use Topics   ??? Smoking status: Current Every Day Smoker     Packs/day: 0.50     Types: Cigarettes   ??? Smokeless tobacco: Never Used   ??? Alcohol use 1.2 oz/week     2 Glasses of wine per week      Comment: previously sober x 100 days       Review of Systems  Constitutional: Negative for fever.  Eyes: Negative for visual changes.  ENT: Negative for sore throat.  Cardiovascular: Negative for chest pain.  Respiratory: Negative for shortness of breath.  Gastrointestinal: Negative for abdominal pain, vomiting or diarrhea.  Genitourinary: Negative for dysuria.  Musculoskeletal: Negative for back pain.  Skin: Redness, pain to left nipple. Negative for rash.  Neurological: Negative for headaches, focal weakness or numbness.    Physical Exam     ED Triage Vitals [11/02/17 1108]   Enc Vitals Group      BP 142/79      Heart Rate 77      SpO2 Pulse       Resp 18      Temp 36.3 ??C (97.3 ??F)      Temp Source Oral      SpO2 99 %     Constitutional: Alert and oriented. Appears uncomfortable.   Eyes: Conjunctivae are normal.  ENT       Head: Normocephalic and atraumatic.       Nose: No congestion.       Mouth/Throat: Mucous membranes are moist.       Neck: No stridor.  Hematological/Lymphatic/Immunilogical: No left axillary lymphadenopathy.  Cardiovascular: Normal rate, regular rhythm. Normal and symmetric distal pulses are present in all extremities.  Respiratory: Normal respiratory effort. Breath sounds are normal.  Musculoskeletal: Normal range of motion in all extremities.       Right lower leg: No tenderness or edema.       Left lower leg: No tenderness or edema.  Neurologic: Normal speech and language. No gross focal neurologic deficits are appreciated.  Skin: Tenderness, induration on left breast at the 12 o'clock, just above the left nipple.   Psychiatric: Mood and affect are normal. Speech and behavior are normal.      Radiology     US Breast Limited Left    Result Date: 11/02/2017  EXAM: US BREAST LIMITED LEFT DATE: 11/02/2017 12:32 PM ACCESSION: [id number] UN DICTATED: 11/02/2017 12:39 PM INTERPRETATION LOCATION: [facility] CLINICAL INDICATION: 31 years old Female with redness, swelling  concern for abscess--. COMPARISON: Left breast ultrasound 08/17/2015 TECHNIQUE: Targeted ultrasound was performed by the technologist.  The provided images include gray-scale, color doppler static images and cine clips. FINDINGS: A complex heterogeneous retroareolar fluid collection measuring approximately 1.8 x 1.8 x 0.8 cm. The complex collection is located approximately 0.1 cm below the skin surface. There is skin thickening in this region, however no definite skin tract is identified. There is no associated internal vascularity. The surrounding soft tissues are mildly edematous.     ASSESSMENT:   -A 1.8 cm complex left  retroareolar collection, concerning for abscess.     BI-RADS Category: 3-US32mo : Probably Benign.  Short-interval follow up.  Ultrasound in 3 months.   Recommendation Laterality: Left       _________________________________  Documentation assistance was provided by Hughie Closs, Scribe, on November 02, 2017 at 11:12 AM for Shaune Leeks, MD.    November 03, 2017 7:17 AM. Documentation assistance provided by the scribe. I was present during the time the encounter was recorded. The information recorded by the scribe was done at my direction and has been reviewed and validated by me.          Sherryl Barters, MD  11/03/17 [phone]

## 2017-11-02 NOTE — Unmapped (Signed)
Pt ambulatory to room c/o L breast pain and redness x 2 days. Pt denies fevers. Pt reports drainage from her nipple when she applies warm compresses to the breast. Pt reports h/o injury to this breast with surgical intervention in 2014. Pt reports current mastitis since that time.

## 2017-11-02 NOTE — Unmapped (Addendum)
Patient transported to Ultrasound  Transported by Radiology  How tranported Wheelchair  Cardiac Monitor no

## 2017-11-02 NOTE — Unmapped (Signed)
Patient returned from Ultrasound  Transported by Radiology  How tranported Wheelchair  Cardiac Monitor no

## 2017-11-02 NOTE — Unmapped (Signed)
Patient approached staff and stated that she was leaving.  Patient stated she was advised to return in the morning at 0630 hours and check in for surgery.

## 2017-11-03 ENCOUNTER — Ambulatory Visit
Admission: EM | Admit: 2017-11-03 | Discharge: 2017-11-03 | Disposition: A | Discharge status: Home / Self Care | Source: Assisted Living Facility | Admitting: Student in an Organized Health Care Education/Training Program

## 2017-11-03 ENCOUNTER — Ambulatory Visit
Admission: EM | Admit: 2017-11-03 | Discharge: 2017-11-03 | Disposition: A | Discharge status: Home / Self Care | Source: Assisted Living Facility | Attending: Surgery | Admitting: Surgery

## 2017-11-03 DIAGNOSIS — N611 Abscess of the breast and nipple: Principal | ICD-10-CM

## 2017-11-03 NOTE — Unmapped (Signed)
Pt discharged with instructions in hand. Pt denies any needs or concerns about discharge. Pt verbalized understanding of follow-up and how to take medications. Pt ambulatory to lobby.

## 2017-11-03 NOTE — Unmapped (Signed)
Brief Operative Note  (CSN: [id number])      Date of Surgery: 11/03/2017    Pre-op Diagnosis: left breast abscess    Post-op Diagnosis: left breast abscess    Procedure(s):  INCISION & DRAINAGE OF ABSCESS; SIMPLE OR SINGLE    Note: Revisions to procedures should be made in chart - see Procedures tab.    Performing Service: General Surgery  Surgeon(s) and Role:     * Colon Branch, MD - Primary    Findings: left breast abscess    Anesthesia: General    Estimated Blood Loss: Minimal    Complications: None    Specimens:   ID Type Source Tests Collected by Time Destination   A : aspirate Abscess Breast, Left AEROBIC/ANAEROBIC CULTURE Colon Branch, MD 11/03/2017 [phone]        Implants: * No implants in log *    Surgeon Notes: I was present and scrubbed for the entire procedure    Aida Raider   Date: 11/03/2017  Time: 8:41 AM

## 2017-11-03 NOTE — Unmapped (Signed)
Subcutaneous Abscess Incsion and Drainage Procedure Note     Pre-operative Diagnosis:  Subcutanesous abscess, left breast.    Post-operative Diagnosis:   Subcutanesous abscess, left breast.    Procedure (s) Performed:  Incision and drainage of left breast subcutanesous abscess.     Teaching Surgeon:  D. Remigio Eisenmenger, MD.    Resident Surgeon:  None.     Geophysicist/field seismologist (s):  [employer], PA-C.    Due to the complexity of this case, Bernarda Caffey, PA-C (name and provider type of assisting provider) was present to assist with the  INCISION & DRAINAGE OF ABSCESS; SIMPLE OR SINGLE       The following exceptional medical circumstances contributed to the complexity of this case:  The PAs assistance was required because no qualified resident was available to assist with the case. The PA assisted with patient positioning and preparation, and operated as first assistant, to include retraction and suctioning.     Anesthesia:  Monitored Local Anesthesia with Sedation.    Anesthesiologist:  Harriet Masson, MD    Specimens:  Subcutanesous abscess.               Drains: None.     Cultures:  Left breast abscess for Gram stain and culture.    Estimated Blood Loss:  Minimal.    Total IV Fluids:  200 mL crystalloid.           Urine output:  Not recorded.         Complications:  None    Indications:  Lori Bowers has had problems with a subcutaneous abscess and desired incision and drainage. The risks, benefits and alternatives to treatment were discussed with the patient and understanding these, the patient wished to proceed.     Operative Findings:  There was a left breast retroareolar subcutaneous abscess.    Procedure Details:  The patient was positively identified and consent was verified in the holding area. The operative site was verified with the patient in the holding area. The patient was taken from the holding area to the operating room. Prior to beginning anesthesia, a timeout was called to verify the patient's identification, the patient's allergies and the intended procedure. Kefzol 2 grams IV was given for antibiotic prophylaxis. Sequential compression devices were placed for DVT prophylaxis.  The patient was placed in the supine position operating room table being careful to pad all pressure points and to secure the patient safely to the table. Monitored anesthesia care with sedation was begun The skin was prepped and draped in the usual sterile fashion.  A second timeout was called to verify the patient's identification, the intended procedure and the patient's allergies.     The subcutaneous abscess cavity was aspirated and the aspirate was passed off the field as specimen for Gram stain and culture.    The skin of the left breast was marked for a periareolar skin incision overlying the area of the subcutaneous abscess for a length of approximately 1 cm. The skin incision was made with a #15 blade. The incision was carried through the subcutaneous tissue with blunt and electrocautery dissection to the level of the subcutaneous abscess. The abscess cavity was explored and loculations were disrupted with blunt dissection.  The wound was copiously irrigated. Hemostasis was meticulously ensured.  The subcutaneous tissues was packed with iodoform gauze. The wound was dressed with a sterile ABD pad.     The patient was returned to the supine position, awakened, and taken from  the operating room to the recovery area in stable condition.      Condition:  Good    Disposition: PACU - hemodynamically stable.    Post-op plan/Instructions: The patient should be discharged home today and follow up in my clinic tomorrow for removal of the packing.     Teaching Surgeon Attestation:  I, Colon Branch, M.D., was present for the entire procedure from preprocedure timeout to dressing placement.     Berton Mount, MD  11/03/2017  8:52 AM

## 2017-11-03 NOTE — Unmapped (Signed)
[facility] CONSULT NOTE    Patient Name: Lori Bowers  Medical Record Number: [id number]  Referring Provider: Cheri Rous, [facility]      Assessment and Plan: Lori Bowers is a 31 y.o. female with a PMH significant for Hepatitic C who presents with a heterogeneous retroareolar fluid collection measuring approximately 1.8 x 1.8 x 0.8 cm of the left breast.    -Plan for I&D of fluid collection in the OR today  -Planned D/C following procedure  -Follow-up will be scheduled in the General Surgery clinic    Case discussed with Dr. Augustine Radar.  General Surgery can be reached at [phone].    Bernarda Caffey, MHS, PA-C  General Surgery  11/03/2017  7:03 AM    Teaching Surgeon Attestation:  I, D. Remigio Eisenmenger M.D., saw and evaluated the patient. I have reviewed and edited the above note, and I agree with the findings and the plan of care as documented in the PA's note.      History of Present Illness: Lori Bowers is a 31 y.o. female who presented to the [facility] ED on 11/02/2017 with complaints of 2 days of left breast pain.  She states her left breast has been tender and has erythema just above her nipple.  Lori Bowers also reports she has unusual nipple discharge.  She has had an episode similar to this in the past (~2 years ago).      Past Medical History:  Past Medical History:   Diagnosis Date   ??? Hepatitis C    From records in the emergency department on 06/22/2015 the current health, she has history of polysubstance abuse including IV heroin, cocaine, and marijuana    Past Surgical History:   Past Surgical History:   Procedure Laterality Date   ??? TUBAL LIGATION         Social History:  Lori Bowers lives in [place], [place]    Tobacco use: current smoker, 0.5 packs per day   Alcohol use: occassional use      Family History:   The patient's family history is not on file.    Review of systems:   A 12 system review of systems was negative except as noted in HPI    Medication:  Current Facility-Administered Medications Medication Dose Route Frequency Provider Last Rate Last Dose   ??? levonorgestrel (MIRENA) 20 mcg/24 hr (5 years) IUD 1 kit  1 each Intrauterine Once Lucianne Sullivan Lone, MD         Current Outpatient Prescriptions   Medication Sig Dispense Refill   ??? escitalopram oxalate (LEXAPRO) 10 MG tablet Take 10 mg by mouth daily.     ??? gabapentin (NEURONTIN) 300 MG capsule Take 300 mg by mouth Three (3) times a day.     ??? glecaprevir-pibrentasvir (MAVYRET) 100-40 mg tablet Take 3 tablets by mouth daily. Take with food. 1 4-week carton 1   ??? ibuprofen (ADVIL,MOTRIN) 600 MG tablet Take 1 tablet (600 mg total) by mouth every six (6) hours as needed for pain. (Patient not taking: Reported on 10/08/2017) 60 tablet 2   ??? oxyCODONE (ROXICODONE) 5 MG immediate release tablet Take 1 tablet (5 mg total) by mouth every four (4) hours as needed for pain. for up to 5 days 13 tablet 0   ??? QUEtiapine (SEROQUEL) 50 MG tablet Take 50 mg by mouth nightly.     ??? sulfamethoxazole-trimethoprim (BACTRIM DS) 800-160 mg per tablet Take 1 tablet (160 mg of trimethoprim total)  by mouth Two (2) times a day. for 10 days 20 tablet 0         Allergies:  Patient has no known allergies.    LMP 10/08/2017 (Exact Date) Comment: IUD There is no height or weight on file to calculate BMI.    Physical exam:  Consitutional: NAD  Neuro: Grossly intact  Eyes: anicteric  Ears, nose, mouth, throat: Mucous membranes moist  CV: RRR  Resp: CTAB  Abd: Soft, nontender, nondistended  Musculoskeletal: 5/5 strength throughout  Skin: Left breast tenderness/induration, periareolar at the 1 o'clock position  Psychiatric: Appropriate affect    Imaging:  [place] Left Breast (11/02/2017):  FINDINGS: A complex heterogeneous retroareolar fluid collection measuring approximately 1.8 x 1.8 x 0.8 cm. The complex collection is located approximately 0.1 cm below the skin surface. There is skin thickening in this region, however no definite skin tract is identified. There is no associated internal vascularity. The surrounding soft tissues are mildly edematous.     ASSESSMENT:  -A 1.8 cm complex left retroareolar collection, concerning for abscess.  BI-RADS Category: 3-US32mo : Probably Benign. ??Short-interval follow up. ??Ultrasound in 3 months.  Recommendation Laterality: Left

## 2017-11-04 ENCOUNTER — Ambulatory Visit: Admission: RE | Admit: 2017-11-04 | Discharge: 2017-11-04 | Payer: MEDICAID | Attending: Surgery | Admitting: Surgery

## 2017-11-04 DIAGNOSIS — Z09 Encounter for follow-up examination after completed treatment for conditions other than malignant neoplasm: Principal | ICD-10-CM

## 2017-11-04 NOTE — Unmapped (Signed)
Patient returns today for packing removal. Pain 7/10.

## 2017-11-04 NOTE — Unmapped (Signed)
Primary Care Provider:  Deitra Mayo, MD    Subjective: Lori Bowers is a 31 y.o. old female who underwent Incision and drainage of left breast subcutanesous abscess on 11/03/2017.  She returns to clinic today for routine follow up.  She reports that she is recovering well.  Her postoperative discomfort has improved and she is no longer requiring prescription pain medicine.  She has begun to resume her normal activities.    Physical Examination:     BP 129/77 (BP Site: R Arm, BP Position: Sitting, BP Cuff Size: Medium)  - Pulse 101  - Temp 36.8 ??C (98.3 ??F) (Oral)  - Ht 154.9 cm (5' 0.98)  - Wt 64.8 kg (142 lb 12.8 oz)  - LMP 10/08/2017 (Exact Date) Comment: IUD - SpO2 100%  - BMI 27.00 kg/m??     SKIN:  Directed examination of the left breast reveals that, after removal of the packing, the wound is clean with no redness or drainage.      Gram stain and culture:    3+ gram-negative bacilli    Assessment:  Lori Bowers is a 31 y.o. old female who underwent Incision and drainage of left breast subcutanesous abscess on 11/03/2017. She is recovering as anticipated. Gram stain revealed 3+ gram-negative bacilli.    Plan:  [name] should continue to care for the wound by keeping it covered, and cleaning daily in the shower with soap and water.    We will call her once her cultures have speciated to tailor her antibiotic coverage.    She will return to clinic in 2 weeks for a wound check or sooner as needed.      Berton Mount, MD  11/04/2017  8:47 AM

## 2018-01-16 NOTE — Unmapped (Signed)
Reason for call: Patient canceled appointment with Provider on 01/08/18    Hep C  Genotype: 3 (09/03/17)  Planned Regimen: Mavyret x 8 weeks  Fibrosis: F0-F1/5.2kPa Fibroscan on 10/08/17    Called patient at phone number [phone] and left VM request for patient to return call. Called phone number [phone] and spoke with Mr. Lyda Jester who will give patient the message to return call. Contact information provided. Patient will need to complete Mfr assistance application for Mavyret and be rescheduled for new appointment with Provider.       Vertell Limber RN, BSN  Nursing Care Coordinator   Pharmacy Adult GI Medicine  [facility]  [address]   [place], [place] [address]  [phone]

## 2018-01-22 ENCOUNTER — Encounter
Admit: 2018-01-22 | Discharge: 2018-01-23 | Disposition: A | Discharge status: Home / Self Care | Payer: MEDICAID | Attending: Physician Assistant

## 2018-01-22 LAB — CBC W/ AUTO DIFF
EOSINOPHILS ABSOLUTE COUNT: 0.3 10*9/L (ref 0.0–0.4)
HEMATOCRIT: 45.7 % (ref 36.0–46.0)
HEMOGLOBIN: 15 g/dL (ref 13.5–16.0)
LARGE UNSTAINED CELLS: 1 % (ref 0–4)
MEAN CORPUSCULAR HEMOGLOBIN CONC: 32.8 g/dL (ref 31.0–37.0)
MEAN CORPUSCULAR HEMOGLOBIN: 28.6 pg (ref 26.0–34.0)
MEAN CORPUSCULAR VOLUME: 87.2 fL (ref 80.0–100.0)
MEAN PLATELET VOLUME: 8.6 fL (ref 7.0–10.0)
MONOCYTES ABSOLUTE COUNT: 0.6 10*9/L (ref 0.2–0.8)
PLATELET COUNT: 319 10*9/L (ref 150–440)
RED BLOOD CELL COUNT: 5.24 10*12/L — ABNORMAL HIGH (ref 4.00–5.20)
RED CELL DISTRIBUTION WIDTH: 15.2 % — ABNORMAL HIGH (ref 12.0–15.0)
WBC ADJUSTED: 21.8 10*9/L — ABNORMAL HIGH (ref 4.5–11.0)

## 2018-01-22 LAB — SMEAR REVIEW

## 2018-01-22 LAB — MEAN CORPUSCULAR HEMOGLOBIN CONC: Lab: 32.8

## 2018-01-22 MED ORDER — IBUPROFEN 600 MG TABLET
ORAL_TABLET | Freq: Four times a day (QID) | ORAL | 0 refills | 0.00000 days | Status: CP | PRN
Start: 2018-01-22 — End: 2018-02-01

## 2018-01-22 MED ORDER — PENICILLIN V POTASSIUM 500 MG TABLET
ORAL_TABLET | Freq: Three times a day (TID) | ORAL | 0 refills | 0 days | Status: CP
Start: 2018-01-22 — End: 2018-01-22

## 2018-01-22 MED ORDER — CLINDAMYCIN HCL 150 MG CAPSULE
ORAL_CAPSULE | Freq: Four times a day (QID) | ORAL | 0 refills | 0 days | Status: CP
Start: 2018-01-22 — End: 2018-01-29

## 2018-01-22 NOTE — Unmapped (Signed)
Reason for call: Patient canceled appointment with Provider on 01/08/18  ??  Hep C  Genotype: 3 (09/03/17)  Planned Regimen: Mavyret x 8 weeks  Fibrosis: F0-F1/5.2kPa Fibroscan on 10/08/17  ??  Called patient at phone number [phone] and left VM request for patient to return call. Contact information provided. Patient will need to complete Mfr assistance application for Mavyret and be rescheduled for new appointment with Provider.       Vertell Limber RN, BSN  Nursing Care Coordinator   Pharmacy Adult GI Medicine  [facility]  [address]   [place], [place] [address]  [phone]

## 2018-01-23 NOTE — Unmapped (Signed)
[facility]  Emergency Department Provider Note      ED Clinical Impression     Final diagnoses:   None       Initial Impression, ED Course, Assessment and Plan     Assessment/Plan: Patient is a 32 y.o. female with PMH of hep C, IV drug abuse (9 months remission) presenting for dental pain since 4 AM this morning.  Upon arrival she has a low-grade fever and tachycardia but is nontoxic appearing.  Appears to have a dental infection but no evidence suggestive of deep space infection such as Ludwig nor she will cellulitis.  I do not suspect that she is bacteremic from this dental infection.  May also consider influenza-like illness concurrently given her presentation.    Plan for rapid flu, CBC, IV fluids, symptom control with Zofran and hydroxyzine (anxiolytic for dental block), dental block, and reassess.  There is no indication for emergent imaging.    6:28 PM  WBC elevated to 21.8. Will start IV clindamycin and send blood cultures.     6:58 PM   Nerve block tolerated well with no immediate complication and improvement of pain. Patient was discharged in stable condition, with ibuprofen, 7 day course of clindamycin, and dental options sheet. Strict return precautions discussed. Patient understands and is agreeable with the plan at discharge.     Additional Medical Decision Making     I have reviewed the vital signs and the nursing notes. Labs and radiology results that were available during my care of the patient were independently reviewed by me and considered in my medical decision making. I reviewed the patient's prior medical records.   ____________________________________________       History     Chief Complaint  Dental Pain      HPI   Lori Bowers is a 32 y.o. female with PMH of hep C, IV drug abuse (9 month remission), and sepsis who presents with dental pain. The patient reports waking up this morning at 4 AM with sudden onset of sharp, 8/10, right-lower dental pain. She states that the pain has been constant with associated nausea, diffuse myalgia, and right ear pain. Patient endorses the onset of emesis 4 hours PTA and has been unable to tolerate solid or fluid PO since. She denies any sore throat, congestion, coughing, diarrhea, abdominal pain, chest pain, SOB, dysuria, or urinary frequency. Patient reports a history of poor dentition, with dental abscesses in the past. She is currently waiting for charity care to go to the [facility] dental clinic.     Past Medical History:   Diagnosis Date   ??? Hepatitis C        Patient Active Problem List   Diagnosis   ??? Chronic hepatitis C without hepatic coma (CMS-HCC)       Past Surgical History:   Procedure Laterality Date   ??? PR DRAIN SKIN ABSCESS SIMPLE Left 11/03/2017    Procedure: INCISION & DRAINAGE OF ABSCESS; SIMPLE OR SINGLE;  Surgeon: Colon Branch, MD;  Location: [facility];  Service: General Surgery   ??? TUBAL LIGATION           Current Facility-Administered Medications:   ???  levonorgestrel (MIRENA) 20 mcg/24 hr (5 years) IUD 1 kit, 1 each, Intrauterine, Once, Lucianne Sullivan Lone, MD    Current Outpatient Prescriptions:   ???  escitalopram oxalate (LEXAPRO) 10 MG tablet, Take 10 mg by mouth daily., Disp: , Rfl:   ???  gabapentin (NEURONTIN)  300 MG capsule, Take 300 mg by mouth Three (3) times a day., Disp: , Rfl:   ???  glecaprevir-pibrentasvir (MAVYRET) 100-40 mg tablet, Take 3 tablets by mouth daily. Take with food., Disp: 1 4-week carton, Rfl: 1  ???  ibuprofen (ADVIL,MOTRIN) 600 MG tablet, Take 1 tablet (600 mg total) by mouth every six (6) hours as needed for pain. (Patient not taking: Reported on 11/04/2017), Disp: 60 tablet, Rfl: 2  ???  QUEtiapine (SEROQUEL) 50 MG tablet, Take 50 mg by mouth nightly., Disp: , Rfl:     Allergies  Patient has no known allergies.    History reviewed. No pertinent family history.    Social History  Social History   Substance Use Topics   ??? Smoking status: Current Every Day Smoker     Packs/day: 0.50 Types: Cigarettes   ??? Smokeless tobacco: Never Used   ??? Alcohol use 1.2 oz/week     2 Glasses of wine per week      Comment: previously sober x 100 days       Review of Systems  Constitutional: See HPI  HENT: See HPI  Eyes: Negative  Respiratory: Negative  Cardiovascular: Negative  Gastrointestinal: See HPI  Genitourinary: Negative  Skin: Negative   Musculoskeletal: Negative  Neurological: Negative  All other systems have been reviewed with the patient (or family) and are negative.    Physical Exam     Physical Examination:  Vital Signs: BP 161/78  - Temp 38 ??C (100.4 ??F) (Oral)  - Resp 20  - SpO2 100%   General: Patient is awake and alert. Patient is oriented x3. Patient is nontoxic appearing.  Head: The head is normocephalic and atraumatic.  Eyes: The pupils are equal sized and reactive to light. The extraocular muscles are intact.  ENT: Overall poor dentition. Tenderness to the right mandibular and right preauricular regions. Mild erythema and edema surrounding tooth #5.  Chronically appearing fracture to tooth #28, with surrounding gingival edema and erythema. Mild erythema to the posterior oropharynx.Patient's airway is intact.  Neck: Supple.  Chest/Respiratory: Respiratory effort is normal. The lungs are clear to auscultation bilaterally.   Cardiovascular: The heart sounds have a normal S1/S2. The heart has a slightly tachycardic rate and regular rhythm.   GI/Abdomen: Abdomen is soft.   Musculoskeletal: Patient does not have edema.  Skin: The patient's skin is intact.   Neurologic: The neurological exam shows no focal deficits.     Laboratory Studies     CBC remarkable for WBC of 21.8. Rapid flu negative. Blood cultures sent prior to discharge.     Procedures     Nerve Block  Date/Time: 01/22/2018 6:54 PM  Performed by: Venita Lick RUTH  Authorized by: Eulis Foster   Consent: Verbal consent obtained. Written consent not obtained.  Risks and benefits: risks, benefits and alternatives were discussed  Consent given by: patient  Patient understanding: patient states understanding of the procedure being performed  Patient consent: the patient's understanding of the procedure matches consent given  Procedure consent: procedure consent matches procedure scheduled  Patient identity confirmed: verbally with patient and arm band  Time out: Immediately prior to procedure a time out was called to verify the correct patient, procedure, equipment, support staff and site/side marked as required.  Indications: pain relief  Body area: face/mouth  Nerve: inferior alveolar  Laterality: right    Sedation:  Patient sedated: no  Preparation: Patient was prepped and draped in the usual sterile fashion.  Patient position:  supine  Needle size: 25 G  Location technique: anatomical landmarks  Local Anesthetic: bupivacaine 0.5% without epinephrine  Outcome: pain improved  Patient tolerance: Patient tolerated the procedure well with no immediate complications        Venita Lick, MS, PA-C    Documentation assistance was provided by Geroge Baseman, Scribe, on January 22, 2018 at 4:47 PM for [facility], PA-C.    A scribe was used when documenting this visit. I agree with the above documentation. Signed by  Thornell Mule on  January 22, 2018 at 7:47 PM              Eulis Foster, [place]  01/22/18 [phone]

## 2018-01-23 NOTE — Unmapped (Signed)
Pt stating right lower teeth pain in 3 teeth that started around 0400 this morning. Pt stating that she then began with cough, HA, and body aches. Pt is febrile here. Pt stating this is how I felt when I had a blood infection. Pt is crying and coughing. PA student at bedside for assessment. Pt's skin WNL

## 2018-01-23 NOTE — Unmapped (Signed)
Pt provided with warm balnket

## 2018-01-23 NOTE — Unmapped (Signed)
Pt reports dental pain that started 4 am then start having body aches and fever. Pt with no other symptoms or concerns

## 2018-01-24 NOTE — Unmapped (Signed)
Preliminary blood culture #[id number]  and #[id number] No Growth at 24 hours.

## 2018-01-27 NOTE — Unmapped (Signed)
Preliminary blood culture #[id number]  and #[id number] No Growth at 4 days.

## 2018-01-28 NOTE — Unmapped (Signed)
Final blood culture #[id number]  and #[id number] No Growth at 5 days.

## 2018-02-06 NOTE — Unmapped (Signed)
Reason for call: Patient canceled appointment with Provider on 01/08/18  ??  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    Patient needs new appointment with Provider and will need to complete Mfr assistance application for Mavyret. I called patient at phone number [phone] and left a message with patient's mother who said she will have her call me back. Contact information provided.       Vertell Limber RN, BSN  Nursing Care Coordinator   Pharmacy Adult GI Medicine  [facility]  [address]   [place], [place] [address]  [phone]

## 2018-02-21 ENCOUNTER — Encounter: Admit: 2018-02-21 | Discharge: 2018-02-22 | Disposition: A | Discharge status: Home / Self Care | Payer: MEDICAID

## 2018-02-21 ENCOUNTER — Ambulatory Visit: Admit: 2018-02-21 | Discharge: 2018-02-22 | Disposition: A | Discharge status: Home / Self Care | Payer: MEDICAID

## 2018-02-21 DIAGNOSIS — N644 Mastodynia: Principal | ICD-10-CM

## 2018-02-21 LAB — CBC W/ AUTO DIFF
BASOPHILS ABSOLUTE COUNT: 0 10*9/L (ref 0.0–0.1)
BASOPHILS RELATIVE PERCENT: 0.3 %
EOSINOPHILS RELATIVE PERCENT: 6.6 %
HEMATOCRIT: 44.3 % (ref 36.0–46.0)
HEMOGLOBIN: 14.2 g/dL (ref 13.5–16.0)
LARGE UNSTAINED CELLS: 2 % (ref 0–4)
LYMPHOCYTES ABSOLUTE COUNT: 2 10*9/L (ref 1.5–5.0)
LYMPHOCYTES RELATIVE PERCENT: 21.9 %
MEAN CORPUSCULAR HEMOGLOBIN CONC: 32.2 g/dL (ref 31.0–37.0)
MEAN CORPUSCULAR HEMOGLOBIN: 28.8 pg (ref 26.0–34.0)
MEAN CORPUSCULAR VOLUME: 89.3 fL (ref 80.0–100.0)
MEAN PLATELET VOLUME: 7.7 fL (ref 7.0–10.0)
MONOCYTES ABSOLUTE COUNT: 0.6 10*9/L (ref 0.2–0.8)
NEUTROPHILS ABSOLUTE COUNT: 5.7 10*9/L (ref 2.0–7.5)
PLATELET COUNT: 299 10*9/L (ref 150–440)
RED BLOOD CELL COUNT: 4.96 10*12/L (ref 4.00–5.20)
RED CELL DISTRIBUTION WIDTH: 15 % (ref 12.0–15.0)
WBC ADJUSTED: 9.1 10*9/L (ref 4.5–11.0)

## 2018-02-21 LAB — BASIC METABOLIC PANEL
BLOOD UREA NITROGEN: 8 mg/dL (ref 7–21)
BUN / CREAT RATIO: 13
CALCIUM: 9.5 mg/dL (ref 8.5–10.2)
CHLORIDE: 109 mmol/L — ABNORMAL HIGH (ref 98–107)
CO2: 20 mmol/L — ABNORMAL LOW (ref 22.0–30.0)
CREATININE: 0.61 mg/dL (ref 0.60–1.00)
EGFR MDRD AF AMER: >=60 mL/min/{1.73_m2} (ref >=60)
EGFR MDRD NON AF AMER: >=60 mL/min/{1.73_m2} (ref >=60)
GLUCOSE RANDOM: 97 mg/dL (ref 65–179)
POTASSIUM: 4.7 mmol/L (ref 3.5–5.0)
SODIUM: 139 mmol/L (ref 135–145)

## 2018-02-21 LAB — BETA HCG QUANTITATIVE: Choriogonadotropin.beta subunit:ACnc:Pt:Ser/Plas:Qn:: <5

## 2018-02-21 LAB — MEAN CORPUSCULAR HEMOGLOBIN: Lab: 28.8

## 2018-02-21 LAB — POTASSIUM: Potassium:SCnc:Pt:Ser/Plas:Qn:: 4.7

## 2018-02-21 MED ORDER — OXYCODONE-ACETAMINOPHEN 5 MG-325 MG TABLET
ORAL_TABLET | Freq: Four times a day (QID) | ORAL | 0 refills | 0.00000 days | Status: CP | PRN
Start: 2018-02-21 — End: 2018-02-26

## 2018-02-21 MED ORDER — AMOXICILLIN 875 MG-POTASSIUM CLAVULANATE 125 MG TABLET
ORAL_TABLET | Freq: Two times a day (BID) | ORAL | 0 refills | 0.00000 days | Status: CP
Start: 2018-02-21 — End: 2018-03-03

## 2018-02-21 NOTE — Unmapped (Signed)
[facility] CONSULT NOTE    Patient Name: Lori Bowers  Medical Record Number: [id number]  Referring Provider: Dr. Magdalene Molly, MD      Assessment and Plan: Lori Bowers is a 32 y.o. female w/ h/o left breast abscess s/p I+D 10/2017 who presents with recurrence of breast abscess with a nearly draining pustule.     - Patient consented for bedside I+D  - Discharged instructions provided verbally and in patients chart  - OK to discharge patient from ED with pain medication and Augmentin for 10 days  - Will arrange for follow up in 1 week with Dr. Augustine Radar    Teaching Surgeon Attestation:  I, D. Remigio Eisenmenger M.D., saw and evaluated the patient. I have reviewed and edited the above note, and I agree with the findings and the plan of care as documented in the Resident's note.        History of Present Illness: Lori Bowers is a 32 y.o. female w/ h/o hepatitis C and left breast abscess s/p I+D 10/2017 who presents with worsening breast pain. She states that in the last 48 hours her left breast has become red and painful and associated with nausea. She denies any fevers, chills, abdominal pain, changes in BM or urination, nipple drainage or bleeding. She states the swelling is in the same location as the previous abscess. Her original drainage grew mixed anaerobes. Ultrasound today demonstrates a 1.6 x 1.5 x 1.9 cm fluid collection superior to the nipple.     Past Medical History:  Past Medical History:   Diagnosis Date   ??? Hepatitis C        Past Surgical History:   Past Surgical History:   Procedure Laterality Date   ??? PR DRAIN SKIN ABSCESS SIMPLE Left 11/03/2017    Procedure: INCISION & DRAINAGE OF ABSCESS; SIMPLE OR SINGLE;  Surgeon: Colon Branch, MD;  Location: [facility] OR [facility];  Service: General Surgery   ??? TUBAL LIGATION         Social History:  Social History     Social History   ??? Marital status: Single     Spouse name: N/A   ??? Number of children: N/A   ??? Years of education: N/A     Occupational History ??? Not on file.     Social History Main Topics   ??? Smoking status: Current Every Day Smoker     Packs/day: 0.50     Types: Cigarettes   ??? Smokeless tobacco: Never Used   ??? Alcohol use 1.2 oz/week     2 Glasses of wine per week      Comment: previously sober x 100 days   ??? Drug use: No      Comment: clean for 6 months   ??? Sexual activity: No     Other Topics Concern   ??? Not on file     Social History Narrative   ??? No narrative on file       Family History:   The patient's family history is not on file..    Review of systems:   A 12 system review of systems was negative except as noted in HPI    Medication:  Current Facility-Administered Medications   Medication Dose Route Frequency Provider Last Rate Last Dose   ??? levonorgestrel (MIRENA) 20 mcg/24 hr (5 years) IUD 1 kit  1 each Intrauterine Once Lucianne Sullivan Lone, MD       ??? sodium chloride (NS) 0.9 %  infusion  125 mL/hr Intravenous Continuous Allie Bossier, MD   Stopped at 02/21/18 1415     Current Outpatient Prescriptions   Medication Sig Dispense Refill   ??? escitalopram oxalate (LEXAPRO) 10 MG tablet Take 10 mg by mouth daily.     ??? gabapentin (NEURONTIN) 300 MG capsule Take 300 mg by mouth Three (3) times a day.     ??? glecaprevir-pibrentasvir (MAVYRET) 100-40 mg tablet Take 3 tablets by mouth daily. Take with food. 1 4-week carton 1   ??? QUEtiapine (SEROQUEL) 50 MG tablet Take 50 mg by mouth nightly.         Allergies:  Patient has no known allergies.    BP 133/67  - Pulse 61  - Temp 36.2 ??C (Oral)  - Resp 18  - LMP 02/16/2018  - SpO2 98%  There is no height or weight on file to calculate BMI.    Physical exam:  Consitutional: NAD, slightly uncomfortable  Neuro: A+Ox4  Eyes: anicteric  Heart/CV: RRR  Chest/Resp: CTAB, NWOB. Left breast with erythema and central pustule (1 x 1cm) at the 12oclock position 1 cm from the nipple. No nipple discharge. No other skin changes or masses.    Abd/GI: Soft, nontender, nondistended    Imaging:  US Breast Limited Left    Result Date: 02/21/2018  EXAM: US BREAST LIMITED LEFT DATE: 02/21/2018 9:25 AM ACCESSION: [id number] UN DICTATED: 02/21/2018 9:26 AM INTERPRETATION LOCATION: [facility] CLINICAL INDICATION: 32 years old Female with abscess-.  Focal pain and tenderness of the left breast, superior to the nipple. COMPARISON: 2018, 2016 TECHNIQUE: Targeted ultrasound was performed by the technologist.  The provided images include gray-scale, color doppler static images and cine clips. FINDINGS: Per report, patient is expressing focal tenderness above her left nipple for the past 2 days, at the site of prior recurrent abscess which has been drained in the past, most recently in November 2018. Photo of the left breast in the media tab demonstrates an area of erythema surrounding a raised, discolored skin lesion superior to the retracted nipple. Targeted ultrasound of the left breast 12:00 periareolar region demonstrates an intradermal collection measuring about 1.6 x 1.5 x 1.9 cm, just superior to the nipple. This collection abuts but does not appear to involve the nipple. Query abscess versus hematoma versus epidermal inclusion cyst. ASSESSMENT: BI-RADS Category: 3-US6wk : Probably Benign.  Short-interval follow-up.  Ultrasound in 6 weeks. Recommendation Laterality: Left Clinical follow up is recommended for left breast complicated dermal collection.

## 2018-02-21 NOTE — Unmapped (Signed)
[facility]  Emergency Department Provider Note     ED Clinical Impression     Final diagnoses:   Breast abscess (Primary)       Initial Impression, ED Course, Assessment and Plan     32 y.o. female with a history of hepatitis C and left breast abscess s/p surgical I&D on 10/2017 presenting to the ED for acutely worsening left breast pain x2 days. On exam, patient is tearful and in mild distress. She has a 6cm by 4cm area of erythema and tenderness superior to left areola (see picture below). Plan for US breast, hCG quant, and basic labs. Given IM morphine.     12:28 PM   US shows intradermal collection measuring about 1.6 x 1.5 x 1.9 cm, just superior to the nipple. This collection abuts but does not appear to involve the nipple. I discussed the case with General Surgery and they will come and evaluate the patient at bedside after they finish their current operation.     2:21 PM   Reassessed patient and she reports increased pain. Given IM morphine. Awaiting surgery evaluation. The abscess appears to be coming to a head.     History     Chief Complaint  Breast Pain      HPI   Lori Bowers is a 32 y.o. female with a history of hepatitis C and substance abuse presenting to the ED for acutely worsening left breast pain x2 days. Per chart review, patient underwent surgical I&D of subcutaneous abscess to her left breast on 10/2017. Currently, patient reports severe left breast pain with associated erythema localized to the superior areola the past 2 days. She states that her current pain is significant worse than her past abscess. No recent injuries or traumas to the breast. She denies any fevers or drainage. She has used hot compressed without relief.     Past Medical History:   Diagnosis Date   ??? Hepatitis C        Past Surgical History:   Procedure Laterality Date   ??? PR DRAIN SKIN ABSCESS SIMPLE Left 11/03/2017    Procedure: INCISION & DRAINAGE OF ABSCESS; SIMPLE OR SINGLE;  Surgeon: Colon Branch, MD;  Location: [facility] OR [facility];  Service: General Surgery   ??? TUBAL LIGATION           Current Facility-Administered Medications:   ???  levonorgestrel (MIRENA) 20 mcg/24 hr (5 years) IUD 1 kit, 1 each, Intrauterine, Once, Lucianne Sullivan Lone, MD    Current Outpatient Prescriptions:   ???  amoxicillin-clavulanate (AUGMENTIN) 875-125 mg per tablet, Take 1 tablet by mouth Two (2) times a day. for 10 days, Disp: 20 tablet, Rfl: 0  ???  escitalopram oxalate (LEXAPRO) 10 MG tablet, Take 10 mg by mouth daily., Disp: , Rfl:   ???  gabapentin (NEURONTIN) 300 MG capsule, Take 300 mg by mouth Three (3) times a day., Disp: , Rfl:   ???  glecaprevir-pibrentasvir (MAVYRET) 100-40 mg tablet, Take 3 tablets by mouth daily. Take with food., Disp: 1 4-week carton, Rfl: 1  ???  oxyCODONE-acetaminophen (PERCOCET) 5-325 mg per tablet, Take 1 tablet by mouth every six (6) hours as needed for pain. for up to 5 days, Disp: 16 tablet, Rfl: 0  ???  QUEtiapine (SEROQUEL) 50 MG tablet, Take 50 mg by mouth nightly., Disp: , Rfl:     Allergies  Patient has no known allergies.    History reviewed. No pertinent family history.  Social History  Social History   Substance Use Topics   ??? Smoking status: Current Every Day Smoker     Packs/day: 0.50     Types: Cigarettes   ??? Smokeless tobacco: Never Used   ??? Alcohol use 1.2 oz/week     2 Glasses of wine per week      Comment: previously sober x 100 days       Review of Systems  Constitutional: Negative for fever.  Eyes: Negative for visual changes.  ENT: Negative for sore throat.  Cardiovascular: Negative for chest pain.  Respiratory: Negative for shortness of breath.  Gastrointestinal: Negative for abdominal pain, vomiting or diarrhea.  Genitourinary: Negative for dysuria.  Musculoskeletal: Negative for back pain.  Skin: Positive for left breast pain and redness. Negative for rash.  Neurological: Negative for headaches, weakness or numbness.    Physical Exam     VITAL SIGNS:    ED Triage Vitals [02/21/18 0818]   Enc Vitals Group      BP 155/98      Heart Rate 82      SpO2 Pulse       Resp 18      Temp 36 ??C (96.8 ??F)      Temp Source Oral      SpO2 100 %     Constitutional: Alert and oriented. Well appearing and in no distress.  Eyes: Conjunctivae are normal.  ENT       Head: Normocephalic and atraumatic.       Nose: No congestion.       Mouth/Throat: Mucous membranes are moist.       Neck: No stridor.  Hematological/Lymphatic/Immunilogical: No cervical lymphadenopathy.  Cardiovascular: Normal rate, regular rhythm. Normal and symmetric distal pulses are present in all extremities.  Respiratory: Normal respiratory effort. Breath sounds are normal.  Gastrointestinal: Soft and nontender. There is no CVA tenderness.  Musculoskeletal: Nontender with normal range of motion in all extremities.       Right lower leg: No tenderness or edema.       Left lower leg: No tenderness or edema.  Neurologic: Normal speech and language. No gross focal neurologic deficits are appreciated.  Skin: Skin is warm, dry and intact. 6cm by 4cm area of erythema and tenderness superior to left areola.  Psychiatric: Mood and affect are normal. Speech and behavior are normal.        Radiology     US Breast  Findings: Targeted ultrasound of the left breast 12:00 periareolar region demonstrates an intradermal collection measuring about 1.6 x 1.5 x 1.9 cm, just superior to the nipple. This collection abuts but does not appear to involve the nipple. Query abscess versus hematoma versus epidermal inclusion cyst.     Pertinent labs & imaging results that were available during my care of the patient were reviewed by me and considered in my medical decision making (see chart for details).    Documentation assistance was provided by Kennith Center, Scribe, on February 21, 2018 at 8:11 AM for Doylene Canning, MD.  February 22, 2018 5:42 PM. Documentation assistance provided by the scribe. I was present during the time the encounter was recorded. The information recorded by the scribe was done at my direction and has been reviewed and validated by me.        Allie Bossier, MD  02/22/18 [phone]

## 2018-02-21 NOTE — Unmapped (Signed)
Patient presents to ED reports pain in her left breast around her nipple area. States she was seen here within the past few weeks and had this area drained. States the area is now raised and dark in color, with some redness around the area.   Painful to touch. No drainage.   Denies any trauma, Denies any body piercing to that area.

## 2018-02-21 NOTE — Unmapped (Signed)
Had recent surgery in November for an I&D of her left breast. States she area appears infected and is swollen again

## 2018-02-22 NOTE — Unmapped (Signed)
Abscess drainage procedure note    Preoperative Diagnosis: left breast abscess     Postoperative Diagnosis: Same    Procedure(s) Performed: bedside incision and drainage of breast abscess     Attending Surgeon: D. Remigio Eisenmenger, MD     Resident Surgeon: Lynder Parents, MD     Anesthesia: local     Specimens:  Culture swab     Estimated Blood Loss: 2 cc     Complications: None.     Indications for Surgery: 32 yo F w/ recurrent left breast abscess     Operative Findings: Abscess at 12 oclock position of left breast, 1 cm from nipple with  1 x 1 cm pustule, draining purulence, tracking ~ 1 cm deep.     Procedure:     A consent was obtained and timeout performed at beside. The left breast was prepped with betadine and draped in typical sterile fashion. In the process of prepping the overlying tissue was unroofed and purulence began draining. A culture swab was obtained.     15 mL of 1% lidocaine was injected intradermally surrounding the abscess. The remaining overlying necrotic tissue was removed bluntly. All remaining purulence was drained and the wound was irrigated with sterile saline. The wound was probed and tracked ~ 1 cm deep with no other tracking. The wound appeared hemostatic. The wound was packed with gauze and covered with a dressing.     The patient was instructed on dressing changes and deemed suitable for discharge to home with planned follow up and a regimen of Augmentin for 10 days.     I was present during all critical and key portions of the procedure(s) and immediately available to furnish services the entire duration.  See resident note for details. Aida Raider, MD

## 2018-02-22 NOTE — Unmapped (Signed)
Family at bedside. Patient resting and watching TV - Waiting for surgical consult. Patient updated

## 2018-02-22 NOTE — Unmapped (Signed)
Surgery here to evaluate patient.

## 2018-02-22 NOTE — Unmapped (Signed)
Received patient in the room with family at bedside. With antibiotic to infused. Up for discharge.

## 2018-02-22 NOTE — Unmapped (Signed)
Instructed patient on home medication, follow up to PCP and reading materials, verbalized understanding.

## 2018-04-15 NOTE — Unmapped (Signed)
Reason for call: Patient canceled appointment with Provider on 01/08/18  ??  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  ??  Patient needs new appointment with Provider and will need to complete Mfr assistance application for Mavyret. I called patient at phone number [phone], someone answered and hung up the phone. I called back and left a message for patient to please return call. Contact information provided.     Vertell Limber RN, BSN  Nursing Care Coordinator   Pharmacy Adult GI Medicine  [facility]  [address]   [place], [place] [address]  [phone]

## 2018-05-12 NOTE — Unmapped (Signed)
Reason for call: Patient canceled appointment with Provider on 01/08/18  ??  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  ??  Patient needs new appointment with Provider and will need to complete Mfr assistance application for Mavyret. Called pt at phone number [phone] and left a VM request for pt to return call. Contact information provided. I called and spoke with pt's mother [name] at phone number [phone] and she stated Venicia does not live there but I may be able to be reach her if I call her son Greig Castilla at phone number [phone]. I tried to call Greig Castilla but no mailbox has been set up and unable to leave a VM.       Vertell Limber RN, BSN  Nursing Care Coordinator   Pharmacy Adult GI Medicine  [facility]  [address]   [place], [place] [address]  [phone]

## 2018-06-12 NOTE — Unmapped (Signed)
Reason for call: Patient canceled appointment with Provider on 01/08/18  ??  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  ??  Patient needs new appointment with Provider and will need to complete Mfr assistance application for Mavyret. I was able to get in touch with pt today at the phone number her mother provided [phone]). I spoke with pt and she was agreeable to new appointment on 06/16/18 @ 1430 with Owens Shark, DNP. Pt will need a new MFR assistance application for Mavyret. Pt provided her new phone number [phone]. Sent pt the apt date and address to clinic via text. Stressed the importance to the apt. Pt verbalized understanding.     Vertell Limber RN, BSN  Nursing Care Coordinator   Pharmacy Adult GI Medicine  [facility]  [address]   [place], [place] [address]  [phone]

## 2018-06-24 NOTE — Unmapped (Signed)
Reason for call: Patient was a no show to  appointment scheduled on 06/16/18  ??  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  ??  Pt was a no show to apt scheduled on 06/16/18, even after reminder text was provided per pt request. Called pt at phone number [phone] to coordinate a new appointment. There was no answer and mailbox has not been set up. Sent a request via text for pt to return call to the office. Contact information provided. Pt will need a new MFR assistance application for Mavyret    Vertell Limber RN, BSN  Nursing Care Coordinator   Pharmacy Adult GI Medicine  [facility]  [address]   [place], [place] [address]  [phone]

## 2018-07-09 NOTE — Unmapped (Signed)
Reason for call: Patient was a no show to  appointment scheduled on 06/16/18  ??  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  ??  Pt was a no show to apt scheduled on 06/16/18. Called pt at phone number [phone] to coordinate a new appointment. There was no answer and mailbox has not been set up. Pt will need a new MFR assistance application for Mavyret when new apt is arranged.       Vertell Limber RN, BSN  Nursing Care Coordinator   Pharmacy Adult GI Medicine  [facility]  [address]   [place], [place] [address]  [phone]

## 2018-08-05 NOTE — Unmapped (Signed)
Reason for call: Patient??was a no show to????appointment scheduled on 06/16/18  ??  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  ??  Pt was a no show to apt scheduled on 06/16/18. Made another attempt to reach pt at phone number [phone]. I was unable to leave a VM because mailbox has not been set up. Pt will need a new MFR assistance application for Mavyret when new apt is arranged.     Vertell Limber RN, BSN  Nursing Care Coordinator   Pharmacy Adult GI Medicine  [facility]  [address]   [place], [place] [address]  [phone]

## 2018-09-17 NOTE — Unmapped (Signed)
Reason for call: Patient was a no show to  appointment scheduled on 06/16/18  ??  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  ??  Pt was a no show to apt scheduled on 06/16/18, even after reminder text was provided per pt request. Called pt at phone number [phone] but unable to leave a VM. Message stated that caller has phone restrictions.     Vertell Limber RN, BSN  Nursing Care Coordinator   Pharmacy Adult GI Medicine  [facility]  [address]   [place], [place] [address]  [phone]

## 2018-10-15 NOTE — Unmapped (Addendum)
Reason for call: Patient??was a no show to??appointment scheduled on 06/16/18  ??  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    Pt was a no show to apt scheduled on 06/16/18. Called and spoke with pt at phone number [phone]. Stressed the importance of follow up for Hep C tx. New appointment coordinated for Dec 4th at 2:30 PM with Owens Shark, DNP. Pt provided her new mailing address: [address], [place], [place] [address]. Pt stated she is not working and does not have insurance. MFR assistance application + [employer] Application was sent to pt's new address. Pt instructed to bring the completed forms with her to the appointment. Pt verbalized understanding.       Vertell Limber RN, BSN  Nursing Care Coordinator   Pharmacy Adult GI Medicine  [facility]  [address]   [place], [place] [address]  [phone]

## 2018-11-25 NOTE — Unmapped (Signed)
Reason for call: Reminder call for apt scheduled on 11/26/18 @ 2:30 PM  ??  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    Reminder call placed to pt at phone number [phone] for apt scheduled on 11/26/18 @ 2:30 PM with Owens Shark, DNP. The phone number [phone] has been disconnected. I called [phone] but the VM box has not been set up yet. Pt was mailed a [facility] application and MFR assistance application and  instructed to bring it with her to appointment.     Vertell Limber RN, BSN  Nursing Care Coordinator   Pharmacy Adult GI Medicine  [facility]  [address]   [place], [place] [address]  [phone]

## 2018-12-04 NOTE — Unmapped (Signed)
Reason for call: Pt was a no show to apt scheduled on 11/26/18  ??  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    Pt was a no show to apt scheduled on 11/26/18. Called pt at phone number [phone] but unable to leave a VM. Phone number [phone] has been disconnected. I called pt's mother [name] at phone number [phone] and left a VM request to please have her daughter return call. Contact information provided. Pt will need to bring [facility] Application to her next apt.     Vertell Limber RN, BSN  Nursing Care Coordinator   Pharmacy Adult GI Medicine  [facility]  [address]   [place], [place] [address]  [phone]

## 2018-12-10 NOTE — Unmapped (Signed)
Reason for call:??Pt was a no show to apt scheduled on 11/26/18  ??  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  ??  Pt was a no show to apt scheduled on 11/26/18. Called pt at phone number [phone] but the number has calling restrictions, unable to leave VM. Phone number [phone] has been disconnected. I called pt's mother [name] at phone number [phone] and left a VM request to have her daughter return my call. Contact information provided.     Vertell Limber RN, BSN  Nursing Care Coordinator   Pharmacy Adult GI Medicine  [facility]  [address]   [place], [place] [address]  [phone]

## 2019-01-14 NOTE — Unmapped (Signed)
Reason for call:??Pt was a no show to apt scheduled on 11/26/18  ??  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  ??  Pt was a no show to apt scheduled on 11/26/18. Called pt at phone number [phone] and left a VM request for pt to return call. I also called phone number [phone] but the mailbox has not been set up yet. I also called pt's mother [name] at phone number [phone] and left a VM request to have Kaneshia return call. Contact information provided.       Vertell Limber RN, BSN  Nursing Care Coordinator   Pharmacy Adult GI Medicine  [facility]  [address]   [place], [place] [address]  [phone]

## 2019-01-27 NOTE — Unmapped (Signed)
Reason for call:??Pt was a no show to apt scheduled on 11/26/18  ??  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  ??  Pt was a no show to apt scheduled on 11/26/18. Called pt at phone number [phone] & [phone] but the numbers have calling restrictions. I called and spoke with pt's father at phone number [phone]. Request made to please have his daughter return call. Contact information provided.     Vertell Limber RN, BSN  Nursing Care Coordinator   Pharmacy Adult GI Medicine  [facility]  [address]   [place], [place] [address]  [phone]

## 2019-02-17 NOTE — Unmapped (Signed)
Reason for call:??Pt was a no show to apt scheduled on 11/26/18  ??  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  ??  Pt was a no show to apt scheduled on 11/26/18. Called pt at phone number [phone] & [phone] but the number has been disconnected. I called pt's mother [name] at phone number [phone] and left a VM request to please have her daughter return my call. Contact information provided.       Vertell Limber RN, BSN  Nursing Care Coordinator   Pharmacy Adult GI Medicine  [facility]  [address]   [place], [place] [address]  [phone]

## 2019-03-06 NOTE — Unmapped (Signed)
Returned pt's VM. Pt reports since last clinic visit with [place] back in 09/2017, pt started using again thus did not follow up. However, she reports she's clean again and currently at a Rehab facility, [facility] in [place]. She is planning to move back to [facility] area in May and would like to reschedule to get evaluated for treatment again. Advised I will have scheduler reach out to her. Pt would like to update address to her mother's place: [address] [place], [place] [address]

## 2019-04-27 NOTE — Unmapped (Signed)
Pt followed up to request for appointment. Reports she had to leave rehab due to Covid but has been staying clean/doing well. I had previously sent request to [facility] on 03/06/19 but no appt set up so far. Advised I will send request to scheduling again. Advised due to Covid, clinic is not seeing pts face to face but will be able to set up Phone visit. Pt in agreement. She would also like to apply to [facility]. Will send pt application.

## 2019-06-02 NOTE — Unmapped (Signed)
Reason for call:??Coordinate new apt & make sure pt has received [facility] Application    Hep C  Genotype: 3 (09/03/17)  Planned Regimen: Mavyret x 8 weeks  Fibrosis: F0-F1/5.2kPa Fibroscan on 10/08/17????  ??  I called at both phone numbers listed [phone] & [phone] but the numbers are no longer in service. I called pt's mother [name] at phone number [phone] and left a VM request to please have [place] return call. Contact number provided.       Vertell Limber RN, BSN  Nursing Care Coordinator   Pharmacy Adult GI Medicine  [facility]  [address]   [place], [place] [address]  [phone]

## 2019-06-11 NOTE — Unmapped (Signed)
Reason for call:??Coordinate new apt for Hep C tx and evaluation  ??  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  ??  Pt was a no show to apt scheduled on 11/26/18. Called pt at phone number [phone] & [phone] but the numbers are no longer working. I also called phone number [phone] but the mail box has not been set up yet.        Vertell Limber RN, BSN  Nursing Care Coordinator   Pharmacy Adult GI Medicine  [facility]  [address]   [place], [place] [address]  [phone]

## 2019-09-09 NOTE — Unmapped (Signed)
Reason for call:??Coordinate new apt for treatment evaluation for Hep C  ??  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    I called and spoke with pt at phone number [phone]. Pt is currently residing at the [facility] and has been clean for the last 8 months. I congratulated pt on her success. I stressed the importance of being treated for Hep C to prevent liver complications. Pt is agreeable to an apt but does not have insurance. Pt also works first shift and requested the latest possible appointment time. Pt's new address is [address]. [place], [place] [address]. I will send pt a [facility] Application in the mail so she can drop it off after her apt at [facility]. Pt will also need to sign the Manufacturers Assistance Application while she is at her apt. New apt coordinated for Oct 12th @ 2:30 PM with Owens Shark, DNP. Pt thanked me for the call and the assistance.     Vertell Limber RN, BSN  Nursing Care Coordinator   Pharmacy Adult GI Medicine  [facility]  [address]   [place], [place] [address]  [phone]

## 2019-10-05 ENCOUNTER — Ambulatory Visit: Admit: 2019-10-05 | Discharge: 2019-10-06 | Attending: Family | Primary: Family

## 2019-10-05 DIAGNOSIS — Z23 Encounter for immunization: Principal | ICD-10-CM

## 2019-10-05 DIAGNOSIS — B182 Chronic viral hepatitis C: Principal | ICD-10-CM

## 2019-10-05 DIAGNOSIS — Z114 Encounter for screening for human immunodeficiency virus [HIV]: Principal | ICD-10-CM

## 2019-10-05 DIAGNOSIS — N611 Abscess of the breast and nipple: Principal | ICD-10-CM

## 2019-10-05 LAB — CBC W/ AUTO DIFF
BASOPHILS ABSOLUTE COUNT: 0 10*9/L (ref 0.0–0.1)
BASOPHILS RELATIVE PERCENT: 0.4 %
EOSINOPHILS ABSOLUTE COUNT: 1.4 10*9/L — ABNORMAL HIGH (ref 0.0–0.4)
EOSINOPHILS RELATIVE PERCENT: 13.2 %
HEMATOCRIT: 42.9 % (ref 36.0–46.0)
HEMOGLOBIN: 13.5 g/dL (ref 12.0–16.0)
LARGE UNSTAINED CELLS: 1 % (ref 0–4)
LYMPHOCYTES ABSOLUTE COUNT: 2.6 10*9/L (ref 1.5–5.0)
LYMPHOCYTES RELATIVE PERCENT: 25.3 %
MEAN CORPUSCULAR HEMOGLOBIN CONC: 31.4 g/dL (ref 31.0–37.0)
MEAN CORPUSCULAR HEMOGLOBIN: 30.2 pg (ref 26.0–34.0)
MEAN CORPUSCULAR VOLUME: 96.2 fL (ref 80.0–100.0)
MONOCYTES RELATIVE PERCENT: 5.4 %
NEUTROPHILS ABSOLUTE COUNT: 5.6 10*9/L (ref 2.0–7.5)
NEUTROPHILS RELATIVE PERCENT: 54.5 %
PLATELET COUNT: 371 10*9/L (ref 150–440)
RED BLOOD CELL COUNT: 4.46 10*12/L (ref 4.00–5.20)
RED CELL DISTRIBUTION WIDTH: 13.6 % (ref 12.0–15.0)
WBC ADJUSTED: 10.2 10*9/L (ref 4.5–11.0)

## 2019-10-05 LAB — ALT (SGPT): Alanine aminotransferase:CCnc:Pt:Ser/Plas:Qn:: 27

## 2019-10-05 LAB — BASIC METABOLIC PANEL
ANION GAP: 13 mmol/L (ref 7–15)
BLOOD UREA NITROGEN: 10 mg/dL (ref 7–21)
BUN / CREAT RATIO: 14
CALCIUM: 9.9 mg/dL (ref 8.5–10.2)
CO2: 23 mmol/L (ref 22.0–30.0)
CREATININE: 0.73 mg/dL (ref 0.60–1.00)
EGFR CKD-EPI AA FEMALE: >90 mL/min/{1.73_m2} (ref >=60)
EGFR CKD-EPI NON-AA FEMALE: >90 mL/min/{1.73_m2} (ref >=60)
GLUCOSE RANDOM: 89 mg/dL (ref 70–179)
POTASSIUM: 4.4 mmol/L (ref 3.5–5.0)

## 2019-10-05 LAB — HEPATIC FUNCTION PANEL
AST (SGOT): 31 U/L (ref 14–38)
BILIRUBIN DIRECT: 0.2 mg/dL (ref 0.00–0.40)
BILIRUBIN TOTAL: 0.6 mg/dL (ref 0.0–1.2)
PROTEIN TOTAL: 7.5 g/dL (ref 6.5–8.3)

## 2019-10-05 LAB — PROTIME-INR: INR: 1.02

## 2019-10-05 LAB — PROTIME: Coagulation tissue factor induced:Time:Pt:PPP:Qn:Coag: 11.7

## 2019-10-05 LAB — BUN / CREAT RATIO: Urea nitrogen/Creatinine:MRto:Pt:Ser/Plas:Qn:: 14

## 2019-10-05 LAB — HEMOGLOBIN: Hemoglobin:MCnc:Pt:Bld:Qn:: 13.5

## 2019-10-05 NOTE — Unmapped (Signed)
1.  Laboratory studies done today as part of HCV care.   2.  Office follow up three months, tentative follow up visit.   3.  Second hepatitis A vaccine administered today.   4.  Flu vaccine administered today.   5.  Make sure follow up with Kenard Gower as we discussed.   6.  Any questions or concerns please notify our office.

## 2019-10-05 NOTE — Unmapped (Signed)
[facility]    Lori Bowers, M.D.  Professor of Medicine  Director, [facility]  [place] [facility] [place] [facility] [place]    [phone] Lori Gails, FNP  [address]  [id number]  [place],  [place] [address]     Chief complaint: Reevalution and management for chronic hepatitis C genotype 3  Mavyret x 8 weeks   Fibrosis: F0-F1/5.2kPa Fibroscan on 10/08/17????    Present illness: Patient is a 33 y.o. Caucasian female with chronic hepatitis C genotype 3. The patient states she was diagnosed in May 2018 when she entered detox program. Interestingly she states she was negative for hepatitis C in December 2017. Her risk factors a period of injecting drug use for about 7 years which she had stopped 6 months ago prior to her visit with Dr. [name] Mt on 09/2017. Today she admits she had fallen out of care due to resumed IV use. 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 No alcohol use for the past 68 days. 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 She is able to see her two children on the weekends (Ages 48 and 29 yo).     Interim History:  She is treatment naive. Previously she was determined to have genotype 3. Will repeat genotype today just to make sure given her resumed IV use. Overall, she has been doing very well from hepatic standpoint. Her biggest complaint is recurrent left breast abscess. She denies continued follow up by surgery other than seen in ED. She never had acute icteric hepatitis.     ROS: All systems reviewed and negative other than that is stated above under HPI.    Past medical history:  1. History of depression with anxiety   2. Status post tubal ligation  3. Possible thyroid disease in the past but she does not recall any details  4. Hypertension     Liver Section:  1. Chronic hepatitis C, treatment naive, genotype 3 2. FIBROSCAN 09/2017: 5.2 KPS c/w stage F1 fibrosis  3. Immune to hepatitis B (hepatitis B surface antigen negative, anti-HBs positive)  4. No immunity to hepatitis A: First dose of hepatitis A vaccine A she may progress or 09/2017.#2 vaccine today (10/05/2019).     No Known Allergies    Current Outpatient Medications   Medication Sig Dispense Refill   ??? ibuprofen (MOTRIN) 400 MG tablet Take 400 mg by mouth every six (6) hours as needed for pain.       Current Facility-Administered Medications   Medication Dose Route Frequency Provider Last Rate Last Dose   ??? levonorgestrel (MIRENA) 20 mcg/24 hr (5 years) IUD 1 kit  1 each Intrauterine Once Lucianne Sullivan Lone, MD       BP medication, unknown name.     Social history: Residing [employer]. Involved romantic relationship, Lori Bowers (patient of our clinic). + HCV with possible txment failure history, recently completed treatment. Detectable VL at EOT. Two children. Employed at [employer]. No IV use x 10 months. No alcohol use for past 68 days. Smoking 1/2 pack/day.     Family history:   Family History   Problem Relation Age of Onset   ??? Hypertension Mother    ??? Plantar fasciitis Mother    ??? Arthritis Mother    ??? Lung cancer Father    ???  Hypertension Sister    ??? Hypertension Brother    ??? Hypertension Brother      Physical Examination:   Wt 66.7 kg (147 lb)  - BMI 27.79 kg/m??   Pleasant individual in NAD  HEENT: Sclera are anicteric, no temporal muscle loss, oropharynx is negative  NECK: No thyromegaly or lymphadenopathy, No carotid bruits  Chest: Clear to auscultation and percussion  Heart: S1, S2, RR, No murmurs  Breast: Deferred examination  Abdomen: Soft, non-tender, non-distended, no hepatosplenomegaly, no masses appreciated, no ascites  Skin: No spider angiomata, No rashes  Extremities: Without pedal edema, multiple tattoos, palmar erythema  Neuro: Grossly intact, No focal deficits    Laboratory Studies: Results for orders placed or performed in visit on 10/05/19   PT-INR   Result Value Ref Range    PT 11.7 10.2 - 13.1 sec    INR 1.02    Hepatic Function Panel   Result Value Ref Range    Albumin 4.4 3.5 - 5.0 g/dL    Total Protein 7.5 6.5 - 8.3 g/dL    Total Bilirubin 0.6 0.0 - 1.2 mg/dL    Bilirubin, Direct 1.61 0.00 - 0.40 mg/dL    AST 31 14 - 38 U/L    ALT 27 <35 U/L    Alkaline Phosphatase 69 38 - 126 U/L   Basic metabolic panel   Result Value Ref Range    Sodium 141 135 - 145 mmol/L    Potassium 4.4 3.5 - 5.0 mmol/L    Chloride 105 98 - 107 mmol/L    CO2 23.0 22.0 - 30.0 mmol/L    Anion Gap 13 7 - 15 mmol/L    BUN 10 7 - 21 mg/dL    Creatinine 0.96 0.45 - 1.00 mg/dL    BUN/Creatinine Ratio 14     EGFR CKD-EPI Non-African American, Female >90 >=60 mL/min/1.68m2    EGFR CKD-EPI African American, Female >90 >=60 mL/min/1.37m2    Glucose 89 70 - 179 mg/dL    Calcium 9.9 8.5 - 40.9 mg/dL   CBC w/ Differential   Result Value Ref Range    WBC 10.2 4.5 - 11.0 10*9/L    RBC 4.46 4.00 - 5.20 10*12/L    HGB 13.5 12.0 - 16.0 g/dL    HCT 81.1 91.4 - 78.2 %    MCV 96.2 80.0 - 100.0 fL    MCH 30.2 26.0 - 34.0 pg    MCHC 31.4 31.0 - 37.0 g/dL    RDW 95.6 21.3 - 08.6 %    MPV 8.5 7.0 - 10.0 fL    Platelet 371 150 - 440 10*9/L    Neutrophils % 54.5 %    Lymphocytes % 25.3 %    Monocytes % 5.4 %    Eosinophils % 13.2 %    Basophils % 0.4 %    Absolute Neutrophils 5.6 2.0 - 7.5 10*9/L    Absolute Lymphocytes 2.6 1.5 - 5.0 10*9/L    Absolute Monocytes 0.6 0.2 - 0.8 10*9/L    Absolute Eosinophils 1.4 (H) 0.0 - 0.4 10*9/L    Absolute Basophils 0.0 0.0 - 0.1 10*9/L    Large Unstained Cells 1 0 - 4 %    Macrocytosis Slight (A) Not Present       Impression/Plan:  1. Chronic hepatitis C genotype 3. Patient likely has mild disease based upon her clinical status, laboratory data, and FIBROSCAN results. Today we readdressed the natural history of HCV infection, including the risks for progression to  cirrhosis, liver failure, liver cancer, and risks of hepatocellular carcinoma. Patient is aware of the need for continued follow-up and monitoring. We discussed the importance of remaining abstinent from alcohol due to additive effects on disease progression to cirrhosis. We discussed potential treatment options that include all-oral regimens with low rates of side effects and high rates of cure (sustained virological response). We discussed that there is risk of reinfection if she returns to using injecting drug use after hepatitis C was cured.The patient will be good candidate for treatment with medications of high rate of cure with low risk of side effects.     ~ HCV safety labs ordered.   ~ Tentative office follow up four months.   ~ No alcohol or substance use. 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   ~ Mfg patient assistance forms given.     2. Immunity hepatitis A and hepatitis B: + immunity hepatitis B. Second hepatitis A vaccine administered today.    3. Tobacco use: Smoking cessation discussed.     4. Transmission counseling: Instructed to speak with her current partner and validate his HCV cure status.     5. Recurrent breast abscess: Referral order placed for general surgery.     6. History of HTN: Will speak with local pharmacy for name of BP medication.     7. Anxiety and depression: Follow up with prescribing MD. Arby Barrette being placed back on Seroquel and Gabapentin.     8.  Flu vaccine administered today.     All patient's questions were answered to her satisfaction during clinic today.     Rodman Key, DNP, FNP-BC  [facility]  [address]  [place] [address]  Phone [phone]

## 2019-10-05 NOTE — Unmapped (Signed)
Returned call to pt for VM she left over weekend. Pt inquiring when her appt is. Advised her appt with Maudie Flakes is scheduled for today at 2:30pm. Pt reports she never received the [facility] application that was mailed last month to address [address]. [place], [place] [address]. Instructed pt to stop by Financial Counselor's office near GI Clinic when she arrives for her appt with Owens Shark. Advised if she needs any medications, she will also need to apply for Pharmacy Assistance Program (or Manufacturer Patient Assistance Program for HCV treatment). Pt verbalized understanding and thanked me for returning her call.

## 2019-10-05 NOTE — Unmapped (Signed)
Patient received Influenza and Hep A vaccine. Vaccine information given to pt.

## 2019-10-07 LAB — HCV RNA LOG10: Lab: 4.1 — ABNORMAL HIGH

## 2019-10-07 LAB — HEPATITIS C RNA, QUANTITATIVE, PCR

## 2019-10-07 NOTE — Unmapped (Signed)
Reason for call:??Clarify current medications with [facility]     Hep C  Genotype: 3 (09/03/17)  Planned Regimen: Mavyret x 8 weeks  Fibrosis: F0-F1/5.2kPa Fibroscan on 10/08/17????    Provider requested clarification of current medications pt is receiving from [facility] in [place], [place]. I called and spoke with Pharmacy technician at [facility] on [address] in [place], [place]. Pt is suppose to be on atenolol 25 mg once daily but has not filled her medication since April 3rd, 2020. Pt filled gabapentin 300 mg TID last in June. Will notify Owens Shark, DNP of findings.     Vertell Limber RN, BSN  Nursing Care Coordinator   Pharmacy Adult GI Medicine  [facility]  [address]   [place], [place] [address]  [phone]

## 2019-10-07 NOTE — Unmapped (Signed)
-----   Message from [facility], [place] sent at 10/07/2019 12:01 PM EDT -----  Regarding: Medication  Lori Bowers,    I saw [name] in clinic this week and she mentioned she is suppose to be taking BP medication. Debbie called her pharmacy and verified the name... Atentolol. She needs to take her medication as prescribed. It appears she has not filled RX since April?? Please stress to her the importance of taking her regularly prescribed medications please. Important prior to starting DAA txment.     Dawn

## 2019-10-08 LAB — HCV GENOTYPE

## 2019-10-09 LAB — HIV ANTIGEN/ANTIBODY COMBO: HIV 1+2 Ab+HIV1 p24 Ag:PrThr:Pt:Ser/Plas:Ord:IA: NONREACTIVE

## 2019-10-09 NOTE — Unmapped (Signed)
Reached out to pt to review HCV treatment but pt was at work and unable to talk on phone.  Will try again on Monday per pt's request.

## 2019-10-12 DIAGNOSIS — B182 Chronic viral hepatitis C: Principal | ICD-10-CM

## 2019-10-12 MED ORDER — GLECAPREVIR 100 MG-PIBRENTASVIR 40 MG TABLET: 3 | tablet | Freq: Every day | 1 refills | 28 days | Status: AC

## 2019-10-12 NOTE — Unmapped (Signed)
Per test claim for MAVYREY at the [facility], patient needs Medication Assistance Program for Manufacturer Assistance.

## 2019-10-12 NOTE — Unmapped (Signed)
Counseling for HCV treatment     Lori Bowers is 33 y.o. who is interested in starting HCV treatment. We reviewed treatment with Mavyret. Reviewed Manufacturer Patient Assistance Program (MPAP) application with patient which was given to pt during OV on 10/05/19. She reports she has them at home.  Instructed to email back to me. We discussed the authorization process of obtaining the medication through MPAP and that this may take some time.  Stressed importance of being able to be reached by phone.     She reports she still had not received charity care application in the mail. I will email pt the application at [email or web address]. Advised to turn this into financial counselor office near GI Medicine clinic as pt works at [employer])    B18.2 Hep C: yes    K74.60 Cirrhosis: no,   Child Pugh Score if applicable and for Medicaid pts: n/a  Z94.4 Liver Transplant: no    Genotype: 1a (10/05/19)  HCV RNA: 12,522IU/ml   Date: 10/05/19   Fibrosis score: F0 (5.2 kPa) on Fibroscan on 10/08/17  HIV Co-infection? Negative 10/05/19  Signs of liver decompensation? no  Previous treatment? no    Planned regimen: Mavyret (glecaprevir/pibrentasvir 100/40 mg) 3 tabs daily x 8 weeks  Urgency: Routine Request    Prescribing Provider/NPI: Dianne Dun, NP / [id number]  Supervising Physician/NPI: Dr. Gavin Potters / [id number]  Signature waiver form not obtained at this time.  Insurance: [place] [employer] Family Planning    Current medications:  Current Outpatient Medications   Medication Sig Dispense Refill   ??? ibuprofen (MOTRIN) 400 MG tablet Take 400 mg by mouth every six (6) hours as needed for pain.       Current Facility-Administered Medications   Medication Dose Route Frequency Provider Last Rate Last Dose   ??? levonorgestrel (MIRENA) 20 mcg/24 hr (5 years) IUD 1 kit  1 each Intrauterine Once Lucianne Sullivan Lone, MD             Following topics were discussed during counseling: 1. Indications for medication, dosage and administration.     A. Mavyret (100/40 mg) 3 tablets to take once daily with food.    2. Common side effects of medications and management strategies. (fatigue, headache)     A. Pregnancy precaution given: h/o tubal ligation and also on IUD to regulate her cycle. Reports her IUD will need to be replaced. Advised to make appointment for replacement IUD as soon as she can.    3. Importance of adherence to regimen, follow-up clinic visits and lab monitoring.     A.  Reviewed treatment follow up protocol including TW#4, EOT and Post-TW#12 to assess for cure.   4. Drug-drug interaction.    A. Current medications have been reviewed and assessed for possible interaction. Denies use of herbal medication such as milk thistle or St. John's wart.  Allergies have been verified. Denies alcohol. 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 Congratulated patient on her efforts.    5. Importance of informing pharmacy and clinic of updated contact information.  Discussed the process of obtaining medication through specialty pharmacy and when approved medication will be delivered to patient's home. Stressed importance of being able to be reached over the phone.    Patient verbalized understanding. Provided contact information for any questions/concerns.       Park Breed, Pharm D., BCPS, BCGP, CPP  [facility]  [address]  [place], [place] [address]  [phone]

## 2019-10-13 NOTE — Unmapped (Signed)
Attempted to contact patient by phone. Surgery clinic has tried to reach her without success to schedule appointment. My call went to voicemail and the voicemail is full.

## 2019-10-20 ENCOUNTER — Encounter: Admit: 2019-10-20 | Discharge: 2019-10-21 | Payer: MEDICAID | Attending: Family | Primary: Family

## 2019-10-20 DIAGNOSIS — N611 Abscess of the breast and nipple: Principal | ICD-10-CM

## 2019-10-20 NOTE — Unmapped (Signed)
Reason for call: Coordinate MFR assistance application, PAP + Charity Care application    Hep C  Genotype: 1a (10/05/19)  Planned treatment: Mavyret x 8 wks  Fibrosis: F0 (5.2 kPa) Fibroscan on 10/08/17    I called and spoke with pt at phone number [phone]. Discussed the importance of completing MFR assistance application for Hep C treatment. Pt stated she just got a job at [employer] in the [employer] Pt will not have insurance though for 90 days. Pt was tested for COVID-19 today d/t having a couple of the symptoms. Notified pt if she logs into [facility]  my chart she will be able to complete the [facility] application on line. Pt said she just signed up for my chart. Pt is able to take a picture of MFR assistance form and send it to me. Pt is still working on PAP application. Notified pt if she needed assistance to please give me a call. Provided my call back number. Pt thanked me for the assistance.   I received copy of beneficiary readiness form and forwarded it to [facility] with MAP team.     Vertell Limber RN, BSN  Nursing Care Coordinator   Pharmacy Adult GI Medicine  [facility]  [address]   [place], [place] [address]  [phone]

## 2019-10-20 NOTE — Unmapped (Signed)
10/20/19    Travel Screening Questions Completed.    Travel Screening Questions/Answers:  1).  n  2).  n  3).  n  4).  n      Negative Travel Screen: Patient answered NO to questions 2, 3, and 4. Offer Telephone visit, Virtual Visit or In Person Visit according to the current scheduling protocol for your clinic.       The call was handled in the following manner: Scheduled for In Person Visit     Discussed need for mask.     Patient has charity application coming to her from pharmacy.

## 2019-10-20 NOTE — Unmapped (Signed)
Assessment     Lori Bowers is a 33 y.o. female presenting to [facility] for COVID testing.     Problem List Items Addressed This Visit     None      Visit Diagnoses     Contact with and (suspected) exposure to other viral communicable diseases    -  Primary    Relevant Orders    COVID-19 PCR          Plan     If no testing performed, pt counseled on routine care for respiratory illness.  If testing performed, COVID sent.  Patient directed to Home given findings during today's visit.    Subjective     Lori Bowers is a 33 y.o. female who [facility] with complaints of the following:    Exposure History: In the last 21 days?     Have you traveled outside of [place]? No               Have you been in close contact with someone confirmed by a test to have COVID? (Close contact is within 6 feet for at least 10 minutes) Yes, Who? n/a       Have you worked in a health care facility? No, but noted [facility]     Lived or worked facility like a nursing home, group home, or assisted living?    Yes         Are you scheduled to have surgery or a procedure in the next 3 days? No               Are you scheduled to receive cancer chemotherapy within the next 7 days?    No     Have you ever been tested before for COVID-19 with a swab of your nose? 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 1   Chills (especially repeated shaking chills) No   Severe fatigue (felt very tired) No   Muscle aches No   Runny nose Yes, how many days? 2   Sore throat Yes, how many days? 3   Loss of taste or smell No   Cough (new onset or worsening of chronic cough) Yes, how many days? 5   Shortness of breath No   Nausea or vomiting No   Headache No Abdominal Pain No   Diarrhea (3 or more loose stools in last 24 hours) No     History/Medical Conditions (as stated by patient on intake form):    Do you have any of the following:   Asthma or emphysema or COPD No   Cystic Fibrosis No   Diabetes No   High Blood Pressure  Yes   Cardiovascular Disease No   Chronic Kidney Disease No   Chronic Liver Disease No   Chronic blood disorder like Sickle Cell Disease  No   Weak immune system due to disease or medication No   Neurologic condition that limits movement  No   Developmental delay - Moderate to Severe  No   Recent (within past 2 weeks) or current Pregnancy No   Morbid Obesity (>100 pounds  over ideal weight) No   Current Smoker Yes   Former Smoker Yes       Objective     Given above, testing performed: Yes    Testing Performed:  Test Specimen Type Sent to   COVID-19  NP Swab [facility]       Scribe's Attestation: Paulita Fujita, NP obtained and performed the history, physical exam and medical decision making elements that were entered into the chart.  Signed by Edison Simon serving as Scribe, on 10/20/2019 10:18 AM      The documentation recorded by the scribe accurately reflects the service I personally performed and the decisions made by me. Aida Puffer, FNP  October 20, 2019 11:35 AM

## 2019-10-22 ENCOUNTER — Encounter: Admit: 2019-10-22 | Discharge: 2019-10-23 | Payer: MEDICAID

## 2019-10-29 ENCOUNTER — Ambulatory Visit: Admit: 2019-10-29 | Discharge: 2019-10-30

## 2019-10-29 DIAGNOSIS — N611 Abscess of the breast and nipple: Principal | ICD-10-CM

## 2019-10-29 NOTE — Unmapped (Signed)
Patient Name: Lori Bowers  Patient Age: 33 y.o.  Encounter Date: 10/29/2019    Referring Physician:   Fawn Kirk, FNP  [address]  [id number]  [place],  [place] [address]    Primary Care Provider:  No PCP Per Patient    Supervising Physician  Dr. Debbrah Alar      Reason for Visit:   Chief Complaint   Patient presents with   ??? New Problem       HPI:    Lori Bowers is a 33 y.o. female who is seen in consultation at the request of Fawn Kirk* for recurring left breast abscess the patient states in her history that she sustained trauma to her left breast back in 2014 when she walked into a door.  She had subsequent breast abscess for which she had incision and drainage.  She is continued to have follow-up here at [facility] and had imaging this morning prior to me seeing her.  I reviewed images with Dr. Mayford Knife.  There is no drainable fluid collection no cellulitis in the area just below the surface of skin is 0.5 mm area of phlegmon.  She has no family history for breast cancer.  The patient is an every day smoker and smokes about 1/2 pack/day for more than 15 years.  I explained to her that women with recurrent abscesses who are smokers are more likely to have recurrence because of the nicotine effect on the healing tissue.    Review of Systems:  Is notable for some fatigue and left breast pain.    Reproductive History:  The patient is premenopausal she is a G2 P2 first pregnancy at age 21 menarche at 27    Medical History:  Past Medical History:   Diagnosis Date   ??? Anxiety    ??? Breast abrasion, left, sequela    ??? Hepatitis C    ??? Hypertension    ??? Injury of breast, left 2014    hit breast on door        Surgical History:  Past Surgical History:   Procedure Laterality Date   ??? PR DRAIN SKIN ABSCESS SIMPLE Left 11/03/2017 Procedure: INCISION & DRAINAGE OF ABSCESS; SIMPLE OR SINGLE;  Surgeon: Colon Branch, MD;  Location: [facility];  Service: General Surgery   ??? TONSILLECTOMY AND ADENOIDECTOMY     ??? TUBAL LIGATION         Family History:  Family History   Problem Relation Age of Onset   ??? Hypertension Mother    ??? Plantar fasciitis Mother    ??? Arthritis Mother    ??? Lung cancer Father    ??? Hypertension Sister    ??? Hypertension Brother    ??? Hypertension Brother        Social History:  Tobacco use:   Social History     Tobacco Use   Smoking Status Current Every Day Smoker   ??? Packs/day: 0.50   ??? Types: Cigarettes   Smokeless Tobacco Never Used     Alcohol use:   Social History     Substance and Sexual Activity   Alcohol Use Yes   ??? Alcohol/week: 2.0 standard drinks   ??? Types: 2 Glasses of wine per week    Comment: No alcohol use for 68 days.      Drug use:   Social History     Substance and Sexual Activity   Drug Use No   ???  Types: IV, Heroin    Comment: clean for 10 months     Living situation: Patient lives at home with her family she is single denies any alcohol or drug use    Medications:     Current Outpatient Medications:   ???  glecaprevir-pibrentasvir (MAVYRET) 100-40 mg tablet, Take 3 tablets by mouth daily with food., Disp: 84 tablet, Rfl: 1  ???  ibuprofen (MOTRIN) 400 MG tablet, Take 400 mg by mouth every six (6) hours as needed for pain., Disp: , Rfl:     Current Facility-Administered Medications:   ???  levonorgestrel (MIRENA) 20 mcg/24 hr (5 years) IUD 1 kit, 1 each, Intrauterine, Once, Lucianne Sullivan Lone, MD    Allergies:  has No Known Allergies.          Exam:  BSA: There is no height or weight on file to calculate BSA.  LMP 10/03/2019 (Approximate)   Pain Assessment: Pain scale 0    General Appearance:  No acute distress, well appearing and well nourished.   Head:  Normocephalic, atraumatic.   Eyes:  Conjuctiva and lids appear normal. Pupils equal and round,   sclera anicteric. Ears:  Overall appearance normal with no scars, lesions or               masses.  Hearing is grossly normal.   Nose: Not examined secondary to face mask for covid   Throat: Not examined secondary to face mask for covid   Breast:  Breast exam on the right has no skin change mass or nipple discharge.  In the left breast in the 12 o'clock position at the nipple areolar edge there is a 5 mm skin lesion that is scabbed.  No evidence for cellulitis.  There is no fluctuant areas.   Axilla  no adenopathy in the axilla bilaterally   Neck:  Neck is supple trachea midline no JVD is supraclavicular neuropathy   Pulmonary:    Normal respiratory effort   Cardiovascular:  Regular rate and rhythm per vital signs   Musculoskeletal: Normal gait.  Extremities without clubbing, cyanosis, or           edema.   Psychiatric: Judgement and insight appropriate.  Oriented to person,         place,  and time.       Diagnostic Studies:  @MAMMOFINDINGS @  Per HPI    Assessment:  Recurrent left breast abscess    Plan:  Today's visit the patient does not have any drainable fluid collection.  She does have skin changes where the lesion drains itself.  She uses warm compresses at home.  The last time she took an antibiotic was in early spring.  I recommended that she consider smoking cessation as a way to help prevent this from recurring.  I will see her on an as-needed basis for new problems      The note was transcribed by dragon and may contain errors in spelling

## 2019-10-29 NOTE — Unmapped (Signed)
Reason for call: follow up on income verification letter of support for [facility] assistance  ??  Hep C  Genotype: 1a (10/05/19)  Planned treatment: Mavyret x 8 wks  Fibrosis: F0 (5.2 kPa) Fibroscan on 10/08/17    I called pt at phone number [phone] and the message stated this caller has calling restrictions. I was unable to leave a VM.     Vertell Limber RN, BSN  Nursing Care Coordinator   Pharmacy Adult GI Medicine  [facility]  [address]   [place], [place] [address]  [phone]

## 2019-10-30 ENCOUNTER — Encounter: Admit: 2019-10-30 | Discharge: 2019-10-31 | Payer: MEDICAID

## 2019-10-30 ENCOUNTER — Encounter: Admit: 2019-10-30 | Discharge: 2019-10-30 | Disposition: A | Discharge status: Home / Self Care | Payer: MEDICAID

## 2019-10-30 DIAGNOSIS — K047 Periapical abscess without sinus: Principal | ICD-10-CM

## 2019-10-30 DIAGNOSIS — K089 Disorder of teeth and supporting structures, unspecified: Principal | ICD-10-CM

## 2019-10-30 DIAGNOSIS — K029 Dental caries, unspecified: Principal | ICD-10-CM

## 2019-10-30 DIAGNOSIS — M272 Inflammatory conditions of jaws: Principal | ICD-10-CM

## 2019-10-30 MED ORDER — AMOXICILLIN 500 MG CAPSULE
ORAL_CAPSULE | Freq: Three times a day (TID) | ORAL | 0 refills | 7 days | Status: CP
Start: 2019-10-30 — End: 2019-11-06
  Filled 2019-10-30: qty 21, 7d supply, fill #0

## 2019-10-30 MED FILL — AMOXICILLIN 500 MG CAPSULE: 7 days supply | Qty: 21 | Fill #0 | Status: AC

## 2019-10-30 NOTE — Unmapped (Signed)
Patient with a history of ulcers and abscesses presents with 10/10 R sided mouth pain. Tearful in triage. Tachycardic, hypertensive, tachypneic in triage.

## 2019-10-30 NOTE — Unmapped (Signed)
Reason for call: follow up on income verification letter of support for [facility] assistance  ??  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  I called pt at phone number [phone] and the message stated this caller has calling restrictions. I was unable to leave a VM. I sent pt a my chart message and left my contact information.     Vertell Limber RN, BSN  Nursing Care Coordinator   Pharmacy Adult GI Medicine  [facility]  [address]   [place], [place] [address]  [phone]

## 2019-10-30 NOTE — Unmapped (Signed)
[facility] Emergency Department Provider Note      ED Clinical Impression     Final diagnoses:   Hard palate abscess (Primary)   Poor dentition   Dental infection       Initial Impression, Assessment and Plan, and ED Course     33 y.o. female with a PMH of hepatitis C, poor dentition, anxiety, hypertension, tobacco use, polysubstance abuse (prior IV heroin user), and dental abscesses who presents to the ED for evaluation of an area of pain and swelling to the roof of her mouth for the past 2 days.      VSS on arrival, pt afebrile. Pt well appearing on exam, in NAD, non-toxic.  She is sitting comfortably.  She does appear slightly uncomfortable due to pain.  Cardiopulmonary exam notable for tachycardia, when questioned patient states that she is extremely nervous and in a lot of pain.  Patient has extremely poor dentition throughout, all teeth are decayed to the gumline.  She does have an approximate 1-1/2 cm x 1 cm area of swelling and pain with fluctuance noted to the hard palate.  She has no notable gingival abscess.  She has no trismus.  Normal voice.  She is talking in full sentences without difficulty, handling secretions without difficulty.  There is no drooling.  Airway is patent, no sublingual swelling.  No peritonsillar swelling.  There is no facial swelling or tenderness or erythema.    Exam findings are consistent with palatal abscess likely due to underlying dental infection secondary to poor dentition.  Plan to treat pain with 5 mg oxycodone and will attempt incision and drainage at bedside.  Will likely discharge with a course of antibiotics.    2:45 PM  I&D attempted, pt had difficulty tolerating the procedure therefore the procedure was stopped due to patient's request.  I did contact the [facility] clinic who will have patient come up for drainage and extraction of the affected tooth.  Will dose with 1 mg of Ativan, patient is actively crying and does appear quite anxious. 4:53 PM  Pt back from dental clinic.  Patient did have teeth numbers 7 and 8 extracted.  Per dentistry patient did have significant purulent drainage and palatal abscess did decompress after procedure.  Recommends patient use over-the-counter pain medications for any continued discomfort.  Recommends patient be discharged on a course of outpatient antibiotics.  Patient reassessed after procedure, does appear much improved.  States pain is relieved, she is very well-appearing without any current complaints.  Will discharge home and advise Tylenol and/or ibuprofen for any continued pain.  Will discharge with a 7-day course of amoxicillin 500 mg 3 times daily.  Patient states that her aunt is helping her obtain further dental care through the [facility] health clinic.  I have provided no other dental options.  HR rechecked and is 100.  Patient is stable for discharge at this time. PCP f/u advised. Strict return precautions have been discussed. Pt is agreeable with this plan and expresses understanding.       History     Chief Complaint  No chief complaint on file.      HPI   Patient was seen by me at 1:55 PM. Patient is a 33 y.o. female with a PMH of hepatitis C, poor dentition, anxiety, hypertension, tobacco use, polysubstance abuse (prior IV heroin user), and dental abscesses who presents to the ED for evaluation of an area of pain and swelling to the roof of her  mouth for the past 2 days.  Patient states she noticed swelling initially that has now worsened.  States that the roof of her mouth is extremely painful.  She has not noticed any drainage from it.  She states that she has very poor teeth overall and cannot tell if this is due to a dental infection.  She has been using ibuprofen as well as topical numbing ointment without much relief of symptoms.  She denies any difficulty opening her mouth, any drooling, voice changes, or fevers or chills.  She denies any facial swelling or difficulty breathing.  She denies any similar symptoms in the past.  Patient states that she is a recovering substance user and would like to avoid any prescription for narcotic pain medication.  She denies any other symptoms at this time.    Previous chart, nursing notes, and vital signs reviewed.      Pertinent labs & imaging results that were available during my care of the patient were reviewed by me and considered in my medical decision making (see chart for details).    Past Medical History:   Diagnosis Date   ??? Anxiety    ??? Brain cancer (CMS-HCC)    ??? Breast abrasion, left, sequela    ??? Breast cancer (CMS-HCC)    ??? Hepatitis C    ??? Hypertension    ??? Injury of breast, left 2014    hit breast on door        Past Surgical History:   Procedure Laterality Date   ??? BREAST LUMPECTOMY     ??? PR DRAIN SKIN ABSCESS SIMPLE Left 11/03/2017    Procedure: INCISION & DRAINAGE OF ABSCESS; SIMPLE OR SINGLE;  Surgeon: Colon Branch, MD;  [facility]: [facility];  Service: General Surgery   ??? TONSILLECTOMY AND ADENOIDECTOMY     ??? TUBAL LIGATION           Current Facility-Administered Medications: ???  levonorgestrel (MIRENA) 20 mcg/24 hr (5 years) IUD 1 kit, 1 each, Intrauterine, Once, Lucianne Sullivan Lone, MD    Current Outpatient Medications:   ???  amoxicillin (AMOXIL) 500 MG capsule, Take 1 capsule (500 mg total) by mouth Three (3) times a day for 7 days., Disp: 21 capsule, Rfl: 0  ???  glecaprevir-pibrentasvir (MAVYRET) 100-40 mg tablet, Take 3 tablets by mouth daily with food., Disp: 84 tablet, Rfl: 1  ???  ibuprofen (MOTRIN) 400 MG tablet, Take 400 mg by mouth every six (6) hours as needed for pain., Disp: , Rfl:     Allergies  Patient has no known allergies.    Family History   Problem Relation Age of Onset   ??? Hypertension Mother    ??? Plantar fasciitis Mother    ??? Arthritis Mother    ??? Lung cancer Father    ??? Hypertension Sister    ??? Hypertension Brother    ??? Hypertension Brother        Social History  Social History     Tobacco Use   ??? Smoking status: Current Every Day Smoker     Packs/day: 0.50     Types: Cigarettes   ??? Smokeless tobacco: Never Used   Substance Use Topics   ??? Alcohol use: Yes     Alcohol/week: 2.0 standard drinks     Types: 2 Glasses of wine per week     Comment: No alcohol use for 68 days.    ??? Drug use: Yes     Types: IV,  Heroin     Comment: clean for 10 months       Review of Systems    Review of Systems   Constitutional: Negative for chills and fever.   HENT: Positive for dental problem. Negative for facial swelling, trouble swallowing and voice change.         Pain and swelling to roof of mouth   Respiratory: Negative for shortness of breath.    Gastrointestinal: Positive for nausea (2/2 pain). Negative for vomiting.   All other systems reviewed and are negative.        Labs     Labs Reviewed - No data to display    Radiology     No results found.    Physical Exam     VITAL SIGNS:    Vitals:    10/30/19 1243   BP: 174/114   Pulse: 113   Resp: 24   Temp: 36.8 ??C (98.2 ??F)   TempSrc: Oral   SpO2: 100%   Weight: 66.7 kg (147 lb)       Physical Exam Vitals signs and nursing note reviewed.   Constitutional:       General: She is not in acute distress.     Appearance: She is well-developed. She is not ill-appearing, toxic-appearing or diaphoretic.      Comments: Pt well appearing on exam, in NAD, non-toxic.  She is sitting comfortably.  She does appear slightly uncomfortable due to pain.    HENT:      Head: Normocephalic and atraumatic.      Mouth/Throat:      Mouth: Mucous membranes are moist.      Pharynx: Oropharynx is clear.      Comments: Patient has extremely poor dentition throughout, all teeth are decayed to the gumline.  She does have an approximate 1-1/2 cm x 1 cm area of swelling and pain with fluctuance noted to the hard palate.  She has no notable gingival abscess.  She has no trismus.  Normal voice.  She is talking in full sentences without difficulty, handling secretions without difficulty.  There is no drooling.  Airway is patent, no sublingual swelling.  No peritonsillar swelling.  There is no facial swelling or tenderness or erythema.    Neck:      Musculoskeletal: Normal range of motion.   Cardiovascular:      Rate and Rhythm: Regular rhythm. Tachycardia present.      Heart sounds: Normal heart sounds. No murmur. No friction rub. No gallop.    Pulmonary:      Effort: Pulmonary effort is normal. No respiratory distress.      Breath sounds: Normal breath sounds. No stridor. No wheezing or rales.   Musculoskeletal: Normal range of motion.   Skin:     General: Skin is warm and dry.   Neurological:      Mental Status: She is alert and oriented to person, place, and time.   Psychiatric:         Behavior: Behavior normal.              Procedures     Incision/Drainage    Date/Time: 10/30/2019 2:45 PM  Performed by: Zachery Conch, PA  Authorized by: Zachery Conch, PA     Consent:     Consent obtained:  Verbal    Consent given by:  Patient    Risks discussed:  Pain and incomplete drainage  Location:     Type:  Abscess  Location:  Mouth Mouth location:  Palate  Anesthesia (see MAR for exact dosages):     Anesthesia method:  Topical application and local infiltration    Topical anesthetic:  Benzocaine gel    Local anesthetic:  Lidocaine 1% w/o epi  Procedure type:     Complexity:  Simple  Procedure details:     Needle aspiration: no      Incision types:  Stab incision and single straight    Scalpel blade:  11    Drainage:  Bloody    Drainage amount:  Scant    Wound treatment:  Wound left open  Post-procedure details:     Patient tolerance of procedure:  Procedure terminated at patient's request           Zachery Conch, PA  10/30/19 2056

## 2019-10-31 NOTE — Unmapped (Signed)
[facility] Dentistry  Limited/Emergency Examination   Dental Extraction        Procedure(s) completed today: limited exam, periapical radiograph, and extraction of teeth #7 and #8      Assessment/Plan     Lori ROSCH is a 33 y.o. female with history of HTN who presents via ED with general poor dentition, palatal swelling associated with tooth #7 and possibly #8 which are grossly carious and present with apical abscess.     Recommended extraction of #7 and #8.  Patient nervious but agreeable to the plan.      Recommendations     ?? OTC pain medications for pain management  ?? Amoxicillin 500 mg TID for 7-10 days  ?? Follow up with a dentist for comprehensive dental care      Subjective     History of Present Illness:    Lori Bowers is a 33 y.o. female with history of HTN presents to the hospital dental clinic with complaint of pain and swelling in the anterior maxilla.  She presented to the ED who consulted Hospital Dentistry for management after patient failed to tolerate I&D of the swelling.  She reports palatal swelling in the right anterior maxilla that is painful.  Patient is in tears as she is nervous about getting the teeth extracted. She has generally poor dentition and her aunt has been looking into getting care at [facility].      Patient Active Problem List   Diagnosis   ??? Chronic hepatitis C without hepatic coma (CMS-HCC)       Past Medical History:   Diagnosis Date   ??? Anxiety    ??? Brain cancer (CMS-HCC)    ??? Breast abrasion, left, sequela    ??? Breast cancer (CMS-HCC)    ??? Hepatitis C    ??? Hypertension    ??? Injury of breast, left 2014    hit breast on door          Current Facility-Administered Medications:   ???  levonorgestrel (MIRENA) 20 mcg/24 hr (5 years) IUD 1 kit, 1 each, Intrauterine, Once, Lucianne Sullivan Lone, MD    Current Outpatient Medications: ???  glecaprevir-pibrentasvir (MAVYRET) 100-40 mg tablet, Take 3 tablets by mouth daily with food., Disp: 84 tablet, Rfl: 1  ???  ibuprofen (MOTRIN) 400 MG tablet, Take 400 mg by mouth every six (6) hours as needed for pain., Disp: , Rfl:     Facility-Administered Medications Ordered in Other Visits:   ???  lidocaine (PF) (XYLOCAINE-MPF) 10 mg/mL (1 %) injection 10 mL, 10 mL, Infiltration, Once, [employer], PA    No Known Allergies    Objective     Vitals:    10/30/19 1536 10/30/19 1628   BP: 148/107 136/92   BP Site: R Arm R Arm   BP Position: Sitting Supine   BP Cuff Size: Medium Medium   Pulse: 88 91   Temp: 36.9 ??C (98.5 ??F)    SpO2: 100% 100%       Extraoral exam: facial symmetry present without any swelling, edema, or erythema.     Intraoral exam: Parulis present in the right anterior palate near #7 and #8.  Overlying tissue pink and intact.  Generalized poor dentition with multiple grossly carious teeth and remaining roots due to caries.      Radiographic examination: periapical    ? PARL associated with #7, 8, 9, and 10 noted    Procedure  Vitals pre- and post-op:   Vitals:    10/30/19 1536 10/30/19 1628   BP: 148/107 136/92   BP Site: R Arm R Arm   BP Position: Sitting Supine   BP Cuff Size: Medium Medium   Pulse: 88 91   Temp: 36.9 ??C (98.5 ??F)    SpO2: 100% 100%     Pre-procedure:  ? Treatment plan:  Extraction of #7 and #8  ? Time out completed: verified patient's name, DOB, and tooth/teeth to be extracted.    ? Explained treatment options to patient of extraction  ? Consent reviewed with patient/guardian and signed/phone witnessed consent.    Anesthesia:  ? Topical anesthesia (Benzocaine 20%) applied.   ? Type of Anesthesia used:  3 carpule Septocaine 4% with epinephrine 1:100,000 via infiltration and 1 carpule Mepivacaine 3% via infiltration  ? Aspiration negative    Procedure:  Attention turned to #7 and #8  Routine extraction: Soft tissue reflection with woodson and periosteal elevator.  Elevated with s/m/l elevators.  Delivered with straight dental forceps.  Socket curetted and rinsed with copious saline.  Purulence drainage noted from #7 with improvement of the swelling.  Hemostasis achieved with gauze pressure.    Although she was often in tears, she tolerated procedure well.  Verbal and written post-op instructions give to patient/caregiver.      Patient was satisfied with today's appointment and left in stable condition. All questions answered.      Provider:Tieara Flitton On Jaynie Collins, DDS, MS  Dental Assistant: Louanna Raw    Next Visit: PRN

## 2019-11-04 NOTE — Unmapped (Signed)
Reason for call:??follow up on income verification letter of support for [facility] assistance  ??  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  I made another attempt to reach pt at??phone number [phone]. The message stated there are calling restrictions on the phone and I was unable to leave a VM. Still waiting for income verification letter of support in order to send in [facility] assistance application for Mavyret.     Vertell Limber RN, BSN  Nursing Care Coordinator   Pharmacy Adult GI Medicine  [facility]  [address]   [place], [place] [address]  [phone]

## 2019-11-06 NOTE — Unmapped (Signed)
Reason for call:??follow up on income verification letter of support for [facility] assistance  ??  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  I made another attempt to reach pt at??phone number [phone]. The mailbox was full and I was unable to leave a VM. Still waiting for income verification letter of support in order to send in [facility] assistance application for Mavyret. I did sent pt a my chart message and requested for pt to please return my call. Contact number provided.     Vertell Limber RN, BSN  Nursing Care Coordinator   Pharmacy Adult GI Medicine  [facility]  [address]   [place], [place] [address]  [phone]

## 2019-11-09 NOTE — Unmapped (Signed)
Reason for call:??follow up on income verification letter of support for [facility] assistance  ??  Hep C  Genotype: 1a (10/05/19)  Planned treatment: Mavyret x 8 wks  Fibrosis:??F0 (5.2 kPa) Fibroscan on 10/08/17    I made another attempt to reach??pt at??phone number [phone]. The mailbox is still full and I was unable to leave a VM. Still waiting for income verification letter of support in order to send in [facility] assistance application for Mavyret.    Vertell Limber RN, BSN  Nursing Care Coordinator   Pharmacy Adult GI Medicine  [facility]  [address]   [place], [place] [address]  [phone]

## 2019-11-16 NOTE — Unmapped (Signed)
Reason for call:??follow up on income verification letter of support for [facility] assistance  ??  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  I made another attempt to reach??pt at??phone number [phone] but the mailbox is still full and I was unable to leave a VM. Pt will need to provide an income verification letter of support in order to move forward with Hep C treatment.     Vertell Limber RN, BSN  Nursing Care Coordinator   Pharmacy Adult GI Medicine  [facility]  [address]   [place], [place] [address]  [phone]

## 2019-11-20 NOTE — Unmapped (Signed)
Reason for call:??follow up on income verification letter of support for [facility] assistance  ??  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  I made another attempt to reach??pt at??phone number [phone] but the mailbox is still full and I was unable to leave a VM. Pt will need to provide an income verification letter of support in order to move forward with Hep C treatment.     Vertell Limber RN, BSN  Nursing Care Coordinator   Pharmacy Adult GI Medicine  [facility]  [address]   [place], [place] [address]  [phone]

## 2019-12-10 NOTE — Unmapped (Signed)
Reason for call:??follow up on income verification letter needed for [facility] assistance  ??  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  I made??another attempt to reach??pt at??phone number [phone]??but the mailbox is still full and I was unable to leave a VM. I also called [phone] but the mailbox has not been set up. I left a VM on pt's mother's VM with request to please have her daughter call me. I provided my contact information.     Vertell Limber RN, BSN  Nursing Care Coordinator   Pharmacy Adult GI Medicine  [facility]  [address]   [place], [place] [address]  [phone]

## 2019-12-29 ENCOUNTER — Encounter: Admit: 2019-12-29 | Discharge: 2019-12-30 | Payer: MEDICAID | Attending: Family | Primary: Family

## 2019-12-29 NOTE — Unmapped (Signed)
Assessment     Lori Bowers is a 34 y.o. female presenting to [facility] for COVID testing.     Problem List Items Addressed This Visit     None      Visit Diagnoses     Contact with or exposure to viral disease    -  Primary    Relevant Orders    COVID-19 PCR          Plan     If no testing performed, pt counseled on routine care for respiratory illness.  If testing performed, COVID sent.  Patient directed to Home given findings during today's visit.    Subjective     Lori Bowers is a 34 y.o. female who [facility] with complaints of the following:    Exposure History: In the last 21 days?     Have you traveled outside of [place]? No               Have you been in close contact with someone confirmed by a test to have COVID? 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    No         Are you scheduled to have surgery or a procedure in the next 3 days? No               Are you scheduled to receive cancer chemotherapy within the next 7 days?    No     Have you ever been tested before for COVID-19 with a swab of your nose? 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: Employer: n/a         Right now,  do you have any of the following that developed over the past 7 days (as stated by patient on intake form):    Subjective fever (felt feverish) No   Chills (especially repeated shaking chills) No   Severe fatigue (felt very tired) No   Muscle aches No   Runny nose No   Sore throat No   Loss of taste or smell No   Cough (new onset or worsening of chronic cough) No   Shortness of breath No   Nausea or vomiting No   Headache No   Abdominal Pain No   Diarrhea (3 or more loose stools in last 24 hours) No History/Medical Conditions (as stated by patient on intake form):    Do you have any of the following:   Asthma or emphysema or COPD No   Cystic Fibrosis No   Diabetes No   High Blood Pressure  Yes   Cardiovascular Disease No   Chronic Kidney Disease No   Chronic Liver Disease No   Chronic blood disorder like Sickle Cell Disease  No   Weak immune system due to disease or medication No   Neurologic condition that limits movement  No   Developmental delay - Moderate to Severe  No   Recent (within past 2 weeks) or current Pregnancy No   Morbid Obesity (>100 pounds over ideal weight) No   Current Smoker No   Former Smoker No  Objective     Given above, testing performed: Yes    Testing Performed:  Test Specimen Type Sent to   COVID-19  [facility]       Scribe's Attestation: Paulita Fujita, FNP obtained and performed the history, physical exam and medical decision making elements that were entered into the chart.  Signed by Edison Simon serving as Scribe, on 12/29/2019 11:48 AM      The documentation recorded by the scribe accurately reflects the service I personally performed and the decisions made by me. Aida Puffer, FNP  December 29, 2019 6:03 PM

## 2019-12-30 NOTE — Unmapped (Signed)
Reason for call:??follow up on income verification letter needed for [facility] assistance  ??  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  I made??another attempt to reach??pt at??phone number [phone]. The person who answered the phone said Henritta had stepped out for a few minutes and she will have her call me when she returns. Provided my contact information.       Vertell Limber RN, BSN  Nursing Care Coordinator   Pharmacy Adult GI Medicine  [facility]  [address]   [place], [place] [address]  [phone]

## 2019-12-31 NOTE — Unmapped (Signed)
Reason for call:??follow up on income verification letter??needed for??[facility] assistance  ??  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  I made??another attempt to reach??pt at??phone number [phone] but the mailbox is full and unable to leave a VM. Pt never returned my call yesterday.       Vertell Limber RN, BSN  Nursing Care Coordinator   Pharmacy Adult GI Medicine  [facility]  [address]   [place], [place] [address]  [phone]

## 2020-01-12 NOTE — Unmapped (Signed)
Reason for call:??follow up on income verification letter??needed for??M[facility] assistance  ??  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  I made??another attempt to reach??pt at??phone number [phone] but the mailbox continues to be full and unable to leave a VM.     Vertell Limber RN, BSN  Nursing Care Coordinator   Pharmacy Adult GI Medicine  [facility]  [address]   [place], [place] [address]  [phone]

## 2020-01-20 ENCOUNTER — Encounter: Admit: 2020-01-20 | Discharge: 2020-01-21 | Discharge status: Against Medical Advice | Payer: MEDICAID

## 2020-01-20 NOTE — Unmapped (Signed)
Pt states starting Saturday she had some upper left arm pain, that has progressed to her shoulder and neck on the left side. Pt unsure is she pulled a muscle, but denies any specific injury. States it hurts to move her arm, as well as move her neck.

## 2020-01-21 ENCOUNTER — Encounter
Admit: 2020-01-21 | Discharge: 2020-01-21 | Disposition: A | Discharge status: Home / Self Care | Payer: MEDICAID | Attending: Family

## 2020-01-21 DIAGNOSIS — M25512 Pain in left shoulder: Principal | ICD-10-CM

## 2020-01-21 MED ORDER — CYCLOBENZAPRINE 10 MG TABLET
ORAL_TABLET | Freq: Three times a day (TID) | ORAL | 0 refills | 7 days | Status: CP | PRN
Start: 2020-01-21 — End: ?

## 2020-01-21 MED ORDER — PREDNISONE 20 MG TABLET
ORAL_TABLET | Freq: Every day | ORAL | 0 refills | 5.00000 days | Status: CP
Start: 2020-01-21 — End: 2020-01-26

## 2020-01-21 NOTE — Unmapped (Signed)
No answer x2 and 3 from waiting room, pt LWBS

## 2020-01-21 NOTE — Unmapped (Signed)
No answer from waiting room X1

## 2020-01-22 NOTE — Unmapped (Signed)
[facility] Emergency Department Provider in Triage Encounter    Initial Impression, ED Course, Assessment and Plan       34 y.o. female presenting to the ED with left shoulder pain.  She also has some left chest wall tenderness.  Pain with range of motion left shoulder.  Positive Spurling test on the left.  The patient's EKG is negative.  This does not sound consistent with ACS.  The overall picture is quite mottled.  Parts of this sounds like cervical radiculopathy.  Parts of this sounds like rotator cuff tendinopathy.  Other syptoms sound like costochondritis.  Given the patient has had no success with NSAIDs will trial steroids.  Given muscle spasm will trial Flexeril.  Patient asked to try a shot of Toradol here.  We will give this as well.  Recommend range of motion exercises.  PCP follow-up information given to patient.  We will also give information for orthopedics.  Return precautions given as well as a work note.    Vital signs, pertinent labs & imaging results which were available during my care of the patient were reviewed by me (see chart for details).      History     Chief Complaint  Shoulder Pain      HPI   Lori Bowers is a 34 y.o. female  who presents to the ED with complaints of 3 days of left sided shoulder pain.  She says she initially felt she had some pain on her costochondral sternal junction area but then the pain seemed to move to her left trapezius.  She notes pain is worse with range of motion of the left shoulder.  She also has had some left-sided neck pain.  Has been trying ibuprofen, Aspercreme, and heat without relief.  No associated shortness of breath.  No history of similar problem.  No known injury.  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  No personal or family history of clotting disorder or early coronary disease.  No hormone use.  No lower extremity swelling.      Past Medical History:   Diagnosis Date   ??? Anxiety    ??? Brain cancer (CMS-HCC)    ??? Breast abrasion, left, sequela    ??? Breast cancer (CMS-HCC)    ??? Hepatitis C    ??? Hypertension    ??? Injury of breast, left 2014    hit breast on door        Past Surgical History:   Procedure Laterality Date   ??? BREAST LUMPECTOMY     ??? PR DRAIN SKIN ABSCESS SIMPLE Left 11/03/2017    Procedure: INCISION & DRAINAGE OF ABSCESS; SIMPLE OR SINGLE;  Surgeon: Colon Branch, MD;  [facility];  Service: General Surgery   ??? TONSILLECTOMY AND ADENOIDECTOMY     ??? TUBAL LIGATION           Current Facility-Administered Medications:   ???  ketorolac (TORADOL) injection 60 mg, 60 mg, Intramuscular, Once, Nicholes Calamity, FNP  ???  levonorgestrel (MIRENA) 20 mcg/24 hr (5 years) IUD 1 kit, 1 each, Intrauterine, Once, Lucianne Sullivan Lone, MD    Current Outpatient Medications:   ???  cyclobenzaprine (FLEXERIL) 10 MG tablet, Take 1 tablet (10 mg total) by mouth Three (3) times a day as needed for muscle spasms., Disp: 20 tablet, Rfl: 0  ???  glecaprevir-pibrentasvir (MAVYRET) 100-40 mg tablet, Take 3 tablets by mouth daily with food., Disp: 84 tablet, Rfl: 1  ???  ibuprofen (MOTRIN) 400 MG tablet, Take 400 mg by mouth every six (6) hours as needed for pain., Disp: , Rfl:   ???  predniSONE (DELTASONE) 20 MG tablet, Take 3 tablets (60 mg total) by mouth daily for 5 days., Disp: 15 tablet, Rfl: 0    Allergies  Patient has no known allergies.    Family History   Problem Relation Age of Onset   ??? Hypertension Mother    ??? Plantar fasciitis Mother    ??? Arthritis Mother    ??? Lung cancer Father    ??? Hypertension Sister    ??? Hypertension Brother    ??? Hypertension Brother        Social History  Social History     Tobacco Use   ??? Smoking status: Current Every Day Smoker     Packs/day: 0.50     Types: Cigarettes   ??? Smokeless tobacco: Never Used   Substance Use Topics   ??? Alcohol use: Yes     Alcohol/week: 2.0 standard drinks     Types: 2 Glasses of wine per week     Comment: No alcohol use for 68 days.    ??? Drug use: Yes     Types: IV, Heroin     Comment: clean for 10 months       Review of Systems  Constitutional: Negative for fever.  Respiratory: Negative for shortness of breath.  Gastrointestinal: Negative for abdominal pain,  Genitourinary: Negative for dysuria.   Musculoskeletal: See HPI.Marland Kitchen  See HPI.  10 point review of systems otherwise negative.       Physical Exam     VITAL SIGNS:    ED Triage Vitals [01/21/20 1626]   Enc Vitals Group      BP 147/101      Heart Rate 112      SpO2 Pulse       Resp 22      Temp 36.5 ??C (97.7 ??F)      Temp src       SpO2 97 %      Weight 70.3 kg (155 lb)       Constitutional:  Awake, alert, no apparent distress  HENT:  Normocephalic, atraumatic. Nose normal. no stridor.  Eyes:  EOMI, conjunctiva normal, no discharge.   Cardiovascular: Normal rate, regular rhythm. Normal and symmetric distal pulses are present in all extremities.  Respiratory: no respiratory distress, normal work of breathing.  Lungs clear to auscultation.  GI: nondistended.  Soft and nontender.  Integument:  No diaphoresis  Extremities: warm and well perfused  Musculoskeletal: Positive Spurling test on the left.  No midline tenderness.  There is some spasm of the left trapezius.  Pain with range of motion of the left shoulder.  Some tenderness on the left costochondral junction.  Neurologic:  Alert and appropriate  Psychiatric:  Affect normal, judgment normal, mood normal.       EKG     1712-NSR at rate of 96.  No ST or T wave abnormalities.  Normal axis.  No old EKG for comparison.         Junious Dresser [place], [place]  01/21/20 1754

## 2020-01-22 NOTE — Unmapped (Signed)
Patient was not in waiting room when called. They were never roomed or evaluated by me.     Sanjuana Kava., MD  Resident  01/22/20 [phone]

## 2020-01-22 NOTE — Unmapped (Signed)
Pt states starting Saturday she had some upper left arm pain, that has progressed to her shoulder and neck on the left side. Pt unsure is she pulled a muscle, but denies any specific injury. Thinks the pain is stemming from her shoulder.

## 2020-01-25 NOTE — Unmapped (Signed)
Reason for call:??follow up on income verification letter??needed for??MFR assistance  ??  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     Vertell Limber RN, BSN  Nursing Care Coordinator   Pharmacy Adult GI Medicine  [facility]  [address]   [place], [place] [address]  [phone]

## 2020-02-04 NOTE — Unmapped (Signed)
Reason for call:??follow up on income verification letter??needed for??MFR assistance  ??  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 Still attempting to reach pt to obtain income verification for [facility] assistance for Hepatitis C medication. ??    Vertell Limber RN, BSN  Nursing Care Coordinator   Pharmacy Adult GI Medicine  [facility]  [address]   [place], [place] [address]  [phone]

## 2020-02-12 NOTE — Unmapped (Signed)
Attempted to call pt to schedule covid vaccine.  No answer & vmail full, sent my chart message with employee vaccine line ph #.

## 2020-02-25 NOTE — Unmapped (Signed)
Reason for call:??follow up on income verification letter??needed for??MFR assistance  ??  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  I made another attempt to reach pt at phone number [phone] but I am still unable to leave a VM because mailbox is full. I sent pt another my chart message to please return my call. Contact number provided.       Vertell Limber RN, BSN  Nursing Care Coordinator   Pharmacy Adult GI Medicine  [facility]  [address]   [place], [place] [address]  [phone]

## 2020-04-26 NOTE — Unmapped (Signed)
Reason for call:??follow up on income verification letter??needed for??MFR assistance  ??  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  Multiple attempts have been made to reach pt at phone number [phone] but the mailbox is still full and I was unable to leave pt a VM. Pt will need to be seen in the clinic to make sure she is still interested in completing Hep C treatment. Pt will need to complete MFR assistance paper work and provide income verification unless she has new insurance.       Vertell Limber RN, BSN  Nursing Care Coordinator   Pharmacy Adult GI Medicine  [facility]  [address]   [place], [place] [address]  [phone]

## 2020-05-31 NOTE — Unmapped (Signed)
Reason for call:??Follow up on income verification letter??needed for??MFR assistance  ??  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  Multiple attempts have been made to reach pt at phone number [phone]. MAP team will need income verification letter in order to apply for [facility] assistance for HCV treatment with Mavyret x 8 wks. Pt had an apt with [employer] on 10/05/19 but we have not been able to reach pt to get the information needed to apply for [facility] assistance. I made another attempt today at phone # [phone] but the phone is not working.       Vertell Limber RN, BSN  Nursing Care Coordinator   Pharmacy Adult GI Medicine  [facility]  [address]   [place], [place] [address]  [phone]

## 2020-07-19 NOTE — Unmapped (Signed)
Reason for call:??Follow up on income verification letter??needed for??MFR assistance + new apt   ??  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  Multiple attempts have been made??to reach pt at phone number [phone]. I tried again but the mailbox is full and cannot accept any messages. MAP team will need income verification letter in order to apply for [facility] assistance for HCV treatment with Mavyret x 8 wks. Pt had an apt with Owens Shark on 10/05/19 but we have not been able to reach pt to get the information needed to apply for [facility] assistance. Since it has been so long pt will also need a new apt.       Vertell Limber RN, BSN  Nursing Care Coordinator   Pharmacy Adult GI Medicine  [facility]  [address]   [place], [place] [address]  [phone]

## 2020-09-13 NOTE — Unmapped (Signed)
Reason for call:??Coordinate new apt  ??  Hep C  Genotype: 1a (10/05/19)  Planned treatment: Mavyret x 8 wks  Fibrosis:??F0 (5.2 kPa) Fibroscan on 10/08/17    I called pt to coordinate a new apt for tx evaluation for HCV. I was able to reach pt at phone # [phone]. Pt stated  I am so glad you called me. Pt reports she has a new job at [employer] and would like to come back in to see Provider for treatment. Pt does not get off work until 3:00 pm and will not have insurance for 90 days. Pt also asked how to get her Hep A and Hep B records. Notified pt she can get them in my chart. I let her know she can go to immunizations records and get Hep A vaccine record and print out a copy of Hepatitis B labs to show she has immunity. Pt thanked me for the assistance. New apt coordinated on 12/20/20 @ 3:30 pm with Thea Gist. I will give pt a reminder call closer to her apt.       Vertell Limber RN, BSN  Nursing Care Coordinator   Pharmacy Adult GI Medicine  [facility]  [address]   [place], [place] [address]  [phone]

## 2020-12-19 NOTE — Unmapped (Signed)
Reason for call: Coordinate new apt    Hep C  Genotype: 1a (10/05/19)  Planned treatment: Mavyret x 8 wks  Fibrosis:??F0 (5.2 kPa) Fibroscan on 10/08/17    I called pt to coordinate new apt for treatment evaluation for HCV. Pt's apt was canceled on 12/20/20. Pt thanked me for the call and is ready to move forward to get this taken care of. New apt coordinated on 12/28/20 @ 9:30 am with Owens Shark, DNP.     Vertell Limber RN, [facility]   Pharmacy Department  [facility]  [address]   [place], [place] [address]  [phone]

## 2020-12-27 NOTE — Unmapped (Signed)
Reason for call: Pt called to report she has COVID and needs to reschedule her apt    Hep C  Genotype: 1a (10/05/19)  Planned treatment: Mavyret x 8 wks  Fibrosis:??F0 (5.2 kPa) Fibroscan on 10/08/17    I received a call from pt and she said she tested positive for COVID this past Sunday. Pt is running low grade temp, has cough, no taste or smell, and body aches. Pt stated she works at [employer] and there was an outbreak at her work, and she was around family and her mom recently tested positive. Pt would like to reschedule her apt. Notified pt I will call her at the end of the month to get her rescheduled. Pt thanked me for the assistance and feels bad she has to cancel her apt. I thanked pt for calling to let us know and wished pt a fast recovery. I sent a message to Christiane Ha to please cancel apt.       Vertell Limber RN, [facility]   Pharmacy Department  [facility]  [address]   [place], [place] [address]  [phone]

## 2021-01-18 NOTE — Unmapped (Addendum)
Reason for call:??Coordinate new apt  ??  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  I called pt to coordinate a new apt for tx evaluation for HCV. Pt had to cancel apt on 12/28/20 because she tested positive for COVID. I called pt at phone # [phone] but was not able to reach pt or leave a VM because mailbox was full.   I received a call back from pt. New apt coordinated on 02/09/21 @ 10:00 am with Owens Shark, DNP. Pt thanked me for the call.     Vertell Limber RN, [facility]   Pharmacy Department  [facility]  [address]   [place], [place] [address]  [phone]

## 2021-02-09 ENCOUNTER — Ambulatory Visit: Admit: 2021-02-09 | Discharge: 2021-02-09 | Payer: MEDICAID | Attending: Family | Primary: Family

## 2021-02-09 DIAGNOSIS — Z3200 Encounter for pregnancy test, result unknown: Principal | ICD-10-CM

## 2021-02-09 DIAGNOSIS — B182 Chronic viral hepatitis C: Principal | ICD-10-CM

## 2021-02-09 LAB — CBC W/ AUTO DIFF
BASOPHILS ABSOLUTE COUNT: 0.1 10*9/L (ref 0.0–0.1)
BASOPHILS RELATIVE PERCENT: 0.8 %
EOSINOPHILS ABSOLUTE COUNT: 1 10*9/L — ABNORMAL HIGH (ref 0.0–0.7)
EOSINOPHILS RELATIVE PERCENT: 9 %
HEMATOCRIT: 40 % (ref 35.0–44.0)
HEMOGLOBIN: 13.7 g/dL (ref 12.0–15.5)
LYMPHOCYTES ABSOLUTE COUNT: 2.7 10*9/L (ref 0.7–4.0)
LYMPHOCYTES RELATIVE PERCENT: 23.9 %
MEAN CORPUSCULAR HEMOGLOBIN CONC: 34.2 g/dL (ref 30.0–36.0)
MEAN CORPUSCULAR HEMOGLOBIN: 30.3 pg (ref 26.0–34.0)
MEAN CORPUSCULAR VOLUME: 88.4 fL (ref 82.0–98.0)
MEAN PLATELET VOLUME: 8.8 fL (ref 7.0–10.0)
MONOCYTES ABSOLUTE COUNT: 0.6 10*9/L (ref 0.1–1.0)
MONOCYTES RELATIVE PERCENT: 5.1 %
NEUTROPHILS ABSOLUTE COUNT: 7 10*9/L (ref 1.7–7.7)
NEUTROPHILS RELATIVE PERCENT: 61.2 %
NUCLEATED RED BLOOD CELLS: 0 /100{WBCs} (ref <=4)
PLATELET COUNT: 292 10*9/L (ref 150–450)
RED BLOOD CELL COUNT: 4.53 10*12/L (ref 3.90–5.03)
RED CELL DISTRIBUTION WIDTH: 12.7 % (ref 12.0–15.0)
WBC ADJUSTED: 11.4 10*9/L — ABNORMAL HIGH (ref 3.5–10.5)

## 2021-02-09 LAB — HEPATIC FUNCTION PANEL
ALBUMIN: 3.8 g/dL (ref 3.4–5.0)
ALKALINE PHOSPHATASE: 99 U/L (ref 46–116)
ALT (SGPT): 71 U/L — ABNORMAL HIGH (ref 10–49)
AST (SGOT): 45 U/L — ABNORMAL HIGH (ref <=34)
BILIRUBIN DIRECT: 0.1 mg/dL (ref 0.00–0.30)
BILIRUBIN TOTAL: 0.4 mg/dL (ref 0.3–1.2)
PROTEIN TOTAL: 7.5 g/dL (ref 5.7–8.2)

## 2021-02-09 LAB — BASIC METABOLIC PANEL
ANION GAP: 6 mmol/L (ref 5–14)
BLOOD UREA NITROGEN: 8 mg/dL — ABNORMAL LOW (ref 9–23)
BUN / CREAT RATIO: 15
CALCIUM: 9.7 mg/dL (ref 8.7–10.4)
CHLORIDE: 112 mmol/L — ABNORMAL HIGH (ref 98–107)
CO2: 19.1 mmol/L — ABNORMAL LOW (ref 20.0–31.0)
CREATININE: 0.54 mg/dL — ABNORMAL LOW
EGFR CKD-EPI AA FEMALE: >90 mL/min/{1.73_m2} (ref >=60)
EGFR CKD-EPI NON-AA FEMALE: >90 mL/min/{1.73_m2} (ref >=60)
GLUCOSE RANDOM: 83 mg/dL (ref 70–179)
POTASSIUM: 3.9 mmol/L (ref 3.4–4.5)
SODIUM: 137 mmol/L (ref 135–145)

## 2021-02-09 LAB — PREGNANCY, URINE: PREGNANCY TEST URINE: NEGATIVE

## 2021-02-09 NOTE — Unmapped (Signed)
1.  Laboratory studies ordered today.   2.  Office follow up two months tentatively as part of hepatitis C treatment plan.   3.  Park Breed, our clinical pharmacist will be in touch about medication management for treatment.   4.  Congratulations on remission.   5.  Any questions please let me know ~ [phone].

## 2021-02-10 DIAGNOSIS — B182 Chronic viral hepatitis C: Principal | ICD-10-CM

## 2021-02-10 LAB — HEPATITIS C RNA, QUANTITATIVE, PCR
HCV RNA LOG10: 4.98 {Log_IU}/mL — ABNORMAL HIGH (ref <0.00)
HCV RNA(IU): 96243 [IU]/mL — ABNORMAL HIGH (ref <=0)

## 2021-02-10 LAB — HEPATITIS B SURFACE ANTIGEN: HEPATITIS B SURFACE ANTIGEN: NONREACTIVE

## 2021-02-10 LAB — HEPATITIS A IGG: HEPATITIS A IGG: REACTIVE — AB

## 2021-02-10 LAB — HEPATITIS B SURFACE ANTIBODY
HEPATITIS B SURFACE ANTIBODY QUANT: 59.32 m[IU]/mL — ABNORMAL HIGH (ref <8.00)
HEPATITIS B SURFACE ANTIBODY: REACTIVE — AB

## 2021-02-10 LAB — HEPATITIS B CORE ANTIBODY, TOTAL: HEPATITIS B CORE TOTAL ANTIBODY: NONREACTIVE

## 2021-02-10 LAB — HIV ANTIGEN/ANTIBODY COMBO: HIV ANTIGEN/ANTIBODY COMBO: NONREACTIVE

## 2021-02-10 LAB — HEPATITIS C GENOTYPE

## 2021-02-10 MED ORDER — SOFOSBUVIR 400 MG-VELPATASVIR 100 MG TABLET
ORAL_TABLET | Freq: Every day | ORAL | 2 refills | 28.00000 days | Status: CP
Start: 2021-02-10 — End: ?

## 2021-02-10 NOTE — Unmapped (Signed)
Open in Error.

## 2021-02-10 NOTE — Unmapped (Signed)
Counseling for HCV treatment     Lori Bowers is 35 y.o. who presents over the phone and is interested in starting treatment with Mavyret. We discussed the prior authorization (PA) process of obtaining the medication through insurance and that this may take some time.      B18.2 Hep C: yes    K74.60 Cirrhosis: no,   Child Pugh Score if applicable and for Medicaid pts: n/a  Z94.4 Liver Transplant: no    Genotype: 1a (02/09/21)  HCV RNA: 96,243IU/ml on 02/09/21   Fibrosis score: F0 (5.2 kPa) on Fibroscan on 10/08/17  HIV Co-infection? nonreactive 10/05/19, pending 02/09/21  Signs of liver decompensation? no  Previous treatment? naive    Planned regimen: Mavyret (glecaprevir/pibrentasvir 100/40 mg) 3 tabs daily x 8 weeks  Urgency: Routine Request    Prescribing Provider/NPI: Dianne Dun, NP / [id number]  Supervising Physician/NPI: Dr. Gavin Potters / [id number]  Signature waiver form not obtained at this time.  Insurance: Community education officer    Current medications:  No current outpatient medications on file.     Current Facility-Administered Medications   Medication Dose Route Frequency Provider Last Rate Last Admin   ??? levonorgestrel (MIRENA) 20 mcg/24 hr (5 years) IUD 1 kit  1 each Intrauterine Once Lucianne Sullivan Lone, MD           Following topics were discussed during counseling:    1. Indications for medication, dosage and administration.     A. Mavyret (100/40 mg) 3 tablets to take once daily with food.      2. Common and rare side effects of medications and management strategies.   A. Fatigue and headache  B. Risk of hepatic decompensation/failure in patients with evidence of advanced liver disease.   C. Pregnancy precaution given: pt is s/p tubal ligation and also has IUD in place (07/2017).      3. Importance of adherence to regimen, follow-up clinic visits and lab monitoring.     A.  Reviewed treatment follow up protocol including TW#4 and Post-TW#12 to assess for cure.     4. Drug-drug interaction.    A. Current medications have been reviewed and assessed for possible interaction. Denies use of herbal medication such as milk thistle or St. John's wart.  Allergies have been verified. Denies any alcohol use for past 7 months.     5. Importance of informing pharmacy and clinic of updated contact information.  Discussed the process of obtaining medication through specialty pharmacy and when approved medication will be delivered to patient's home. Stressed importance of being able to be reached over the phone.    Patient verbalized understanding. Provided contact information for any questions/concerns.     Katrine Coho, PharmD candidate 2022    Park Breed, Pharm D., BCPS, BCGP, CPP  [facility]  [address]  [place], [place] [address]  [phone]

## 2021-02-10 NOTE — Unmapped (Signed)
Update for HCV Treatment:    Initial benefit investigation show that Mavyret is nonformulary on pt's new [employer] plan but brand name Dorita Fray is preferred. Reached pt back out to review Epclusa. Pt report her father-in-law just committed suicide and was not able to stay on phone long. Offered to call later but pt requested I proceed with nature of call. Advised of insurance plan's formulary status. Reviewed efficacy of Dorita Fray is comparable to Mavyret and is preferred on her plan. Reviewed administration of Epclusa and similar side effect profile. Pt in agreement with change of plan to Epclusa.     B18.2 Hep C: yes    K74.60 Cirrhosis: no,   Child Pugh Score if applicable and for Medicaid pts: n/a  Z94.4 Liver Transplant: no  ??  Genotype: 1a (10/05/19)  HCV RNA: 12,522   Date: 10/05/19; HCV RNA (02/09/21) pending  Fibrosis score: F0 (5.2 kPa) on Fibroscan on 10/08/17  HIV Co-infection? nonreactive 10/05/19  Signs of liver decompensation? no  Previous treatment? naive  ??  Planned regimen: Epclusa 1 tab daily x 12 weeks  Urgency: Routine Request  ??  Prescribing Provider/NPI: Dianne Dun, NP / [id number]  Supervising Physician/NPI: Dr. Gavin Potters / [id number]  Signature waiver form not obtained at this time.  Insurance: Otho Darner rx submitted.  ??  Lori Bowers, Pharm D., BCPS, BCGP, CPP  [facility]  [address]  [place], [place] [address]  [phone]

## 2021-02-10 NOTE — Unmapped (Signed)
[facility]  [place] [facility] [place] [facility] [place]  [phone]  ??  PCP: None  ??  Chief complaint: Evaluation and management HCV  Initial infection ~genotype 3  New infection ~ genotype 1A  Fibrosis: F0-F1/5.2kPa Fibroscan on 10/08/17????  ??  Present illness: Patient is a 35 y.o. Caucasian female with chronic hepatitis C. genotype 3. Review of patient's chart, specifically HCV genotyping results. It appears she was first infected with HCV genotype 3 and spontaneously cleared the virus. Genotype rechecked on October 05, 2019 during her visit in anticipation of proceeding forward with DAA treatment and genotype result was 1a. She was diagnosed with HCV during May of 2018 when she entered detox program. Interestingly she states she was negative for hepatitis C in December 2017. HCV risk factors are period of injecting drug use for about 7 years and romantic encounter with HCV positive partner. There was a significant period of time, in which [place] had fallen out of care. Her last in person visit was October 05, 2019. Sobriety present for two years. She is engaged and they are getting ready to purchase their new home. She has new job she truly likes.She is at good point in her life to truly proceed forward with txment and prevent falling out of care again. In July her two boys will be coming back to live with her.   ??  Interim History:  She is treatment naive. Previously she was determined to have genotype 3. Genotype repeated again today. She presents asymptomatic. Chronic anxiety symptoms of which she is going to reach out to [facility] for help.   Three barrier methods: surgical, condoms, and IUD. LMP: current.   ??  ROS: All systems reviewed and negative other than that is stated above under HPI.  ??  Past medical history:  1. Depression with anxiety   2. Status post tubal ligation  3. Possible thyroid disease in the past but she does not recall any details  4. Hypertension   5. PWID  6. HCV   ??  Liver Section:  1. Chronic hepatitis C, treatment naive, genotype 3 - prior exposure with evidence of spontaneous clearance. New infection genotype ~ 1A    2. FIBROSCAN 09/2017: 5.2 KPS c/w stage F1 fibrosis  3. Immune to hepatitis B (hepatitis B surface antigen negative, anti-HBs positive)  4. Immunity to hepatitis A: Vaccination series administered.   ??  No Known Allergies  ??  Current Medications          Current Outpatient Medications   Medication Sig Dispense Refill   ??? glecaprevir-pibrentasvir (MAVYRET) 100-40 mg tablet Take 3 tablets by mouth daily with food. (Patient taking differently: Take 3 tablets by mouth daily. Hasn't started taking yet.) 84 tablet 1   ??            Current Facility-Administered Medications   Medication Dose Route Frequency Provider Last Rate Last Admin   ??? levonorgestrel (MIRENA) 20 mcg/24 hr (5 years) IUD 1 kit  1 each Intrauterine Once Lucianne Sullivan Lone, MD          Mavyret listed but not started yet.   ??  Social history: Living with her boyfriend, Nolberto Hanlon (patient of our clinic). He is treated and cured of HCV. Two younger sons. Employed.    ??  Social History      ??        Socioeconomic History   ??? Marital status: Single   ?? ?? Spouse name: Not  on file   ??? Number of children: Not on file   ??? Years of education: Not on file   ??? Highest education level: Not on file   Occupational History   ??? Occupation: resident Geophysicist/field seismologist   Tobacco Use   ??? Smoking status: Current Every Day Smoker   ?? ?? Packs/day: 0.50   ?? ?? Types: Cigarettes   ??? Smokeless tobacco: Never Used   Substance and Sexual Activity   ??? Alcohol use: Not Currently   ?? ?? Comment: No alcohol use x 2 years.    ??? Drug use: Yes   ?? ?? Types: IV, Heroin   ?? ?? Comment: No heroin for two years.    ??? Sexual activity: Yes   ?? ?? Partners: Male   ?? ?? Birth control/protection: I.U.D., Surgical, Condom   Other Topics Concern   ??? Not on file   Social History Narrative   ??? Not on file   ??  Social Determinants of Health   ??  Financial Resource Strain: Not on file   Food Insecurity: Not on file   Transportation Needs: Not on file   Physical Activity: Not on file   Stress: Not on file   Social Connections: Not on file      ??  Family history:   Family History         Family History   Problem Relation Age of Onset   ??? Hypertension Mother ??   ??? Plantar fasciitis Mother ??   ??? Arthritis Mother ??   ??? Lung cancer Father ??   ??? Hypertension Sister ??   ??? Hypertension Brother ??   ??? Hypertension Brother ??      ??  Physical Examination:  BP 127/95  - Pulse 95  - Temp 36.6 ??C (97.9 ??F) (Temporal)  - Resp 16  - Wt 68.2 kg (150 lb 6.4 oz)  - SpO2 96%  - BMI 28.42 kg/m??   Pleasant individual in NAD. Pleasant. Appears better health.   HEENT: Sclera are anicteric, no temporal muscle loss, oropharynx is negative  NECK: No thyromegaly or lymphadenopathy, No carotid bruits  Chest: Clear to auscultation and percussion  Heart: S1, S2, RR, No murmurs  Breast: Deferred examination  Abdomen: Soft, non-tender, non-distended, no hepatosplenomegaly, no masses appreciated, no ascites  Skin: No spider angiomata, No rashes  Extremities: Without pedal edema, multiple tattoos, palmar erythema  Neuro: Grossly intact, No focal deficits  ??  Laboratory Studies:   Results for orders placed or performed in visit on 02/09/21   Pregnancy Qualitative, Urine   Result Value Ref Range    Pregnancy Test, Urine Negative Negative   Hepatitis C Genotype   Result Value Ref Range    HCV Genotype 1a    Hepatic Function Panel   Result Value Ref Range    Albumin 3.8 3.4 - 5.0 g/dL    Total Protein 7.5 5.7 - 8.2 g/dL    Total Bilirubin 0.4 0.3 - 1.2 mg/dL    Bilirubin, Direct 1.61 0.00 - 0.30 mg/dL    AST 45 (H) <=09 U/L    ALT 71 (H) 10 - 49 U/L    Alkaline Phosphatase 99 46 - 116 U/L   Basic metabolic panel   Result Value Ref Range    Sodium 137 135 - 145 mmol/L    Potassium 3.9 3.4 - 4.5 mmol/L    Chloride 112 (H) 98 - 107 mmol/L    CO2 19.1 (L) 20.0 - 31.0  mmol/L    Anion Gap 6 5 - 14 mmol/L    BUN 8 (L) 9 - 23 mg/dL    Creatinine 1.61 (L) 0.60 - 0.80 mg/dL    BUN/Creatinine Ratio 15     EGFR CKD-EPI Non-African American, Female >90 >=60 mL/min/1.48m2    EGFR CKD-EPI African American, Female >90 >=60 mL/min/1.63m2    Glucose 83 70 - 179 mg/dL    Calcium 9.7 8.7 - 09.6 mg/dL   CBC w/ Differential   Result Value Ref Range    WBC 11.4 (H) 3.5 - 10.5 10*9/L    RBC 4.53 3.90 - 5.03 10*12/L    HGB 13.7 12.0 - 15.5 g/dL    HCT 04.5 40.9 - 81.1 %    MCV 88.4 82.0 - 98.0 fL    MCH 30.3 26.0 - 34.0 pg    MCHC 34.2 30.0 - 36.0 g/dL    RDW 91.4 78.2 - 95.6 %    MPV 8.8 7.0 - 10.0 fL    Platelet 292 150 - 450 10*9/L    nRBC 0 <=4 /100 WBCs    Neutrophils % 61.2 %    Lymphocytes % 23.9 %    Monocytes % 5.1 %    Eosinophils % 9.0 %    Basophils % 0.8 %    Absolute Neutrophils 7.0 1.7 - 7.7 10*9/L    Absolute Lymphocytes 2.7 0.7 - 4.0 10*9/L    Absolute Monocytes 0.6 0.1 - 1.0 10*9/L    Absolute Eosinophils 1.0 (H) 0.0 - 0.7 10*9/L    Absolute Basophils 0.1 0.0 - 0.1 10*9/L   ??  Impression/Plan:  1. Chronic hepatitis C, treatment naive, genotype 3.  Patient likely has mild disease based upon her clinical status, laboratory data, and FIBROSCAN results. Today we readdressed the natural history of HCV infection, including the risks for progression to cirrhosis, liver failure, liver cancer, and risks of hepatocellular carcinoma. Patient is aware of the need for continued follow-up and monitoring. We discussed the importance of remaining abstinent from alcohol due to additive effects on disease progression to cirrhosis. We discussed potential treatment options that include all-oral regimens with low rates of side effects and high rates of cure (sustained virological response). We discussed that there is risk of reinfection if she returns to using injecting drug use after hepatitis C was cured.The patient will be good candidate for treatment with medications of high rate of cure with low risk of side effects. Sobriety x 2 years. Stressed grave importance of monitoring during HCV and need to adhere to follow up visits as well as recommended lab draws.     ~ HCV safety labs ordered.   ~ Tentative office follow up two months.   ~ Congratulated on maintaining sobriety.   ~ Spoke with Park Breed, our clinical pharmacist and notified of patient's visit today and requested proceeding forward with PA process.   ??  2. Immunity hepatitis A and hepatitis B: Immunity hepatitis B. Completion of hepatitis A vaccination series. Virology studies ordered to confirm immunity status.   ??  3. Tobacco use: Smoking cessation discussed.   ??  4. Transmission counseling: Reviewed with patient the importance of condom until able to confirm her cure status.   ??  6. Anxiety and depression: Follow up with [facility]. She wishes to be placed back on gabapentin. ??  ??  All patient's questions were answered to her satisfaction during clinic today.   ??  Rodman Key, DNP, FNP-BC  [facility]  Liver Program  [address]  [place] [address]  Phone [phone]  ??  ??  Addendum: Review of patient's chart, specifically HCV genotype results. It appears she was first infected with HCV genotype 3 and spontaneously cleared the virus. Genotype rechecked on October 05, 2019 during her visit in anticipation of proceeding forward with DAA treatment and genotype result was 1a. Genotyping repeated today and genotype result remains consistent with 1a.     Rodman Key, DNP, FNP-BC

## 2021-02-23 NOTE — Unmapped (Signed)
[facility] has received the prescription(s) for Epclusa. The triage team has completed the benefits investigation and has determined that the patient is NOT able to fill this medication at the [facility] due to insurance plan limitations. Please see additional information below and re-route the prescription to the preferred pharmacy. Thank you.    PA Required: No - already approved     Specialty Pharmacy Required:  [facility] - Phone: [phone] and Fax: [phone]

## 2021-02-28 DIAGNOSIS — B182 Chronic viral hepatitis C: Principal | ICD-10-CM

## 2021-02-28 MED ORDER — SOFOSBUVIR 400 MG-VELPATASVIR 100 MG TABLET
ORAL_TABLET | Freq: Every day | ORAL | 2 refills | 28 days | Status: CP
Start: 2021-02-28 — End: ?

## 2021-02-28 NOTE — Unmapped (Signed)
Initial Counseling for HCV Treatment     Planned regimen: Epclusa (sofosbuvir/velpatasvir 400/100mg ) x 12 weeks  Planned start date: TBD, pending delivery    [facility]armacy[facility] [phone] or [facility] [phone] ext [phone])    PMH:   Past Medical History:   Diagnosis Date   ??? Anxiety    ??? Brain cancer (CMS-HCC)    ??? Breast abrasion, left, sequela    ??? Breast cancer (CMS-HCC)    ??? Hepatitis C    ??? Hypertension    ??? Injury of breast, left 2014    hit breast on door      Current medications: Mirena, Ibuprofen prn for pulled back, Goody powder prn when ibuprofen not available, Tums PRN    HIPAA information was verified with patient. Verified therapy is appropriate for patient. Patient is ready to start treatment with Epclusa.     Following topics were discussed during counseling:      1. Indications for medication, dosage and administration.     A. Epclusa 400/100mg  1 tablet to take daily with or without food. Patient plans to take in the mornings.     2. Common side effects of medications and management strategies. (fatigue, headache)     A. Pregnancy precaution: on Mirena (placed 07/2017)     3. Importance of adherence to regimen, follow-up clinic visits and lab monitoring.     A.  Reviewed treatment follow up protocol including TW#4 and Post-TW#12 to assess for cure. Already have follow up scheduled 04/11/21 with Owens Shark, DNP and this may be appropriate pending start date.     4. Drug-drug interaction.    A. Current medications have been reviewed and assessed for possible interaction.     - Endorses Tums PRN:  We discussed the mechanism of drug-drug interaction with acid lowering agents.     B. Advised to check with MD or pharmacist before taking any OTC/herbal medications, with emphasis regarding indigestion/heartburn medications.  Denies use of herbal medication such as milk thistle or St. John's wart.  Allergies have been verified.      5. Importance of informing pharmacy and clinic of updated contact information. Advised her VM is full and to clear this out. [facility] as [facility]'s in process of moving. Advised to have this [facility] information handy when [facility] calls to schedule delivery.     Patient verbalized understanding of counseling. Provided contact information for any further questions/concerns.     Park Breed, Pharm D., BCPS, BCGP, CPP  [facility]  [address]  [place], [place] [address]  [phone]    February 28, 2021 11:06 AM

## 2021-02-28 NOTE — Unmapped (Signed)
Epclusa PA (Ref#[id number]) approved til 05/16/21. Pt must fill through [facility] [phone] or Vassie Loll [phone] ext [phone]) per insurance restrictions. Transferred rx over with copay card information:  YQ:[id number] GRP: [id number] BIN: [id number] PCN: [id number]

## 2021-03-20 NOTE — Unmapped (Signed)
Received call from [facility] on 3/25 that they have been trying to get in touch with the patient to arrange shipping of her Epclusa    I reached out to the patient today and made her aware--she said that she doesn't answer the phone if it is an out of state number    I made her aware that the number is [phone] and they are open M-F 730 am-730 pm CST    Patient stated that she will give them a call now to arrange shipping    I told the patient to keep track of the date that she starts the medication as that will guide her therapy    [name], Charity fundraiser

## 2021-03-21 NOTE — Unmapped (Signed)
Follow-Up Counseling for HCV Treatment      Regimen: Epclusa (sofosbuvir/velpatasvir 400/100mg ) x 12 weeks  Start Date: TBD, pending delivery    [facility] [phone] or [place] [phone] ext [phone])    Called Ms. Clegg over the phone today to confirm that she is waiting to receive her first shipment of Mavyret. She has not yet called [facility] but plans to call once our conversation was finished today. [facility] work. She was asked to call Park Breed, CPP when she has started the medication and was also educated that her current follow-up appointment with Owens Shark, NP will need to be rescheduled.    Derrel Nip, PharmD  PGY1 Pharmacy Resident    Park Breed, Pharm D., BCPS, BCGP, CPP  [facility]  [address]  [place], [place] [address]  [phone]

## 2021-04-07 NOTE — Unmapped (Incomplete)
Follow-Up Counseling for HCV Treatment      Attempted to reach patient to follow up on HCV treatment with Epclusa x12 weeks. Wanted to make sure [facility], see when her start date was, and see how she was doing on treatment. Was unable to reach patient and mailbox was full so I could not leave a voicemail.      Karle Barr, PharmD Candidate    Call completed with Vertell Limber, Nurse Care Coordinator.

## 2021-04-10 NOTE — Unmapped (Signed)
Follow-Up Counseling for HCV Treatment      Regimen: Epclusa (sofosbuvir/velpatasvir 400/100mg ) x 12 weeks  Start Date: 04/10/21    I called and spoke with pt at phone # [phone]. Pt stated due to moving she started her Epclusa today at noon after lunch. Pt thought she lost her medication but did find it. Pt advised to take Epclusa everyday at the same time and set a reminder on her phone so she does not miss any doses. Pt reminded if she needs to take Tums, not to take it within 4 hrs of taking Epclusa. Pt has not taken any in a long time and will not take any during treatment. Notified pt we will follow up again in 2 wks to see how she is doing on tx. Follow up apt rescheduled to 05/09/21 @ 10:00 am with Owens Shark, DNP.     Vertell Limber RN, [facility]   Pharmacy Department  [facility]  [address]   [place], [place] [address]  [phone]    April 10, 2021 3:52 PM

## 2021-04-25 NOTE — Unmapped (Signed)
Follow-Up Counseling for HCV Treatment      Regimen: Epclusa (sofosbuvir/velpatasvir 400/100mg ) x 12 weeks  Start Date: 04/10/21   Completed Treatment Week #2    [facility] [phone] or Vassie Loll [phone] ext [phone])    HIPAA information was verified with patient. Following topics were reviewed during the phone call:    1. Medication appropriateness and administration - Takes Epclusa daily at 2:00 pm.  Regimen is correct and unchanged.     2. Importance of adherence - Pt was not able to give me a pill count because she was not at home. .I requested for pt to call me back with her pill count when she gets home. Pt does report missing 2 doses Pt stated there is just a lot going on with moving and etc). Encouraged pt to carry her medication with her so she does not miss a dose and set her alarm on her cell phone in case she is busy and distracted.  Advised patient to call pharmacy when down to about 7-14 day supply left to ensure there's no interruption in therapy.    3. Side effects - Pt reports fatigue is the only side effect. Pt is drinking plenty of water. Encouraged pt to increase protein in her diet.     4. Changes to medications: Errica reports no changes reported at this time. Pt denies any alcohol.Pt denies taking any Tums or acid lowering medication.     5. Follow up - Has follow up appointment scheduled in HCV treatment clinic on 05/09/21 @ 10:00 am with Owens Shark, DNP.  Any barriers to coming to appointment? No.    All questions were answered.    Vertell Limber RN, [facility]   Pharmacy Department  [facility]  [address]   [place], [place] [address]  [phone]

## 2021-04-26 MED ORDER — IBUPROFEN 600 MG TABLET
Freq: Four times a day (QID) | ORAL | 0 days | PRN
Start: 2021-04-26 — End: ?

## 2021-04-26 NOTE — Unmapped (Signed)
Follow up for HCV Treatment    Regimen: Epclusa x 12 wks  Start date: 04/10/21  Completed TW#2    Received message regarding Drug-drug interaction question. Returned call to pt but no answer and VM was full so unable to leave message. Reached pt's BF, Nolberto Hanlon and left message with him to have pt return my call  to address her question. He will give her the message.    Park Breed, Pharm D., BCPS, BCGP, CPP  [facility]  [address]  [place], [place] [address]  [phone]

## 2021-05-09 ENCOUNTER — Ambulatory Visit: Admit: 2021-05-09 | Payer: MEDICAID | Attending: Family | Primary: Family

## 2021-05-10 ENCOUNTER — Encounter: Admit: 2021-05-10 | Discharge: 2021-05-10 | Disposition: A | Discharge status: Home / Self Care | Payer: MEDICAID

## 2021-05-10 DIAGNOSIS — R0789 Other chest pain: Principal | ICD-10-CM

## 2021-05-10 DIAGNOSIS — M549 Dorsalgia, unspecified: Principal | ICD-10-CM

## 2021-05-10 MED ORDER — CYCLOBENZAPRINE 10 MG TABLET
ORAL_TABLET | Freq: Two times a day (BID) | ORAL | 0 refills | 10 days | Status: CP | PRN
Start: 2021-05-10 — End: ?

## 2021-05-10 MED ADMIN — oxyCODONE (ROXICODONE) immediate release tablet 5 mg: 5 mg | ORAL | @ 17:00:00 | Stop: 2021-05-10

## 2021-05-10 MED ADMIN — acetaminophen (TYLENOL) tablet 650 mg: 650 mg | ORAL | @ 17:00:00 | Stop: 2021-05-10

## 2021-05-10 MED ADMIN — diazePAM (VALIUM) tablet 2 mg: 2 mg | ORAL | @ 17:00:00 | Stop: 2021-05-10

## 2021-05-10 MED ADMIN — ketorolac (TORADOL) injection 30 mg: 30 mg | INTRAMUSCULAR | @ 17:00:00 | Stop: 2021-05-10

## 2021-05-10 NOTE — Unmapped (Signed)
[facility] Emergency Department Provider Note      ED Course, Assessment and Plan     Initial Clinical Impression:    May 10, 2021 12:06 PM     Lori Bowers is a 35 y.o. 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  Pain described as muscle cramping, worse with movement.  She also states due to her pain is difficult for her to take a deep breath in.  Denies chest pressure, nausea, vomiting fevers or chills.          BP 121/89  - Pulse 101  - Temp 37 ??C (98.6 ??F) (Oral)  - Resp 16  - SpO2 100%       Physical exam: Hemodynamically stable but mildly tachycardic to 105 on my exam with no murmurs rubs or gallops, afebrile normal work of breathing, equal breath sounds bilaterally.  She does have reproducible pain with palpation over her left anterior chest wall, left axillary region, left posterior shoulder, and left posterior cervical paravertebral musculature.  No neurological deficit, PERRL, cranial nerves intact, no carotid bruit.      MDM (differential diagnosis, diagnostic testing, treatment, and disposition):    Patient presenting with worsening left arm pain left axillary pain, left posterior shoulder pain neck pain after working heavily in her yard, and the following day lifting a patient and straining herself.  She had immediate worsening of her pain after trying to lift a patient.  My suspicion is that she suffered a significant muscle strain.  Considered additionally on differential includes ACS, PE, dissection, however these seem quite unlikely given the patient is otherwise healthy and had a clear inciting injury, and reproducible pain on her exam.  No history of cancer, not on oral contraceptives, no hemoptysis.  Infection considered, seems unlikely given no fevers chills cough congestion.  She also has equal pulses with no signs of dissection on her exam, no hypotension or significant hypertension.    We will plan to obtain chest x-ray to rule out pneumothorax given progressive worsening shortness of breath.  Also obtain plain films of her shoulder given she has pain with range of motion.  EKG obtained which looks reassuring.  Will treat with Tylenol, Toradol, oxycodone and Valium.  If her pain is improved we will plan to discharge home without any further work-up      ED Course:  ED Course as of 05/10/21 1645   Wed May 10, 2021   1208 EKG showing sinus tachycardia with a rate of 104, normal axis, normal intervals,, sub 1mm ST elevations in 2 3 aVF   1209 No reciprocal changes   1243 Reinterviewed patient.  No history of Ehlers-Danlos or Marfan's..  Discussed plan to obtain images, pain control for goal labs, which she is not agreeable to.   1407 Vital signs improved s/p pain management - no PTX or rib fractures     Reassessed, pain improved, will discharge home with Flexeril, advised take Tylenol Motrin and given strict return precautions.  _____________________________________________________________________    The case was discussed with attending physician who is in agreement with the above assessment and plan    Additional Medical Decision Making     I have  reviewed the vital signs and the nursing notes. Diagnostic studies including labs, EKG, and radiology results that were available during my care of the patient were independently reviewed by me and considered in my medical decision making. I independently visualized the radiology images     I reviewed the patient's prior medical records if available.  Additional history obtained from family if available    History     CHIEF COMPLAINT:   Chief Complaint   Patient presents with   ??? Back Pain       HPI: Lori Bowers is a 35 y.o. 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 Patient reports 4 days of worsening mid-sternal chest pain with associated difficulty breathing 2/2 pain radiating to her left shoulder and left back. She states the pain is aggravated with deep inspiration, laying flat, and movement of her left upper extremity. 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 She has a prior hx of similar symptoms a year ago when she had as pulled muscle. She has taken ibuprofen for pain. She recently finished a course of antibiotics last week for a tooth infection. No recent surgeries. Patient has an IUD. Denies hx of blood clots, cancer, or coughing up blood. Otherwise, patient currently denies numbness, tingling, fever, vomiting, weakness, vision changes, abdominal pain, leg swelling, or any other medical concerns.         PAST MEDICAL HISTORY/PAST SURGICAL HISTORY:   Past Medical History:   Diagnosis Date   ??? Anxiety    ??? Brain cancer (CMS-HCC)    ??? Breast abrasion, left, sequela    ??? Breast cancer (CMS-HCC)    ??? Hepatitis C    ??? Hypertension    ??? Injury of breast, left 2014    hit breast on door        Past Surgical History:   Procedure Laterality Date   ??? BREAST LUMPECTOMY     ??? PR DRAIN SKIN ABSCESS SIMPLE Left 11/03/2017    Procedure: INCISION & DRAINAGE OF ABSCESS; SIMPLE OR SINGLE;  Surgeon: Colon Branch, MD;  [facility];  Service: General Surgery   ??? TONSILLECTOMY AND ADENOIDECTOMY     ??? TUBAL LIGATION         MEDICATIONS:     Current Facility-Administered Medications:   ???  levonorgestrel (MIRENA) 20 mcg/24 hr (5 years) IUD 1 kit, 1 each, Intrauterine, Once, Lucianne Sullivan Lone, MD    Current Outpatient Medications:   ???  cyclobenzaprine (FLEXERIL) 10 MG tablet, Take 1 tablet (10 mg total) by mouth two (2) times a day as needed for muscle spasms., Disp: 20 tablet, Rfl: 0  ???  sofosbuvir-velpatasvir (EPCLUSA) 400-100 mg tablet, Take 1 tablet by mouth daily., Disp: 28 tablet, Rfl: 2    ALLERGIES:   Patient has no known allergies.    SOCIAL HISTORY:   Social History     Tobacco Use   ??? Smoking status: Current Every Day Smoker     Packs/day: 0.50     Types: Cigarettes   ??? Smokeless tobacco: Never Used   Substance Use Topics   ??? Alcohol use: Not Currently     Comment: No alcohol use x 2 years.        FAMILY HISTORY:  Family History   Problem Relation Age  of Onset   ??? Hypertension Mother    ??? Plantar fasciitis Mother    ??? Arthritis Mother    ??? Lung cancer Father    ??? Hypertension Sister    ??? Hypertension Brother    ??? Hypertension Brother           Review of Systems    A 10 point review of systems was performed and is negative other than positive elements noted in HPI     Constitutional: Negative for fever, chills  Eyes: Negative for visual changes.  ENT: Negative for sore throat.  Cardiovascular: Positive for chest pain. Negative for palpitations.  Respiratory: Positive for shortness of breath. Negative for cough or wheezing.  Gastrointestinal: Negative for abdominal pain, vomiting or diarrhea.  Genitourinary: Negative for dysuria.  Musculoskeletal: Positive for back pain.  Skin: Negative for rash.  Neurological: Negative for headaches, focal weakness or numbness.    Physical Exam     VITAL SIGNS:    BP 121/89  - Pulse 101  - Temp 37 ??C (98.6 ??F) (Oral)  - Resp 16  - SpO2 100%     Constitutional: Alert and oriented. Well appearing and in no distress. No increased respiratory effort.   Eyes: Conjunctivae are normal. PERRL  ENT       Head: Normocephalic and atraumatic.       Nose: No congestion.       Mouth/Throat: Mucous membranes are moist.       Neck: No stridor. No carotid bruit.  Hematological/Lymphatic/Immunilogical: No gross lymphadenopathy  Cardiovascular: Tachycardic with regular rhythm. No murmurs, rubs or gallops.  Symmetric distal pulses are present in all extremities.Warm and well perfused.   Respiratory: Normal respiratory effort. Breath sounds are normal.  Gastrointestinal: Soft and nontender. There is no CVA tenderness.  Musculoskeletal: Reproducible pain with palpation over her left anterior chest wall, left axillary region, left posterior shoulder, and left posterior cervical paravertebral musculature. Grossly normal range of motion in all extremities. No pitting lower extremity edema  Neurologic: Normal speech and language. No gross focal neurologic deficits are appreciated. No gross asymmetrical weakness. Cranial nerves intact  Skin: Skin is warm, dry and intact. No rash noted.  Psychiatric: Mood and affect are normal. Speech and behavior are normal.    Radiology     XR Shoulder 3 Or More Views Left   Final Result   Normal radiographs of the left shoulder.      XR Chest 2 views   Final Result      Clear lungs.           Labs     Labs Reviewed - No data to display      Pertinent labs & imaging results that were available during my care of the patient were reviewed by me and considered in my medical decision making (see chart for details).    Please note- This chart has been created using [employer]. Chart creation errors have been sought, but may not always be located and such creation errors, especially pronoun confusion, do NOT reflect on the standard of medical care.    Documentation assistance was provided by Evorn Gong, Scribe, on May 10, 2021 at 12:38 PM for Leta Speller, MD.    Documentation assistance was provided by the scribe in my presence.  The documentation recorded by the scribe has been reviewed by me and accurately reflects the services I personally performed.        Leta Speller, MD  Leta Speller, MD  Resident  05/10/21 [phone]

## 2021-05-10 NOTE — Unmapped (Signed)
Pt states she pulled somethingthis weekend while working in yard and re-inijured it yesterday picking someone up at work. Pt CO pain in sternum around L side into L back and neck.

## 2021-05-11 NOTE — Unmapped (Signed)
Follow-Up Counseling for HCV Treatment . Pt missed her TW # 4 apt with Owens Shark on 05/09/21    Regimen: Epclusa (sofosbuvir/velpatasvir 400/100mg ) x 12 weeks  Start Date: 04/10/21  Completed Treatment Week #4    [facility] [phone]??or Vassie Loll [phone] ext [phone])    Pt missed her TW # 4 apt with Owens Shark, DNP. Multiple attempts have been made to try and reach pt to coordinate new apt. I was unable to leave pt a VM because mailbox has not been set up yet.       Vertell Limber RN, [facility]   Pharmacy Department  [facility]  [address]   [place], [place] [address]  [phone]

## 2021-05-26 NOTE — Unmapped (Signed)
-----   Message from Herschell Dimes sent at 05/26/2021 12:04 PM EDT -----  Regarding: RE: Needs f/u appt  Good Afternoon,    Called pt, mail box is full and recall has been placed.    Thank you  Corina   ----- Message -----  From: Thomasene Lot, CPP  Sent: 05/26/2021  11:50 AM EDT  To: Reita Chard Medicine [facility] Appts  Subject: Needs f/u appt                                   Hi,    This pt was no show on 05/09/21 with Owens Shark. Please call pt and reschedule at her earliest convenience.    Thanks,  Erskine Squibb

## 2021-05-31 NOTE — Unmapped (Signed)
Follow-Up Counseling for HCV Treatment . Pt missed her TW # 4 apt with Owens Shark on 05/09/21  ??  Regimen: Epclusa (sofosbuvir/velpatasvir 400/100mg ) x 12 weeks  Start Date: 04/10/21  Completed Treatment Week #7  ??  [facility] [phone]??or Vassie Loll [phone] ext [phone])  ??  Pt missed her TW # 4 apt with Owens Shark, DNP. I made another call attempt to reach pt but still not able to reach pt or leave a VM because her mailbox is full.     Vertell Limber RN, [facility]   Pharmacy Department  [facility]  [address]   [place], [place] [address]  [phone]

## 2021-06-01 NOTE — Unmapped (Signed)
Follow-Up Counseling for HCV Treatment??. Pt missed her TW # 4 apt with Owens Shark on 05/09/21  ??  Regimen: Epclusa (sofosbuvir/velpatasvir 400/100mg ) x??12??weeks  Start Date:??04/10/21  Completed Treatment Week #7  ??  [facility] [phone]??or Vassie Loll [phone] ext [phone])  ??  Pt missed her TW # 4 apt with Owens Shark, DNP. I made another call attempt to reach pt but still not able to reach pt or leave a VM because her mailbox is full. I called pt's boyfriend Nolberto Hanlon at phone # [phone] and requested to please have Emilea give me a call. Greig Castilla said he has been telling her to call me. He will pass along the message. Pt has my call back number.     Vertell Limber RN, [facility]   Pharmacy Department  [facility]  [address]   [place], [place] [address]  [phone]

## 2021-06-07 NOTE — Unmapped (Signed)
Follow-Up Counseling for HCV Treatment??. Pt missed her TW # 4 apt with Owens Shark on 05/09/21  ??  Regimen: Epclusa (sofosbuvir/velpatasvir 400/100mg ) x??12??weeks  Start Date:??04/10/21  Completed Treatment Week #8    [facility] [phone]??or Vassie Loll [phone] ext [phone])    I called pt to coordinate new apt for follow up since starting HCV tx with Epclusa on 04/10/21. Pt missed her TW # 4 apt on 05/09/21. I called but unable to leave a VM because mailbox is full.       Vertell Limber RN, [facility]   Pharmacy Department  [facility]  [address]   [place], [place] [address]  [phone]

## 2021-06-09 NOTE — Unmapped (Signed)
Follow-Up Counseling for HCV Treatment??. Pt missed her TW # 4 apt with Owens Shark on 05/09/21  ??  Regimen: Epclusa (sofosbuvir/velpatasvir 400/100mg ) x??12??weeks  Start Date:??04/10/21  Completed Treatment Week #8  ??  [facility] [phone]??or Vassie Loll [phone] ext [phone])  ??  I made another attempt to reach pt  to coordinate new apt. Pt was a no show to TW # 4 apt and we have not been able to reach pt to see how she is doing on HCV treatment. I was not able to reach pt and not able to leave a VM.     Vertell Limber RN, [facility]   Pharmacy Department  [facility] [place] [facility]  [address]   [place], [place] [address]  [phone]

## 2021-06-14 NOTE — Unmapped (Addendum)
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  [facility] [phone]??or Vassie Loll [phone] ext [phone])  ??  I made another call attempt to coordinate EOT apt. I was not able to leave a VM at phone # [phone] because the mailbox was full. I contacted the [facility] representative Vassie Loll and Dorita Fray was delivered to her on 03/23/21, and 04/21/21, but they were unable to reach pt for last refill. They have left multiple messages for pt but she never returned call to set up her last refill. Erskine Squibb, CPP and Owens Shark, DNP were both notified.  I will continue to reach out to pt to have her follow up in the office to assess for cure or discuss re treatment options.     Vertell Limber RN, [facility]   Pharmacy Department  [facility]  [address]   [place], [place] [address]  [phone]

## 2021-06-16 NOTE — Unmapped (Signed)
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 Pt did not show for her TW # 4 apt and we have not been able to get in touch with pt after multiple call attempts. [facility] and they were not able to reach pt to send out there last 4 weeks of Epclusa. Owens Shark, DNP and Erskine Squibb, CPP were notified. I tried to reach out to pt again this morning but unable to leave a VM.       Vertell Limber RN, [facility]   Pharmacy Department  [facility]  [address]   [place], [place] [address]  [phone]

## 2021-06-19 NOTE — Unmapped (Signed)
Reason for call: Coordinate EOT apt  ??  Hep C  Genotype:??1a (10/05/19)  Treatment:??Epclusa (sofosbuvir/velpatasvir 400/100mg ) x??12??weeks  Start Date:??04/10/21  Completed Treatment Week #8 (did not [facility]ill [facility])  Fibrosis:??F0 (5.2 kPa) on Fibroscan on 10/08/17  ??  I made another attempt to reach pt at phone # [phone] but mailbox is still full and I was not able to leave pt a VM. Pt missed her TW # 4 apt and [facility] could not reach pt to coordinate last refill. Provider is aware that pt has become lost to follow.       Vertell Limber RN, [facility]   Pharmacy Department  [facility]  [address]   [place], [place] [address]  [phone]

## 2021-06-22 NOTE — Unmapped (Signed)
Reason for call: Coordinate EOT apt  ??  Hep C  Genotype:??1a (10/05/19)  Treatment:??Epclusa (sofosbuvir/velpatasvir 400/100mg ) x??12??weeks  Start Date:??04/10/21  Completed Treatment Week #8 (did not [facility]ill [facility])  Fibrosis:??F0 (5.2 kPa) on Fibroscan on 10/08/17  ??  I made another attempt to reach pt at phone # [phone] but mailbox is still full and I was not able to leave pt a VM. Pt missed her TW # 4 apt and [facility] could not reach pt to coordinate last refill. Provider is aware that pt has become lost to follow.       Vertell Limber RN, [facility]   Pharmacy Department  [facility]  [address]   [place], [place] [address]  [phone]

## 2021-06-27 NOTE — Unmapped (Signed)
Hep C  Genotype:??1a (10/05/19)  Treatment:??Epclusa (sofosbuvir/velpatasvir 400/100mg ) x??12??weeks  Start Date:??04/10/21  Completed Treatment Week #8 (did not receive [facility]ill [facility])  Fibrosis:??F0 (5.2 kPa) on Fibroscan on 10/08/17  ??  I made another attempt to reach pt to coordinate new apt. Pt has become Lost to follow during treatment. [facility] not [facility] pt to send out last 4 wks of treatment. I was not able to leave a VM because mailbox is full. I tried reaching her boyfriend Greig Castilla but his mailbox has not been set up on his phone to leave a VM.     Vertell Limber RN, [facility]   Pharmacy Department  [facility]  [address]   [place], [place] [address]  [phone]

## 2021-07-05 NOTE — Unmapped (Signed)
Reason for call: Coordinate new apt for reassessment    Hep C  Genotype:1a (10/05/19)  S/p Epclusa x 12 wks (04/10/21 anticipated tx thru wk 8) Did not [facility]  Fibrosis:??F0 (5.2 kPa) on Fibroscan on 10/08/17    Pt became lost to follow while being treated for Hepatitis C. We have tried multiple times to reach pt for re evaluation of treatment. I made another attempt to reach pt at phone # [phone].but the mailbox continues to be full and I was not able to leave a VM.       Vertell Limber RN, [facility]   Pharmacy Department  [facility]  [address]   [place], [place] [address]  [phone]

## 2021-07-15 ENCOUNTER — Ambulatory Visit
Admit: 2021-07-15 | Discharge: 2021-07-16 | Disposition: A | Discharge status: Home / Self Care | Payer: MEDICAID | Attending: Emergency Medicine

## 2021-07-15 ENCOUNTER — Ambulatory Visit: Admit: 2021-07-15 | Discharge: 2021-07-15 | Discharge status: Against Medical Advice | Payer: MEDICAID

## 2021-07-15 NOTE — Unmapped (Signed)
Mid back pain, worse with movement. Pt states has been working outside in the garden. Having increased pain last night and difficulty sleeping.

## 2021-07-15 NOTE — Unmapped (Signed)
Pt stated to this writer that she could no longer wait. Writer encouraged pt to stay. Pt insisted that she was leaving. Writer witnessed pt walking out front exit door

## 2021-07-16 MED ORDER — DIAZEPAM 5 MG TABLET
ORAL_TABLET | Freq: Two times a day (BID) | ORAL | 0 refills | 5 days | Status: CP
Start: 2021-07-16 — End: 2021-07-21

## 2021-07-16 MED ORDER — MELOXICAM 7.5 MG TABLET
ORAL_TABLET | Freq: Every day | ORAL | 0 refills | 7 days | Status: CP
Start: 2021-07-16 — End: 2021-07-23

## 2021-07-16 MED ADMIN — lidocaine (LIDODERM) 5 % patch 1 patch: 1 | TRANSDERMAL | @ 05:00:00 | Stop: 2021-07-16

## 2021-07-16 MED ADMIN — ketorolac (TORADOL) injection 60 mg: 60 mg | INTRAMUSCULAR | @ 05:00:00 | Stop: 2021-07-16

## 2021-07-16 MED ADMIN — oxyCODONE (ROXICODONE) immediate release tablet 5 mg: 5 mg | ORAL | @ 05:00:00 | Stop: 2021-07-16

## 2021-07-16 NOTE — Unmapped (Signed)
Patient presenting with lower back pain, numbness/tingling in BUE and BLE. Denies IVDU. No saddle anesthesia. No urinary/bowel retention/incontinence. Ambulatory to triage.

## 2021-07-16 NOTE — Unmapped (Shared)
[facility] Emergency Department Provider Note    Patient Identification  Lori Bowers  Patient information was obtained from patient.  History/Exam limitations: none.    ED Clinical Impression     Final diagnoses:   Lumbar strain, initial encounter (Primary)     Initial Impression, ED Course, Assessment and Plan     Impression: Lori Bowers is a 35 y.o. 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     Triage VS notable for hypertensive to 161/100 and tachycardia to 130. Patient appears uncomfortable. Exam is notable for paraspinal, neck and lumbar (R>L) TTP. No midline TTP. Sensation intact. 5/5 strength in extremities.     Differential includes musculoskeletal back pain, herniated disc, radiculopathy, with no concern right now for central cord etiology, no red flags in history.    Will treat patient with oxycodone, toradol, and lidoderm patch for pain control.     Pain much better controlled.  We will send home with muscle relaxers, NSAIDs and spinal clinic follow-up.    Additional Medical Decision Making     I independently visualized the EKG tracing.   I independently visualized the radiology images.   I reviewed the patient's prior medical records.     Any labs and radiology results that were available during my care of the patient were independently reviewed by me and considered in my medical decision making.    Portions of this record have been created using Scientist, clinical (histocompatibility and immunogenetics). Dictation errors have been sought, but may not have been identified and corrected.  ____________________________________________    I have reviewed the triage vital signs and the nursing notes.      Patient History     Chief Complaint  Back Pain      HPI   Lori Bowers is a 35 y.o. female with history of breast cancer s/p breast lumpectomy, brain cancer, HTN, hepatitis C, and anxiety who presents with back pain. 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 Of note, she continued to work lifting patients after onset of pain. She reports new onset of bilateral hand and left toe paresthesias, chest pain over mid-sternal area, and difficulty with deep inspirations, prompting visit to ED. She has taken ibuprofen without relief. She has hx of IVDU, although quit 3 years ago. NKDA.   No weakness, vaginal numbness, or urinary/fecal incontinence.     Past Medical History:   Diagnosis Date   ??? Anxiety    ??? Brain cancer (CMS-HCC)    ??? Breast abrasion, left, sequela    ??? Breast cancer (CMS-HCC)    ??? Hepatitis C    ??? Hypertension    ??? Injury of breast, left 2014    hit breast on door        Patient Active Problem List   Diagnosis   ??? Chronic hepatitis C without hepatic coma (CMS-HCC)       Past Surgical History:   Procedure Laterality Date   ??? BREAST LUMPECTOMY     ??? PR DRAIN SKIN ABSCESS SIMPLE Left 11/03/2017    Procedure: INCISION & DRAINAGE OF ABSCESS; SIMPLE OR SINGLE;  Surgeon:  Colon Branch, MD;  [facility];  Service: General Surgery   ??? TONSILLECTOMY AND ADENOIDECTOMY     ??? TUBAL LIGATION           Current Facility-Administered Medications:   ???  levonorgestrel (MIRENA) 20 mcg/24 hr (5 years) IUD 1 kit, 1 each, Intrauterine, Once, Lucianne Sullivan Lone, MD    Current Outpatient Medications:   ???  cyclobenzaprine (FLEXERIL) 10 MG tablet, Take 1 tablet (10 mg total) by mouth two (2) times a day as needed for muscle spasms., Disp: 20 tablet, Rfl: 0  ???  diazePAM (VALIUM) 5 MG tablet, Take 1 tablet (5 mg total) by mouth Two (2) times a day for 5 days., Disp: 10 tablet, Rfl: 0  ???  meloxicam (MOBIC) 7.5 MG tablet, Take 1 tablet (7.5 mg total) by mouth in the morning for 7 days., Disp: 7 tablet, Rfl: 0  ???  sofosbuvir-velpatasvir (EPCLUSA) 400-100 mg tablet, Take 1 tablet by mouth daily., Disp: 28 tablet, Rfl: 2    Allergies  None    Family History   Problem Relation Age of Onset   ??? Hypertension Mother    ??? Plantar fasciitis Mother    ??? Arthritis Mother    ??? Lung cancer Father    ??? Hypertension Sister    ??? Hypertension Brother    ??? Hypertension Brother      Social History  Social History     Tobacco Use   ??? Smoking status: Current Every Day Smoker     Packs/day: 0.50     Types: Cigarettes   ??? Smokeless tobacco: Never Used   Substance Use Topics   ??? Alcohol use: Not Currently     Comment: No alcohol use x 2 years.    ??? Drug use: Yes     Types: IV, Heroin     Comment: No heroin for two years.        Review of Systems  General: no fevers, chills  Cardiac: positive for CP, no SOB or palpitations  Pulmonary: no cough, sputum production, hemoptysis  A 10 point review of systems was negative except for those previously mentioned in the HPI and above.    Physical Exam     ED Triage Vitals   Enc Vitals Group      BP 07/15/21 1930 161/100      Heart Rate 07/15/21 1930 (S) 130      SpO2 Pulse 07/15/21 2250 125      Resp 07/15/21 1930 19      Temp 07/15/21 1930 37.2 ??C (98.9 ??F)      Temp Source 07/15/21 1930 Oral      SpO2 07/15/21 1930 100 %     Constitutional: Uncomfortable appearing.   ENT:       Head: [place]/AT       Eyes: PERRL, conjunctiva clear, sclera anicteric       Nose: no congestion       Mouth/Throat: MM moist       Neck: supple, midline trachea, no tenderness or cervical adenopathy  Cardiac: RRR, normal S1, S2, no appreciable murmurs or rubs. Distal pulses present and symmetrical B/L  Respiratory: CTA bilaterally, non-labored breathing, no stridor, wheezing or crackles  Abdomen: soft, NT, normal BS. No masses, rebound or guarding  GU: deferred  Extremities: LE normal with no CCE, symmetric in size and NTTP  Musculoskeletal: Paraspinal, neck and lumbar (R>L) TTP. No midline TTP.  Skin: warm and dry, no rashes  or lesions  Neurologic: no gross focal neurologic deficits appreciated. Sensation intact. 5/5 strength in extremities.   Psychiatric: normal mood, affect, speech and behavior      Radiology     No orders to display            Documentation assistance was provided by Fabian Sharp on July 16, 2021 at 12:02 AM for Irven Baltimore, MD.    Documentation assistance was provided by the scribe in my presence.  The documentation recorded by the scribe has been reviewed by me and accurately reflects the services I personally performed.       Harriet Pho, MD  07/17/21 [phone]

## 2021-07-16 NOTE — Unmapped (Signed)
Pt crying, tearful, with shallow respirations

## 2021-07-20 DIAGNOSIS — B182 Chronic viral hepatitis C: Principal | ICD-10-CM

## 2021-07-20 NOTE — Unmapped (Signed)
Reason for call: Coordinate new apt   ??  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 I spoke with [facility] and they were unable to reach pt to ship out last 4 wks of treatment. I have tried multiple times to reach pt to schedule a new apt with Hepatology but her mailbox is full and I have not been able to leave a VM. I tried again today but was not able to reach pt.     Vertell Limber RN, [facility]   Pharmacy Department  [facility]  [address]   [place], [place] [address]  [phone]

## 2021-07-21 ENCOUNTER — Ambulatory Visit: Admit: 2021-07-21 | Discharge: 2021-07-22

## 2021-07-21 MED ORDER — MELOXICAM 7.5 MG TABLET
ORAL_TABLET | Freq: Every day | ORAL | 0 refills | 7 days | Status: CP
Start: 2021-07-21 — End: 2021-07-28

## 2021-07-21 MED ORDER — METHYLPREDNISOLONE 4 MG TABLETS IN A DOSE PACK
0 refills | 0 days | Status: CP
Start: 2021-07-21 — End: ?

## 2021-07-21 MED ORDER — METHOCARBAMOL 500 MG TABLET
ORAL_TABLET | Freq: Four times a day (QID) | ORAL | 0 refills | 7 days | Status: CP | PRN
Start: 2021-07-21 — End: 2021-07-28

## 2021-07-21 NOTE — Unmapped (Signed)
Physical Medicine & Rehabilitation       Spine Center - Patient Evaluation       Patient Name: [name]  MRN: [id number]  DOB: 04-18-1986  Referring Provider: Harriet Bowers  Date of Encounter: 07/21/2021    IMPRESSION:      Lori Bowers is a 35 y.o. female with acute on chronic pain inbetween the shoulder blades that extends to the lumbar spine secondary to mechanical injury. Suspect pain to be primarily myofascial in nature. Exam reveals intact strength and sensation. Thoracic and Lumbar xrays obtained which revealed mild degenerative changes, no acute findings. Recommend patient start formal physical therapy. Due to severity of symptoms, medrol dose pack and short supply of Robaxin provided.     ASSESSMENT & PLAN:     PLAN:  Patient Instructions     Your diagnosis:   1. Lumbar strain, initial encounter        Recommendations/Plan  ?? Activity: Activities as tolerated, Recommend progressive/daily walking., To prevent falls, remove small loose rugs, keep hallway lights on, and install grab bars., and Monitor your symptoms. Be mindful of the warning symptoms we discussed.  ?? Conservative Care: Encourage daily walking  ?? Recommend: Tylenol up to 1000mg   PO TID PRN for pain. Do not exceed daily dose of 3000mg ., OTC topical menthol analgesic (ie. Biofreeze) according to package instructions, OTC diclofenac or Voltaren gel BID PRN for pain according to package instructions, and OTC lidocaine patches (ie Solanpas) according to package instructions. Counseled regarding common side effects and appropriate administration of medications.    ?? Medications prescribed today: Meloxicam (Mobic) 7.5mg  PO BID PRN for pain. Do not take other NSAIDs simultaneously with this medication, Medrol DosePak: Please follow package directions. , and Methocarbomol (Robaxin): Take one pill up to four times a day as needed for pain relief/muscle spasms. Counseled regarding common side effects and appropriate administration of medications.   ?? Please follow up with Lori Bowers, hepatic nurse coordinator at: [phone]. Lori Bowers ordered HCV RNA quantitative pcr, and also hepatic function panel which needs to be completed at [facility].     Seek emergency care for red flag signs such as new or progressive motor weakness, sensory deficits, saddle anesthesia, bowel/bladder dysfunction, gait/coordination disturbance, weight loss, and night pain that should prompt evaluation at nearest ER.           Orders Placed This Encounter   Procedures   ??? XR Lumbar Spine AP Lateral And Obliques   ??? XR Thoracic Spine 2 Views   ??? Ambulatory referral to Physical Therapy     Medications Prescribed Today             methocarbamoL (ROBAXIN) 500 MG tablet Take 1 tablet (500 mg total) by mouth four (4) times a day as needed for up to 7 days.    meloxicam (MOBIC) 7.5 MG tablet Take 2 tablets (15 mg total) by mouth in the morning for 7 days.    methylPREDNISolone (MEDROL DOSEPACK) 4 mg tablet follow package directions          FOLLOW UP:   ??? Return if symptoms worsen or fail to improve.Marland Kitchen   ??? Return sooner if needed.   ??? Advised to send a message via MyChart or call the clinic with any questions or concerns in the interim.    FUTURE CONSIDERATIONS:   ??? MRIs    Comprehensive patient education performed today explaining that the care plan will integrate multimodal activity-based rehabilitation techniques to enhance recovery,  reduce pain, and facilitate functionality. Risks, benefits, and instructions for all medications prescribed / treatments offered reviewed with patient, who expresses understanding.  Patient strongly advised regarding red flag signs such as new or progressive motor weakness, sensory deficits, saddle anesthesia, bowel/bladder dysfunction, gait/coordination disturbance, weight loss, and night pain that should prompt evaluation at nearest ER.        SUBJECTIVE     Reason for Visit  Neck Pain (Upper back. Hurts to sit down. Trouble sleeping.bilateral fingertips numb and tingling at times)      History of Present Illness    Lori Bowers is a 35 y.o. female with a relevant PMH of breast cancer, brain cncer, HTN, hep C, anxiety seen in consultation at the request of Lori Pho, MD for evaluation of Neck Pain (Upper back. Hurts to sit down. Trouble sleeping.bilateral fingertips numb and tingling at times)    Patient reports she was lifting a patient and felt a popping sensation. Reports pain is primarily in between her shoulder blades and extends down wards. Pain will also start from the lumbar region and extend upwards. Denies no radiating pain into legs. Patient was recently seen at the ED 07/16/21 for a lumbar strain and was given Mobic and Valium.     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 Patient is a recovery addict, and has been clean for 3 years.          07/21/21 1337   PainSc:   7     Date of onset: 07/13/21  Symptom Location: pain in between shoulder blades extending down to the lumbar spine  Additional Symptoms: denies numbness, tingling or shooting pain down the lower extremities  Symptom Character: sharp, stiffness, aching and soreness  Symptom Onset/Mechanism: acute (< 1 month)  Temporal Pattern: constant  Aggravating Factors: sitting, standing, walking, lying down, bending  Alleviating Factors: none  Falls: none      Associated Signs/Symptoms:  Unintended Weight Loss: no  Fever/Infection:no  Loss of bowel or bladder control: no  Saddle anesthesia: no  Neuromotor Function: motor weakness - (no)  Gait/coordination disturbance - (no)      Current Treatments Previous Treatments   Current Relevant Pain Medications:  ??? NSAID: Mobic 7.5mg      Current Physical Therapy: No recent PT    Adjunct Treatments: Activity Modification and Heat/Ice    In a Pain Clinic: No Prior Relevant Pain Medications:  ??? Toradol     Prior Physical Therapy: None.    Injections:None.    Prior Relevant Surgeries: None.       Current Medications:   Current Outpatient Medications   Medication Sig Dispense Refill   ??? ibuprofen (MOTRIN) 600 MG tablet Take 600 mg by mouth every six (6) hours as needed.     ??? meloxicam (MOBIC) 7.5 MG tablet Take 2 tablets (15 mg total) by mouth in the morning for 7 days. 14 tablet 0   ??? sofosbuvir-velpatasvir (EPCLUSA) 400-100 mg tablet Take 1 tablet by mouth daily. 28 tablet 2   ??? methocarbamoL (ROBAXIN) 500 MG tablet Take 1 tablet (500 mg total) by mouth four (4) times a day as needed for up to 7 days. 28 tablet 0   ??? methylPREDNISolone (MEDROL DOSEPACK) 4 mg tablet follow package directions 1 each 0     Current Facility-Administered Medications   Medication Dose Route Frequency Provider Last Rate Last Admin   ??? levonorgestrel (MIRENA) 20 mcg/24 hr (5  years) IUD 1 kit  1 each Intrauterine Once Lucianne Sullivan Lone, MD           Allergies:   Patient has no known allergies.    PMH:   Past Medical History:   Diagnosis Date   ??? Anxiety    ??? Brain cancer (CMS-HCC)    ??? Breast abrasion, left, sequela    ??? Breast cancer (CMS-HCC)    ??? Hepatitis C    ??? Hypertension    ??? Injury of breast, left 2014    hit breast on door        PSH:  Past Surgical History:   Procedure Laterality Date   ??? BREAST LUMPECTOMY     ??? PR DRAIN SKIN ABSCESS SIMPLE Left 11/03/2017    Procedure: INCISION & DRAINAGE OF ABSCESS; SIMPLE OR SINGLE;  Surgeon: Colon Branch, MD;  [facility]: [facility];  Service: General Surgery   ??? TONSILLECTOMY AND ADENOIDECTOMY     ??? TUBAL LIGATION         Social History:   She  reports that she has been smoking cigarettes. She has been smoking about 0.50 packs per day. She has never used smokeless tobacco. She reports previous alcohol use. She reports current drug use. Drugs: IV and Heroin.        Occupational History   ??? Occupation: resident Geophysicist/field seismologist       Pertinent Family History: No relevant family history reported unless stated above in HPI.     Review of Systems: Pertinent positives are noted in HPI.  Patient has been instructed to followup with PCP or appropriate specialist for symptoms outside the purview of this speciality.    OBJECTIVE     Temp 36.8 ??C (98.2 ??F)  - Ht 157 cm (5' 1.81)  - Wt 67.2 kg (148 lb 3.2 oz)  - BMI 27.27 kg/m??     Physical Exam    General:  Well nourished and well developed, in no acute distress.  Extremities:  No edema present in bilateral upper and lower extremities.  Skin:   No rash, ecchymosis, or other discoloration present.     Neurologic:      - Cerebellum/Coordination: Deferred    - Gait: normal.  Able to perform Toe and Heel Gait. pain to back with toe gait    - Straight Line Tandem Gait: normal coordinated tandem gait    - Sensation: Grossly intact to light touch in bilateral upper and lower extremities.      Motor R L  Reflexes R L   Deltoid 5 5  Tricep 1+ 1+   Bicep 5 5  Bicep 1-2+ 1-2+   Tricep 5 5  Brachiorad 1-2+ 1-2+   WE 5 5       Grip 5 5  Patellar 1-2+ 1-2+   IO 5 5  Achilles 1+ 1+   IP 5 5       Quad 5 5  Pathologic R L   TA 5 5  Hoffmann's neg. neg.   EHL 5 5  Babinski neg. neg.   GS 5 5  Clonus neg. neg.        Musculoskeletal:  Neck/Cervical Spine Exam  Inspection: No swelling, erythema, deformity, atrophy or hypertrophy noted  Cervical Spine ROM: normal  Spine Palpation: normal skin, diffusely tender to palpation  Special tests: Spurling's negative and Lhermitte's negative      Right Shoulder: Negative impingement. No atrophy noted upon inspection. Normal elevation ROM and  stability. Normal skin.  Left Shoulder: Negative impingement. No atrophy noted upon inspection. Normal elevation ROM and stability. Normal skin.    Right Hand: No atrophy noted upon inspection. Normal ROM and stability. Normal skin.  Left Hand: No atrophy noted upon inspection. Normal ROM and stability. Normal skin.      Musculoskeletal:  Lumbar Spine Exam  Inspection: No swelling, erythema, deformity, atrophy or hypertrophy noted  Lumbar Spine ROM: mildly restricted  Facet Loading Test: Positive Bilaterally  Spine Palpation: normal skin, diffusely tender to palpation    **Nerve Tension Signs: SLR negative    Sacroiliac Joint:   Inspection: Normal alignment. No erythema, discoloration, or asymmetry.  Palpation: No tenderness at PSIS  FABER: RLE Deferred, LLE Deferred  FAIR: RLE Deferred, LLE Deferred  Active SLR: RLE Deferred, LLE Deferred    Right Hip/Knee:   No pain with hip internal rotation/loading. Normal internal rotation ROM.   No knee pain with flexion/extension. No atrophy noted upon inspection. Normal skin. Normal ROM and stability.  Left Hip/Knee:   No pain with hip internal rotation/loading. Normal internal rotation ROM.   No knee pain with flexion/extension. No atrophy noted upon inspection. Normal skin. Normal ROM and stability.      Medical Decision Making:  Personally reviewed recent documentation from the referring provider.     Test Results:  Personally reviewed the images and official reports of the following diagnostic studies:    T-spine XR. [place]. Date:06/2021.  Impression: Mild degenerative changes    L-spine XR. [place]. Date:06/2021.  Impression: Minimal dextroconvex curvature. Minimal stepwise retrolisthesis of L1-L4    Images were reviewed by myself, Erik Obey DNP, on the PACS monitor. The available reports were reviewed.      Labs:  No results found for: A1C    Lab Results   Component Value Date    BUN 8 (L) 02/09/2021       Lab Results   Component Value Date    CREATININE 0.54 (L) 02/09/2021         Discussion:  ?? Clinical findings, diagnostic/treatment options, and plan were discussed with the patient.  ?? Activities - Advised gradual return to normal activities, using pain as a guide.  ?? Activities - Advised activities as tolerated, using pain as a guide.  ?? Activities - Advised falls precautions (assistive device, rugs, lights, grab bars)  ?? Activities - Encouraged walking  ?? Conservative Care - Options discussed.  ?? Conservative Care - Warning symptoms were discussed.  ?? Medications - risks/benefits of Medrol dose pack, Robaxin, mobic  were discussed.      cc: Lori Pho, MD, No PCP Per Patient  ---------------------------------------  Erik Obey, DNP, FNP-BC  [facility] - Physical Medicine & Rehabilitation

## 2021-07-21 NOTE — Unmapped (Signed)
Your diagnosis:   1. Lumbar strain, initial encounter        Recommendations/Plan  Activity: Activities as tolerated, Recommend progressive/daily walking., To prevent falls, remove small loose rugs, keep hallway lights on, and install grab bars., and Monitor your symptoms. Be mindful of the warning symptoms we discussed.  Conservative Care: Encourage daily walking  Recommend: Tylenol up to 1000mg   PO TID PRN for pain. Do not exceed daily dose of 3000mg ., OTC topical menthol analgesic (ie. Biofreeze) according to package instructions, OTC diclofenac or Voltaren gel BID PRN for pain according to package instructions, and OTC lidocaine patches (ie Solanpas) according to package instructions. Counseled regarding common side effects and appropriate administration of medications.    Medications prescribed today: Meloxicam (Mobic) 7.5mg  PO BID PRN for pain. Do not take other NSAIDs simultaneously with this medication, Medrol DosePak: Please follow package directions. , and Methocarbomol (Robaxin): Take one pill up to four times a day as needed for pain relief/muscle spasms. Counseled regarding common side effects and appropriate administration of medications.   Please follow up with Vertell Limber, hepatic nurse coordinator at: [phone]. Debbie ordered HCV RNA quantitative pcr, and also hepatic function panel which needs to be completed at [facility].     Seek emergency care for red flag signs such as new or progressive motor weakness, sensory deficits, saddle anesthesia, bowel/bladder dysfunction, gait/coordination disturbance, weight loss, and night pain that should prompt evaluation at nearest ER.

## 2021-07-24 NOTE — Unmapped (Signed)
I don't know what to tell her. She asked to speak to you. For one, she's over-doing it with steroids, Mobic AND ibuprofen. Even if she stops taking the steroid, two NSAIDs is not recommended.

## 2021-07-24 NOTE — Unmapped (Signed)
Pt. also called the clinic this afternoon. Said that you were going to offer her something for pain but she declined. I wish I'd kept my mouth shut and hadn't said 'no'! States that she can't take the Medrol because it makes her feel like her heart is beating out of her chest. I reviewed her chart for the plan at her visit. States that she's taking Robaxin, taking Mobic, everything just like she said. Also said that she's taking ibuprofen. Said she also called [facility] about starting PT, but with workers' comp., she has to [facility] and wait 4 hrs. for someone to call her back so that's a delay. Had to have her mom come get her kids because she can't take care of them. Couldn't go to work, but her job said to get pain under control before coming to work. Requesting a call back. She would love a call back today, but I told her I couldn't make that promise due to your clinic schedule today.

## 2021-08-10 ENCOUNTER — Other Ambulatory Visit: Payer: Self-pay

## 2021-08-10 ENCOUNTER — Encounter: Payer: Self-pay | Admitting: Emergency Medicine

## 2021-08-10 ENCOUNTER — Emergency Department
Admission: EM | Admit: 2021-08-10 | Discharge: 2021-08-10 | Disposition: A | Discharge status: Home / Self Care | Payer: Self-pay | Attending: Emergency Medicine | Admitting: Emergency Medicine

## 2021-08-10 DIAGNOSIS — U071 COVID-19: Secondary | ICD-10-CM | POA: Insufficient documentation

## 2021-08-10 DIAGNOSIS — F17210 Nicotine dependence, cigarettes, uncomplicated: Secondary | ICD-10-CM | POA: Insufficient documentation

## 2021-08-10 DIAGNOSIS — I10 Essential (primary) hypertension: Secondary | ICD-10-CM | POA: Insufficient documentation

## 2021-08-10 DIAGNOSIS — J45909 Unspecified asthma, uncomplicated: Secondary | ICD-10-CM | POA: Insufficient documentation

## 2021-08-10 LAB — URINALYSIS, COMPLETE (UACMP) WITH MICROSCOPIC
Bacteria, UA: NONE SEEN
Bilirubin Urine: NEGATIVE
Glucose, UA: NEGATIVE mg/dL
Hgb urine dipstick: NEGATIVE
Ketones, ur: 5 mg/dL — AB
Leukocytes,Ua: NEGATIVE
Nitrite: NEGATIVE
Protein, ur: NEGATIVE mg/dL
Specific Gravity, Urine: 1.017 (ref 1.005–1.030)
pH: 5 (ref 5.0–8.0)

## 2021-08-10 LAB — CBC WITH DIFFERENTIAL/PLATELET
Abs Immature Granulocytes: 0.03 10*3/uL (ref 0.00–0.07)
Basophils Absolute: 0 10*3/uL (ref 0.0–0.1)
Basophils Relative: 0 %
Eosinophils Absolute: 0 10*3/uL (ref 0.0–0.5)
Eosinophils Relative: 1 %
HCT: 33.2 % — ABNORMAL LOW (ref 36.0–46.0)
Hemoglobin: 10.9 g/dL — ABNORMAL LOW (ref 12.0–15.0)
Immature Granulocytes: 1 %
Lymphocytes Relative: 13 %
Lymphs Abs: 0.7 10*3/uL (ref 0.7–4.0)
MCH: 29.9 pg (ref 26.0–34.0)
MCHC: 32.8 g/dL (ref 30.0–36.0)
MCV: 91.2 fL (ref 80.0–100.0)
Monocytes Absolute: 0.7 10*3/uL (ref 0.1–1.0)
Monocytes Relative: 14 %
Neutro Abs: 3.6 10*3/uL (ref 1.7–7.7)
Neutrophils Relative %: 71 %
Platelets: 368 10*3/uL (ref 150–400)
RBC: 3.64 MIL/uL — ABNORMAL LOW (ref 3.87–5.11)
RDW: 13.9 % (ref 11.5–15.5)
WBC: 5 10*3/uL (ref 4.0–10.5)
nRBC: 0 % (ref 0.0–0.2)

## 2021-08-10 LAB — RESP PANEL BY RT-PCR (FLU A&B, COVID) ARPGX2
Influenza A by PCR: NEGATIVE
Influenza B by PCR: NEGATIVE
SARS Coronavirus 2 by RT PCR: POSITIVE — AB

## 2021-08-10 LAB — COMPREHENSIVE METABOLIC PANEL
ALT: 8 U/L (ref 0–44)
AST: 30 U/L (ref 15–41)
Albumin: 3.4 g/dL — ABNORMAL LOW (ref 3.5–5.0)
Alkaline Phosphatase: 86 U/L (ref 38–126)
Anion gap: 12 (ref 5–15)
BUN: <5 mg/dL — ABNORMAL LOW (ref 6–20)
CO2: 22 mmol/L (ref 22–32)
Calcium: 8.7 mg/dL — ABNORMAL LOW (ref 8.9–10.3)
Chloride: 99 mmol/L (ref 98–111)
Creatinine, Ser: 0.63 mg/dL (ref 0.44–1.00)
GFR, Estimated: >60 mL/min (ref >60)
Glucose, Bld: 93 mg/dL (ref 70–99)
Potassium: 4.6 mmol/L (ref 3.5–5.1)
Sodium: 133 mmol/L — ABNORMAL LOW (ref 135–145)
Total Bilirubin: 1.2 mg/dL (ref 0.3–1.2)
Total Protein: 8.1 g/dL (ref 6.5–8.1)

## 2021-08-10 LAB — POC URINE PREG, ED: Preg Test, Ur: NEGATIVE

## 2021-08-10 NOTE — ED Triage Notes (Signed)
Pt reports that she developed a fever, headache and bodyaches, she works at a nursing home, she gets tested for COVID but the last time she was tested was a week ago.

## 2021-08-10 NOTE — ED Provider Notes (Signed)
[facility]-EMERGENCY DEPARTMENT  ____________________________________________  Time seen: Approximately 8:38 PM  I have reviewed the triage vital signs and the nursing notes.   HISTORY  Chief Complaint Fever, Chills, and Headache   Historian Patient     HPI Denise Rubio is a 35 y.o. female with a history of hypertension, asthma and heroin use, presents to the emergency department with fever, headaches, low back pain and body aches that started today.  Patient denies chest pain, chest tightness or abdominal pain.  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  No other alleviating measures have been attempted.   Past Medical History:  Diagnosis Date   Asthma    Heroin abuse (HCC)    Hypertension      Immunizations up to date:  Yes.     Past Medical History:  Diagnosis Date   Asthma    Heroin abuse (HCC)    Hypertension     Patient Active Problem List   Diagnosis Date Noted   SIRS (systemic inflammatory response syndrome) (HCC) 04/01/2016   IV drug abuse (HCC) 04/01/2016   HTN (hypertension) 04/01/2016    Past Surgical History:  Procedure Laterality Date   ABSCESS DRAINAGE     TONSILLECTOMY     TUBAL LIGATION      Prior to Admission medications   Medication Sig Start Date End Date Taking? Authorizing Provider  ALPRAZolam (XANAX) 0.25 MG tablet Take 1 tablet (0.25 mg total) by mouth at bedtime as needed for anxiety. 04/02/16   Shaune Pollack, MD  doxycycline (VIBRAMYCIN) 50 MG capsule Take 1 capsule (50 mg total) by mouth 2 (two) times daily. 04/02/16   Shaune Pollack, MD  naproxen (NAPROSYN) 500 MG tablet Take 1 tablet (500 mg total) by mouth 2 (two) times daily with a meal. 11/18/16   Joni Reining, PA-C  nicotine (NICODERM CQ - DOSED IN MG/24 HOURS) 14 mg/24hr patch Place 1 patch (14 mg total) onto the skin daily. 04/02/16   Shaune Pollack, MD    Allergies Patient has no known allergies.  Family History  Problem Relation  Age of Onset   Cancer Neg Hx     Social History Social History   Tobacco Use   Smoking status: Every Day    Packs/day: 1.00    Types: Cigarettes   Smokeless tobacco: Never  Substance Use Topics   Alcohol use: No   Drug use: Yes    Types: Marijuana    Comment: heroin      Review of Systems  Constitutional: Patient has fever.  Eyes:  No discharge ENT: No upper respiratory complaints. Respiratory: no cough. No SOB/ use of accessory muscles to breath Gastrointestinal:   No nausea, no vomiting.  No diarrhea.  No constipation. Musculoskeletal: Patient has bodyaches.  Skin: Negative for rash, abrasions, lacerations, ecchymosis.   ____________________________________________   PHYSICAL EXAM:  VITAL SIGNS: ED Triage Vitals  Enc Vitals Group     BP 08/10/21 1810 (!) 169/89     Pulse Rate 08/10/21 1810 (!) 110     Resp 08/10/21 1810 (!) 22     Temp 08/10/21 1810 100 F (37.8 C)     Temp Source 08/10/21 1810 Oral     SpO2 08/10/21 1810 100 %     Weight 08/10/21 1811 148 lb (67.1 kg)     Height 08/10/21 1811 5\' 1"  (1.549 m)     Head Circumference --      Peak Flow --  Pain Score 08/10/21 1811 8     Pain Loc --      Pain Edu? --      Excl. in GC? --      Constitutional: Alert and oriented. Patient is lying supine. Eyes: Conjunctivae are normal. PERRL. EOMI. Head: Atraumatic. ENT:      Ears: Tympanic membranes are mildly injected with mild effusion bilaterally.       Nose: No congestion/rhinnorhea.      Mouth/Throat: Mucous membranes are moist. Posterior pharynx is mildly erythematous.  Hematological/Lymphatic/Immunilogical: No cervical lymphadenopathy.  Cardiovascular: Normal rate, regular rhythm. Normal S1 and S2.  Good peripheral circulation. Respiratory: Normal respiratory effort without tachypnea or retractions. Lungs CTAB. Good air entry to the bases with no decreased or absent breath sounds. Gastrointestinal: Bowel sounds 4 quadrants. Soft and nontender  to palpation. No guarding or rigidity. No palpable masses. No distention. No CVA tenderness. Musculoskeletal: Full range of motion to all extremities. No gross deformities appreciated. Neurologic:  Normal speech and language. No gross focal neurologic deficits are appreciated.  Skin:  Skin is warm, dry and intact. No rash noted. Psychiatric: Mood and affect are normal. Speech and behavior are normal. Patient exhibits appropriate insight and judgement.   ____________________________________________   LABS (all labs ordered are listed, but only abnormal results are displayed)  Labs Reviewed  RESP PANEL BY RT-PCR (FLU A&B, COVID) ARPGX2 - Abnormal; Notable for the following components:      Result Value   SARS Coronavirus 2 by RT PCR POSITIVE (*)    All other components within normal limits  URINALYSIS, COMPLETE (UACMP) WITH MICROSCOPIC - Abnormal; Notable for the following components:   Color, Urine YELLOW (*)    APPearance HAZY (*)    Ketones, ur 5 (*)    All other components within normal limits  CBC WITH DIFFERENTIAL/PLATELET - Abnormal; Notable for the following components:   RBC 3.64 (*)    Hemoglobin 10.9 (*)    HCT 33.2 (*)    All other components within normal limits  COMPREHENSIVE METABOLIC PANEL - Abnormal; Notable for the following components:   Sodium 133 (*)    BUN <5 (*)    Calcium 8.7 (*)    Albumin 3.4 (*)    All other components within normal limits  POC URINE PREG, ED   ____________________________________________  EKG   ____________________________________________  RADIOLOGY   No results found.  ____________________________________________    PROCEDURES  Procedure(s) performed:     Procedures     Medications - No data to display   ____________________________________________   INITIAL IMPRESSION / ASSESSMENT AND PLAN / ED COURSE  Pertinent labs & imaging results that were available during my care of the patient were reviewed by me  and considered in my medical decision making (see chart for details).      Assessment and Plan:  COVID-[age] year old female presents to the emergency department with COVID-19 symptoms.  CBC and CMP were reassuring.  Urinalysis showed no signs of UTI.  COVID-19 testing was positive.  Recommended rest and hydration at home and Tylenol and ibuprofen alternating for fever and body aches.     ____________________________________________  FINAL CLINICAL IMPRESSION(S) / ED DIAGNOSES  Final diagnoses:  COVID-19      NEW MEDICATIONS STARTED DURING THIS VISIT:  ED Discharge Orders     None           This chart was dictated using voice recognition software/Dragon. Despite best efforts to proofread, errors can occur which  can change the meaning. Any change was purely unintentional.     Orvil Feil, PA-C 08/10/21 2238    Phineas Semen, MD 08/11/21 [phone]

## 2021-09-04 NOTE — Unmapped (Signed)
RN spoke with patient, I would like to be released to go back to work.

## 2021-09-04 NOTE — Unmapped (Signed)
Reason for call: Coordinate new apt   ??  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  Pt has become lost to follow since starting HCV treatment with Epclusa. Pt never received her last refill from [facility] to complete 12 wks with Epclusa.. I spoke with pt and she was at work and will call back after 3:00 pm. Pt said can go into [facility] to complete labsbut she is no longer insured.  Pt had a back injury and was put on light duty at work. I can make a follow up apt via tele health with Erskine Squibb, CPP once she completes labs. Pt will give me a call back after work.       Vertell Limber RN, [facility]   Pharmacy Department  [facility]  [address]   [place], [place] [address]  [phone]

## 2021-09-06 NOTE — Unmapped (Signed)
Reason for call: Coordinate follow up apt + labs to assess for cure    Pt never returned my call after work on Monday. I called pt after 3:00 pm when she is off work to get pt scheduled for follow up apt and labs to assess for cure. I was not able to reach pt or leave a VM because mailbox is full.       Vertell Limber RN, [facility]   Pharmacy Department  [facility]  [address]   [place], [place] [address]  [phone]

## 2021-09-27 NOTE — Unmapped (Signed)
Reason for call: Coordinate follow up apt + labs to assess for cure??    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 I was not able to reach pt. Her mailbox has not been set up so I could not leave a VM. I sent pt a my chart message.       Vertell Limber RN, [facility]   Pharmacy Department  [facility]  [address]   [place], [place] [address]  [phone]

## 2021-10-19 NOTE — Unmapped (Signed)
Reason for call: Coordinate follow up apt + labs to assess for cure??    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 Unable to reach patient and mailbox was full.     Loura Back, PharmD Candidate  Call Completed with Vertell Limber, Nurse Care Coordinator    Also left message for pt's BF, Nolberto Hanlon and requested to return my call.   Park Breed, Pharm D., BCPS, BCGP, CPP  [facility]  [address]  [place], [place] [address]  [phone]

## 2021-11-09 NOTE — Unmapped (Signed)
Reason for call: Coordinate follow up apt + labs to assess for cure??  ??  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  I called pt at phone # [phone] to schedule follow up apt + labs to check for cure following HCV treatment. I was not able to reach pt and could not leave a VM because mailbox is full.       Vertell Limber RN, [facility]   Pharmacy Department  [facility]  [address]   [place], [place] [address]  [phone]

## 2021-11-22 NOTE — Unmapped (Signed)
Reason for call: Coordinate follow up apt + labs to assess for cure??  ??  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  I made another attempt to reach  pt at phone # [phone] to schedule follow up apt + labs to check for cure following HCV treatment. I was not able to reach pt and could not leave a VM because mailbox is full.     Vertell Limber RN, [facility]   Pharmacy Department  [facility]  [address]   [place], [place] [address]  [phone]

## 2021-12-07 NOTE — Unmapped (Signed)
Reason for call: Coordinate follow up apt + labs to assess for cure??  ??  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  I made another attempt to reach  pt at phone # [phone] to schedule follow up apt + labs to check for cure following HCV treatment. I was not able to reach pt and could not leave a VM because mailbox is full.     Vertell Limber RN, [facility]   Pharmacy Department  [facility]  [address]   [place], [place] [address]  [phone]

## 2022-01-10 NOTE — Unmapped (Signed)
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 Pt was treated with Epclusa but we know she never received her last 4 wks of treatment from pharmacy. We have left multiple messages for pt but no response. I was unable to leave pt a VM because mailbox is full.       Vertell Limber RN, [facility]   Pharmacy Department  [facility]  [address]   [place], [place] [address]  [phone]

## 2022-02-15 NOTE — Unmapped (Signed)
Reason for call: Coordinate new apt to assess for cure  ??  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 I have tried several times to reach pt with no response.The phone would not ring when I called. I do not believe it is in service. I sent pt a my chart message requesting a return call to set up labs. I provided my contact number.       Vertell Limber RN, [facility]   Pharmacy Department  [facility]  [address]   [place], [place] [address]  [phone]

## 2022-03-14 NOTE — Unmapped (Signed)
Reason for call: Coordinate??new apt??to assess for cure  ??  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 I sent pt a my chart message requesting for pt to please return my call. Contact number provided.     Vertell Limber RN, [facility]   Pharmacy Department  [facility]  [address]   [place], [place] [address]  [phone]

## 2022-04-04 NOTE — Unmapped (Signed)
Reason for call: Coordinate??new apt??to assess for cure  ??  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 Pt has been lost to follow since starting treatment with Epclusa. Pt did not receive her last 4 wks of treatment and we have not been able to reach pt. I have left my chart messages but still no response. The number we have for pt is no longer working.       Vertell Limber RN, [facility]   Pharmacy Department  [facility]  [address]   [place], [place] [address]  [phone]

## 2022-05-12 ENCOUNTER — Ambulatory Visit: Admit: 2022-05-12 | Discharge: 2022-05-12 | Disposition: A | Discharge status: Home / Self Care | Payer: MEDICAID

## 2022-05-12 DIAGNOSIS — T148XXA Other injury of unspecified body region, initial encounter: Principal | ICD-10-CM

## 2022-05-12 MED ADMIN — cyclobenzaprine (FLEXERIL) tablet 10 mg: 10 mg | ORAL | @ 06:00:00 | Stop: 2022-05-12

## 2022-05-12 MED ADMIN — ketorolac (TORADOL) injection 30 mg: 30 mg | INTRAMUSCULAR | @ 06:00:00 | Stop: 2022-05-12

## 2022-05-12 MED ADMIN — lidocaine (LIDODERM) 5 % patch 2 patch: 2 | TRANSDERMAL | @ 07:00:00 | Stop: 2022-05-12

## 2022-05-12 NOTE — Unmapped (Signed)
[facility]  Emergency Department Provider Note          ED Clinical Impression      Final diagnoses:   Muscle strain (Primary)       Impression, Medical Decision Making, Progress Notes and Critical Care      Impression, Differential Diagnosis and Plan of Care    Patient is a 36 y.o. 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    On exam, patient's vital signs showing normal blood pressure, tachycardia to 124, afebrile, saturating 98% on room air with no signs of any respiratory distress. Exam is remarkable for paraspinal thoracic and cervical tenderness. No midline tenderness is noted. The exam is otherwise reassuring.     Differential includes muscle strain vs muscle spasm.  With no trauma fracture highly unlikely.      Will give flexeril, Toradol, and a lidocaine patch.        Considerations Regarding Disposition/Escalation of Care and Critical Care    Indications for observation/admission (or consideration of observation/admission) and/or appropriateness for outpatient management: No indication for admission- she reports similar prior event which was properly treated with muscle relaxants  Patient/Family/Caregiver Discussions: Patient amenable to discharge after toradol/flexeril as this resolved similar event in past  Diagnostic Tests Considered But Not Done: no spine imaging indicated with no midline TTP and no trauma       Patient treated with toradol IM, flexeril, and lidocaine patches.  Will discharge home with PCP followup.      Portions of this record have been created using Scientist, clinical (histocompatibility and immunogenetics). Dictation errors have been sought, but may not have been identified and corrected.    See chart and resident provider documentation for details.    ____________________________________________         History Reason for Visit  Muscle Pain    HPI   Lori Bowers is a 36 y.o. female with a PMH of HTN, asthma, and heroin use presenting to the ED for evaluation of muscle pain. 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 She believes that her current symptoms are related to an incident 5 days ago when she was lifting a patient at work and believes she pulled some muscles. She denies any recent falls or trauma. The patient reports using Ibuprofen, Tylenol, along with ice and heat packs with little symptom relief. However, she states she did use a Lidocaine patch a couple days ago with slight symptom relief. The patient denies any abdominal pain, hematuria, dysuria, increased urinary frequency, hematochezia, or melena.     Outside Historian(s)  (EMS, Significant Other, Family, Parent, Caregiver, Friend, Law Enforcement, etc.)    None    External Records Reviewed  (Inpatient/Outpatient notes, Prior labs/imaging studies, Care Everywhere, PDMP, External ED notes, etc)    07/15/2021 [facility] ED Encounter with Dr. Maggie Schwalbe for evaluation of lumbar strain.     Past Medical History:   Diagnosis Date    Anxiety     Brain cancer (CMS-HCC)     Breast abrasion, left, sequela     Breast cancer (CMS-HCC)     Hepatitis C     Hypertension     Injury of breast,  left 2014    hit breast on door        Patient Active Problem List   Diagnosis    Chronic hepatitis C without hepatic coma (CMS-HCC)       Past Surgical History:   Procedure Laterality Date    BREAST LUMPECTOMY      PR DRAIN SKIN ABSCESS SIMPLE Left 11/03/2017    Procedure: INCISION & DRAINAGE OF ABSCESS; SIMPLE OR SINGLE;  Surgeon: Colon Branch, MD;  Location: [facility];  Service: General Surgery    TONSILLECTOMY AND ADENOIDECTOMY      TUBAL LIGATION         Current Facility-Administered Medications:     levonorgestrel (MIRENA) 20 mcg/24 hr (5 years) IUD 1 kit, 1 each, Intrauterine, Once, Lucianne Sullivan Lone, MD    lidocaine (LIDODERM) 5 % patch 2 patch, 2 patch, Transdermal, Once, Merrie Roof, MD, 2 patch at 05/12/22 0230    Current Outpatient Medications:     cyclobenzaprine (FLEXERIL) 10 MG tablet, Take 1 tablet (10 mg total) by mouth two (2) times a day as needed for muscle spasms., Disp: 20 tablet, Rfl: 0    ibuprofen (MOTRIN) 600 MG tablet, Take 600 mg by mouth every six (6) hours as needed., Disp: , Rfl:     lidocaine (LIDODERM) 5 % patch, Place 1 patch on the skin daily. Apply to affected area for 12 hours only each day (then remove patch), Disp: 30 patch, Rfl: 0    methylPREDNISolone (MEDROL DOSEPACK) 4 mg tablet, follow package directions, Disp: 1 each, Rfl: 0    naproxen (NAPROSYN) 500 MG tablet, Take 1 tablet (500 mg total) by mouth in the morning and 1 tablet (500 mg total) in the evening. Take with meals. Do all this for 15 days., Disp: 30 tablet, Rfl: 0    sofosbuvir-velpatasvir (EPCLUSA) 400-100 mg tablet, Take 1 tablet by mouth daily., Disp: 28 tablet, Rfl: 2    Allergies  Patient has no known allergies.    Family History   Problem Relation Age of Onset    Hypertension Mother     Plantar fasciitis Mother     Arthritis Mother     Lung cancer Father     Hypertension Sister     Hypertension Brother     Hypertension Brother      Social History  Social History     Tobacco Use    Smoking status: Every Day     Packs/day: 0.50     Types: Cigarettes    Smokeless tobacco: Never   Vaping Use    Vaping Use: Some days   Substance Use Topics    Alcohol use: Not Currently     Comment: No alcohol use x 2 years.     Drug use: Not Currently     Types: IV, Heroin     Comment: No heroin for two years.      Review of Systems    Constitutional: Negative for fever.  Eyes: Negative for visual changes.  ENT: Negative for sore throat.  Cardiovascular: Negative for chest pain.  Respiratory: Negative for shortness of breath.  Gastrointestinal: Negative for abdominal pain, vomiting or diarrhea.  Genitourinary: Negative for dysuria.   Musculoskeletal: +back pain.  Skin: Negative for rash.  Neurological: Negative for headaches, focal weakness or numbness.  All other systems reviewed and otherwise negative      Physical Exam     ED Triage Vitals   Enc Vitals Group  BP 05/12/22 0150 160/111      Heart Rate 05/12/22 0150 124      SpO2 Pulse --       Resp 05/12/22 0150 18      Temp 05/12/22 0150 36.9 ??C (98.4 ??F)      Temp Source 05/12/22 0150 Oral      SpO2 05/12/22 0150 98 %      Weight 05/12/22 0146 71.6 kg (157 lb 12.8 oz)      Height 05/12/22 0146 1.549 m (5' 1)     Constitutional: Alert and oriented. Well appearing and in no distress.  Eyes: Conjunctivae are normal.  ENT       Head: Normocephalic and atraumatic.       Nose: No congestion.       Mouth/Throat: Mucous membranes are moist.       Neck: No stridor.  Hematological/Lymphatic/Immunilogical: No cervical lymphadenopathy.  Cardiovascular: Normal rate, regular rhythm. Normal and symmetric distal pulses are present in all extremities.  Respiratory: Normal respiratory effort. Breath sounds are normal.  Gastrointestinal: Soft and nontender. There is no CVA tenderness.  Musculoskeletal: Normal range of motion in all extremities. Paraspinal thoracic and cervical tenderness. No midline tenderness.        Right lower leg: No tenderness or edema.       Left lower leg: No tenderness or edema.  Neurologic: Normal speech and language. No gross focal neurologic deficits are appreciated.  Skin: Skin is warm, dry and intact. No rash noted.  Psychiatric: Mood and affect are normal. Speech and behavior are normal.      Documentation assistance was provided by Chyrel Masson, Scribe, on May 12, 2022 at 2:06 AM for Lori Rise, MD.    May 12, 2022 2:44 AM. Documentation assistance provided by the scribe. I was present during the time the encounter was recorded. The information recorded by the scribe was done at my direction and has been reviewed and validated by me.           Merrie Roof, MD  05/12/22 [phone]

## 2022-05-12 NOTE — Unmapped (Signed)
Pt arrives with C/O upper body pain, states like she pulled some muscles. 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

## 2023-02-16 ENCOUNTER — Ambulatory Visit
Admit: 2023-02-16 | Discharge: 2023-02-16 | Disposition: A | Discharge status: Home / Self Care | Payer: PRIVATE HEALTH INSURANCE | Attending: Emergency Medicine

## 2023-02-16 ENCOUNTER — Emergency Department
Admit: 2023-02-16 | Discharge: 2023-02-16 | Disposition: A | Discharge status: Home / Self Care | Payer: PRIVATE HEALTH INSURANCE | Attending: Emergency Medicine

## 2023-02-16 DIAGNOSIS — N611 Abscess of the breast and nipple: Principal | ICD-10-CM

## 2023-02-16 LAB — CBC W/ AUTO DIFF
BASOPHILS ABSOLUTE COUNT: 0 10*9/L (ref 0.0–0.1)
BASOPHILS RELATIVE PERCENT: 0.1 %
EOSINOPHILS ABSOLUTE COUNT: 0.4 10*9/L (ref 0.0–0.5)
EOSINOPHILS RELATIVE PERCENT: 4.3 %
HEMATOCRIT: 45 % — ABNORMAL HIGH (ref 34.0–44.0)
HEMOGLOBIN: 14.9 g/dL (ref 11.3–14.9)
LYMPHOCYTES ABSOLUTE COUNT: 1.6 10*9/L (ref 1.1–3.6)
LYMPHOCYTES RELATIVE PERCENT: 19.2 %
MEAN CORPUSCULAR HEMOGLOBIN CONC: 33.2 g/dL (ref 32.0–36.0)
MEAN CORPUSCULAR HEMOGLOBIN: 31.4 pg (ref 25.9–32.4)
MEAN CORPUSCULAR VOLUME: 94.6 fL (ref 77.6–95.7)
MEAN PLATELET VOLUME: 8 fL (ref 6.8–10.7)
MONOCYTES ABSOLUTE COUNT: 0.5 10*9/L (ref 0.3–0.8)
MONOCYTES RELATIVE PERCENT: 6.5 %
NEUTROPHILS ABSOLUTE COUNT: 5.7 10*9/L (ref 1.8–7.8)
NEUTROPHILS RELATIVE PERCENT: 69.9 %
NUCLEATED RED BLOOD CELLS: 0 /100{WBCs} (ref <=4)
PLATELET COUNT: 284 10*9/L (ref 150–450)
RED BLOOD CELL COUNT: 4.76 10*12/L (ref 3.95–5.13)
RED CELL DISTRIBUTION WIDTH: 13.7 % (ref 12.2–15.2)
WBC ADJUSTED: 8.1 10*9/L (ref 3.6–11.2)

## 2023-02-16 LAB — BASIC METABOLIC PANEL
ANION GAP: 3 mmol/L — ABNORMAL LOW (ref 5–14)
BLOOD UREA NITROGEN: 8 mg/dL — ABNORMAL LOW (ref 9–23)
BUN / CREAT RATIO: 13
CALCIUM: 9 mg/dL (ref 8.7–10.4)
CHLORIDE: 110 mmol/L — ABNORMAL HIGH (ref 98–107)
CO2: 25.1 mmol/L (ref 20.0–31.0)
CREATININE: 0.61 mg/dL
EGFR CKD-EPI (2021) FEMALE: >90 mL/min/{1.73_m2} (ref >=60)
GLUCOSE RANDOM: 93 mg/dL (ref 70–179)
POTASSIUM: 4 mmol/L (ref 3.4–4.8)
SODIUM: 138 mmol/L (ref 135–145)

## 2023-02-16 LAB — HCG QUANTITATIVE, BLOOD: GONADOTROPIN, CHORIONIC (HCG) QUANT: <2.6 m[IU]/mL

## 2023-02-16 MED ORDER — CEFDINIR 300 MG CAPSULE
ORAL_CAPSULE | Freq: Every day | ORAL | 0 refills | 10 days | Status: CP
Start: 2023-02-16 — End: 2023-02-26

## 2023-02-16 MED ORDER — DOXYCYCLINE HYCLATE 100 MG CAPSULE
ORAL_CAPSULE | Freq: Two times a day (BID) | ORAL | 0 refills | 10 days | Status: CP
Start: 2023-02-16 — End: 2023-02-26

## 2023-02-16 MED ORDER — OXYCODONE 5 MG TABLET
ORAL_TABLET | ORAL | 0 refills | 2 days | Status: CP | PRN
Start: 2023-02-16 — End: 2023-02-21

## 2023-02-16 MED ADMIN — oxyCODONE (ROXICODONE) immediate release tablet 5 mg: 5 mg | ORAL | @ 16:00:00 | Stop: 2023-02-16

## 2023-02-16 MED ADMIN — doxycycline (VIBRAMYCIN) 100 mg in sodium chloride 0.9 % (NS) 100 mL IVPB-MBP: 100 mg | INTRAVENOUS | @ 19:00:00 | Stop: 2023-02-16

## 2023-02-16 MED ADMIN — ketorolac (TORADOL) injection 30 mg: 30 mg | INTRAVENOUS | @ 16:00:00 | Stop: 2023-02-16

## 2023-02-16 MED ADMIN — sodium chloride 0.9% (NS) bolus 1,000 mL: 1000 mL | INTRAVENOUS | @ 16:00:00 | Stop: 2023-02-16

## 2023-02-16 MED ADMIN — cefTRIAXone (ROCEPHIN) 1 g in sodium chloride 0.9 % (NS) 100 mL IVPB-MBP: 1 g | INTRAVENOUS | @ 16:00:00 | Stop: 2023-02-16

## 2023-02-16 NOTE — Unmapped (Addendum)
[facility]  Emergency Department Provider Note       ED Clinical Impression     Final diagnoses:   Breast abscess (Primary)        Impression, ED Course, Assessment and Plan     Impression:     Patient is a 37 y.o. female with PMH of hepatitis C, hypertension, and anxiety presenting for 2 days of persistent pain, swelling, and redness to the left breast in the setting of past episodes of mastitis.     On exam, the patient appears nontoxic and in no acute distress. VS are significant for hypertension, likely multifactorial in the setting of baseline hypertension and her pain-she is without signs or symptoms of hypertensive emergency without chest pain, shortness of breath, focal weakness or numbness. Physical exam with 2x3 cm area of fluctuance under the left areola. Trace overlying erythema and warmth. No purulence. Piercing site appears intact. No surrounding erythema to the left chest wall. Tender left axillary adenopathy. Physical exam performed with female chaperone Annice Pih at bedside.  Please review images in patient media tab-obtained with patient consent.    Differential includes mastitis, abscess, although no discrete abscess, consider phlegmon, consider granulation tissue from her piercing, patient without any breast-feeding concomitantly, also consider malignancy-patient states that she has had mammographies in the past without evidence of this.  As well it is tender and pain is reproducible with tender adenopathy in the left axilla suspicious of acute infectious etiology.  Presentation is not consistent with sepsis, bacteremia, she is quite well-appearing and nontoxic.    Plan for labs and left breast US. Will give dose of oxycodone and Toradol for pain control-discussed risks and benefits of these medications, including the chronic narcotics with the patient and she has politely elected to receive these medications as she states that she has had them without rebound of her prior narcotic abuse in the past. Will empirically start IV antibiotics.     Reassessment-     1:30 PM  Spoke to senior radiology resident Jorja Loa who will coordinate mammography guided abscess drainage for the patient on Monday. They will coordinate with fellow to have this done tomorrow when the fellow is in.  Upon reassessment patient is feeling much improved and feels her pain is much better.  She feels very happy with this plan as she is not interested in being transferred to [facility] for definitive evaluation today.  I believe this is appropriate given that she is quite well-appearing, nontoxic, without systemic signs or symptoms, her abscess is quite small.  In the meantime I discussed the importance of returning if she has rapid progression of her erythema, worsening of her pain, systemic symptoms such as fevers, vomiting or any other concerning symptoms.  Discussed for what ever reason if the care plan falls through that she should return to the [facility] emergency department for definitive value evaluation by breast surgery/mammography radiology team.  I discussed this care plan of outpatient care with surgeon oncology/breast attending, Dr. Dellis Anes over [facility], and a referral has been placed to surgical oncology.  They are in agreement with this plan.  Otherwise, plan for discharge at this time with antibiotics and a short course of narcotics for breakthrough pain. Will also place referral to breast surgery. Discussed with patient, she is agreeable with plan. I discussed my evaluation of the patient's symptoms, my clinical impression, and my proposed outpatient treatment plan. We have discussed anticipatory guidance, scheduled follow-up, and careful return precautions. I  provided an opportunity and answered any questions the patient had. The patient express understanding and are comfortable with the discharge plan.      Additional Medical Decision Making       Discussion of Management with other Physicians, QHP or Appropriate Source: Discussion with radiology above as well as Dr. Dellis Anes.  Independent Interpretation of Studies:   RADIOLOGY: as below     Labs: No leukocytosis, no left shift, normal chemistry panel, negative pregnancy test.  External Records Reviewed: Patient's most recent outpatient clinic note  Escalation of Care, Consideration of Admission/Observation/Transfer:  N/A  Social determinants that significantly affected care: None applicable  Prescription drug(s) considered but not prescribed:   Diagnostic tests considered but not performed:   History obtained from other sources: None    Labs and radiology results that were available during my care of the patient were independently reviewed by me and considered in my medical decision making.    Portions of this record have been created using Scientist, clinical (histocompatibility and immunogenetics). Dictation errors have been sought, but may not have been identified and corrected.  ____________________________________________    Time seen: February 16, 2023 10:35 AM     History     Chief Complaint  Breast Pain      HPI   Lori Bowers is a 37 y.o. female with PMH of hepatitis C, hypertension, and anxiety presenting for evaluation of breast pain. Patient reports 2 days of persistent pain, swelling, and redness to the left breast.  Pain is sharp, constant and feels as though there is something burning on the inside of her nipple area.  It is worse with palpation and/or touching of the nipple area.  It is nonradiating.  There is no aggravating or alleviating factors.  She tried ibuprofen with minimal relief.  She notes her pain was originally onset after starting her menstrual cycle. She is not currently breastfeeding. Of note, patient has known history of a  recurrent left breast abscess that has required incision and drainage in the past. She states her current symptoms feel similar to prior breast mastitis and abscesses. Patient denies fever or chills.  She denies any trauma to the nipple, including any human biting to the nipple.  She has had similar episodes in the past.  She states that she has had surgical interventions for some of the episodes in the past including drains placed which were performed at [facility].      Past Medical History:   Diagnosis Date    Anxiety     Brain cancer (CMS-HCC)     Breast abrasion, left, sequela     Breast cancer (CMS-HCC)     Hepatitis C     Hypertension     Injury of breast, left 2014    hit breast on door        Past Surgical History:   Procedure Laterality Date    BREAST LUMPECTOMY      PR INCISION & DRAINAGE ABSCESS SIMPLE/SINGLE Left 11/03/2017    Procedure: INCISION & DRAINAGE OF ABSCESS; SIMPLE OR SINGLE;  Surgeon: Colon Branch, MD;  Location: [facility];  Service: General Surgery    TONSILLECTOMY AND ADENOIDECTOMY      TUBAL LIGATION           Current Facility-Administered Medications:     levonorgestrel (MIRENA) 20 mcg/24 hr (5 years) IUD 1 kit, 1 each, Intrauterine, Once, Albertha Ghee, MD    Current Outpatient Medications:  cyclobenzaprine (FLEXERIL) 10 MG tablet, Take 1 tablet (10 mg total) by mouth two (2) times a day as needed for muscle spasms., Disp: 20 tablet, Rfl: 0    ibuprofen (MOTRIN) 600 MG tablet, Take 600 mg by mouth every six (6) hours as needed., Disp: , Rfl:     methylPREDNISolone (MEDROL DOSEPACK) 4 mg tablet, follow package directions, Disp: 1 each, Rfl: 0    sofosbuvir-velpatasvir (EPCLUSA) 400-100 mg tablet, Take 1 tablet by mouth daily., Disp: 28 tablet, Rfl: 2    Allergies  Patient has no known allergies.    Family History   Problem Relation Age of Onset    Hypertension Mother     Plantar fasciitis Mother     Arthritis Mother     Lung cancer Father     Hypertension Sister     Hypertension Brother     Hypertension Brother        Social History  Social History     Tobacco Use    Smoking status: Every Day     Current packs/day: 0.50     Types: Cigarettes    Smokeless tobacco: Never Vaping Use    Vaping status: Some Days   Substance Use Topics    Alcohol use: Not Currently     Comment: No alcohol use x 2 years.     Drug use: Not Currently     Types: IV, Heroin     Comment: No heroin for two years.             Physical Exam     VITAL SIGNS:    ED Triage Vitals [02/16/23 1031]   Enc Vitals Group      BP 175/115      Heart Rate 92      SpO2 Pulse       Resp 18      Temp 37.1 ??C (98.8 ??F)      Temp Source Oral      SpO2 99 %      Weight 75.8 kg (167 lb)     Constitutional: Alert and oriented. Well appearing and in no distress.  Eyes: Conjunctivae are normal.  ENT       Head: Normocephalic and atraumatic.       Nose: No congestion.       Mouth/Throat: Mucous membranes are moist.       Neck: No stridor.  Cardiovascular: Normal rate, regular rhythm.   Respiratory: Normal respiratory effort. Breath sounds are normal.  Gastrointestinal: Soft and nontender, non-distended  Musculoskeletal: No lower extremity tenderness or edema.  Neurologic: Normal speech and language. No gross focal neurologic deficits are appreciated.  Skin: 2x3 cm area of fluctuance under the left areola. Trace overlying erythema and warmth. No purulence. Piercing site appears intact. No surrounding erythema to the left chest wall. Tender left axillary adenopathy.   Psychiatric: Mood and affect are normal. Speech and behavior are normal.     Radiology     US Breast Limited Left   Final Result      ASSESSMENT:   BI-RADS Category: 2-ClinicExam : Benign.  Clinical follow up.      Recommendation Laterality: Left      Clinical follow up is recommended for left breast finding that in the appropriate clinical setting would represent an abscess. After treatment, a follow-up ultrasound can be performed in 7 to 10 days to ensure resolution.         Mammo Aspiration Hematoma/Abscess/Cyst    (Results Pending)  I independently visualized these images.     Procedures       Pertinent labs & imaging results that were available during my care of the patient were reviewed by me and considered in my medical decision making (see chart for details)      Documentation assistance was provided by Winferd Humphrey, Scribe, on February 16, 2023 at 10:59 AM for Carolee Rota, MD.    Documentation assistance was provided by the scribe in my presence.  The documentation recorded by the scribe has been reviewed by me and accurately reflects the services I personally performed.       Berkley Harvey, MD  02/16/23 [phone]

## 2023-02-16 NOTE — Unmapped (Signed)
Patient presents here with c/o left breast pain and swelling since 2 days ago - reports that she has h/o mastitis . Reports that the pain started when she started her menstruation. Denies any fever or chills. Reports that she does not feel good

## 2023-02-19 ENCOUNTER — Other Ambulatory Visit: Payer: Self-pay

## 2023-02-19 ENCOUNTER — Emergency Department: Payer: Medicaid Other

## 2023-02-19 ENCOUNTER — Encounter: Payer: Self-pay | Admitting: *Deleted

## 2023-02-19 ENCOUNTER — Emergency Department
Admission: EM | Admit: 2023-02-19 | Discharge: 2023-02-19 | Disposition: A | Discharge status: Home / Self Care | Payer: Medicaid Other | Attending: Emergency Medicine | Admitting: Emergency Medicine

## 2023-02-19 DIAGNOSIS — N611 Abscess of the breast and nipple: Secondary | ICD-10-CM | POA: Insufficient documentation

## 2023-02-19 DIAGNOSIS — D72829 Elevated white blood cell count, unspecified: Secondary | ICD-10-CM | POA: Insufficient documentation

## 2023-02-19 DIAGNOSIS — I10 Essential (primary) hypertension: Secondary | ICD-10-CM | POA: Diagnosis not present

## 2023-02-19 LAB — COMPREHENSIVE METABOLIC PANEL
ALT: 69 U/L — ABNORMAL HIGH (ref 0–44)
AST: 62 U/L — ABNORMAL HIGH (ref 15–41)
Albumin: 3.6 g/dL (ref 3.5–5.0)
Alkaline Phosphatase: 86 U/L (ref 38–126)
Anion gap: 11 (ref 5–15)
BUN: 10 mg/dL (ref 6–20)
CO2: 22 mmol/L (ref 22–32)
Calcium: 9.1 mg/dL (ref 8.9–10.3)
Chloride: 100 mmol/L (ref 98–111)
Creatinine, Ser: 0.7 mg/dL (ref 0.44–1.00)
GFR, Estimated: >60 mL/min (ref >60)
Glucose, Bld: 77 mg/dL (ref 70–99)
Potassium: 4.5 mmol/L (ref 3.5–5.1)
Sodium: 133 mmol/L — ABNORMAL LOW (ref 135–145)
Total Bilirubin: 1.1 mg/dL (ref 0.3–1.2)
Total Protein: 7.6 g/dL (ref 6.5–8.1)

## 2023-02-19 LAB — CBC WITH DIFFERENTIAL/PLATELET
Abs Immature Granulocytes: 0.05 10*3/uL (ref 0.00–0.07)
Basophils Absolute: 0 10*3/uL (ref 0.0–0.1)
Basophils Relative: 0 %
Eosinophils Absolute: 0.5 10*3/uL (ref 0.0–0.5)
Eosinophils Relative: 4 %
HCT: 45.7 % (ref 36.0–46.0)
Hemoglobin: 15.1 g/dL — ABNORMAL HIGH (ref 12.0–15.0)
Immature Granulocytes: 0 %
Lymphocytes Relative: 20 %
Lymphs Abs: 2.3 10*3/uL (ref 0.7–4.0)
MCH: 31.8 pg (ref 26.0–34.0)
MCHC: 33 g/dL (ref 30.0–36.0)
MCV: 96.2 fL (ref 80.0–100.0)
Monocytes Absolute: 0.9 10*3/uL (ref 0.1–1.0)
Monocytes Relative: 8 %
Neutro Abs: 7.8 10*3/uL — ABNORMAL HIGH (ref 1.7–7.7)
Neutrophils Relative %: 68 %
Platelets: 302 10*3/uL (ref 150–400)
RBC: 4.75 MIL/uL (ref 3.87–5.11)
RDW: 12.7 % (ref 11.5–15.5)
WBC: 11.6 10*3/uL — ABNORMAL HIGH (ref 4.0–10.5)
nRBC: 0 % (ref 0.0–0.2)

## 2023-02-19 MED ORDER — HYDROCODONE-ACETAMINOPHEN 5-325 MG PO TABS: 1.0000 | ORAL_TABLET | ORAL | 0 refills | Status: DC | PRN

## 2023-02-19 MED ORDER — FLUCONAZOLE 50 MG PO TABS: 150.0000 mg | ORAL_TABLET | Freq: Once | ORAL | Status: DC

## 2023-02-19 MED ORDER — ONDANSETRON 4 MG PO TBDP
4.0000 mg | ORAL_TABLET | Freq: Once | ORAL | Status: AC
  Administered 2023-02-19: 4 mg via ORAL
  Filled 2023-02-19: qty 1

## 2023-02-19 MED ORDER — CLINDAMYCIN HCL 300 MG PO CAPS: 300.0000 mg | ORAL_CAPSULE | Freq: Three times a day (TID) | ORAL | 0 refills | Status: AC

## 2023-02-19 MED ORDER — MORPHINE SULFATE (PF) 4 MG/ML IV SOLN
4.0000 mg | Freq: Once | INTRAVENOUS | Status: AC
  Administered 2023-02-19: 4 mg via INTRAMUSCULAR
  Filled 2023-02-19: qty 1

## 2023-02-19 MED ORDER — HYDROCODONE-ACETAMINOPHEN 5-325 MG PO TABS
2.0000 | ORAL_TABLET | Freq: Once | ORAL | Status: AC
  Administered 2023-02-19: 2 via ORAL
  Filled 2023-02-19: qty 2

## 2023-02-19 MED ORDER — FLUCONAZOLE 150 MG PO TABS: 150.0000 mg | ORAL_TABLET | Freq: Every day | ORAL | 0 refills | Status: DC

## 2023-02-19 MED ORDER — CLINDAMYCIN HCL 150 MG PO CAPS
300.0000 mg | ORAL_CAPSULE | Freq: Once | ORAL | Status: AC
  Administered 2023-02-19: 300 mg via ORAL
  Filled 2023-02-19: qty 2

## 2023-02-19 NOTE — ED Provider Notes (Signed)
[facility] Provider Note    Event Date/Time   First MD Initiated Contact with Patient 02/19/23 1759     (approximate)  History   Chief Complaint: Abscess  HPI  Denise Rubio is a 37 y.o. female with a past medical history of IV drug abuse, hypertension, presents emergency department for left breast abscess.  According to the patient since 2014 she has been experiencing intermittent abscesses to the left breast.  Patient states she has had 2 prior surgeries for the same.  She has not had any issues over the past 2 to 3 years however she states over the past week or so she has now had tenderness redness swelling and now some mild drainage to the left breast.  Patient was seen by [facility] 4 days ago was started on doxycycline orally which the patient has been taking but states continued enlargement redness and tenderness of the left breast so she came to the emergency department for evaluation.  Physical Exam   Triage Vital Signs: ED Triage Vitals  Enc Vitals Group     BP 02/19/23 1743 (!) 198/107     Pulse Rate 02/19/23 1743 87     Resp 02/19/23 1743 18     Temp 02/19/23 1743 99.1 F (37.3 C)     Temp Source 02/19/23 1743 Oral     SpO2 02/19/23 1743 98 %     Weight 02/19/23 1744 167 lb (75.8 kg)     Height 02/19/23 1744 '5\' 1"'$  (1.549 m)     Head Circumference --      Peak Flow --      Pain Score 02/19/23 1743 8     Pain Loc --      Pain Edu? --      Excl. in [place]? --     Most recent vital signs: Vitals:   02/19/23 1743  BP: (!) 198/107  Pulse: 87  Resp: 18  Temp: 99.1 F (37.3 C)  SpO2: 98%    General: Awake, no distress.  CV:  Good peripheral perfusion.  Regular rate and rhythm  Resp:  Normal effort.  Equal breath sounds bilaterally.  Abd:  No distention.  Soft, nontender.  No rebound or guarding. Other:  Patient has an area approximate 8 to 10 cm in diameter of erythema surrounding the areola superior to the areola there is an area of  pointing with mild drainage.  Area surrounding the areola is indurated and tender.   ED Results / Procedures / Treatments   RADIOLOGY   IMPRESSION: Superficial complex fluid collection within the skin of the upper central periareolar region of the left breast. In the proper clinical setting, this is compatible with superficial infection/mastitis with small deeper component possibly evolving abscess.  RECOMMENDATION: Recommend initiating 7-10 day course of antibiotics as well as further complete diagnostic imaging workup with mammogram/ultrasound at the [facility].  I have discussed the findings and recommendations with the patient. If applicable, a reminder letter will be sent to the patient regarding the next appointment.    MEDICATIONS ORDERED IN ED: Medications  morphine (PF) 4 MG/ML injection 4 mg (4 mg Intramuscular Given 02/19/23 1828)  ondansetron (ZOFRAN-ODT) disintegrating tablet 4 mg (4 mg Oral Given 02/19/23 1827)     IMPRESSION / MDM / ASSESSMENT AND PLAN / ED COURSE  I reviewed the triage vital signs and the nursing notes.  Patient's presentation is most consistent with acute presentation with potential threat to life or  bodily function.  Patient presents emergency department with what appears to be a left breast abscess.  Patient has had 2 prior abscesses to the same area both of which required OR drainage.  We will check labs we will treat pain and obtain an ultrasound to further evaluate.  Patient agreeable to plan.  Patient's workup shows a reassuring chemistry with very slight LFT elevation, overall reassuring CBC with very slight leukocytosis.  Patient's ultrasound shows superficial complex fluid collection that is actively draining now per patient.  Small deeper component possibly evolving abscess but does not appear to be drainable as of yet.  We will switch the patient from doxycycline to clindamycin.  Will have the patient follow-up with the  [facility] breast center for repeat ultrasound.  Patient agreeable to plan of care.  Discussed return precautions for any worsening pain swelling or fever.  FINAL CLINICAL IMPRESSION(S) / ED DIAGNOSES   Left breast abscess  Rx / DC Orders   Clindamycin Norco  Note:  This document was prepared using Dragon voice recognition software and may include unintentional dictation errors.   Harvest Dark, MD 02/19/23 2110

## 2023-02-19 NOTE — Discharge Instructions (Addendum)
As we discussed please call the number provided for the breast center next-door to arrange a follow-up ultrasound in approximately 1 week.  Please begin taking your antibiotic clindamycin 3 times daily starting tomorrow 02/20/2023.  You may discontinue use of doxycycline.  You may take your pain medication as needed but only as prescribed do not drink alcohol or drive while taking this medication.  Return to the emergency department for any significant swelling increasing pain or development of fever.  [place] [address], [place],  [address] [phone]

## 2023-02-19 NOTE — ED Triage Notes (Addendum)
Pt has abscess to left breast for 5 days.  Pt has some drainage.  Hx of abscess same area with surgery per pt.  Pt alert.  Pt taking doxy for 4 days.

## 2023-02-19 NOTE — Progress Notes (Signed)
Responded to consult for IV. Per RN, no longer needed; consult cleared.

## 2023-02-20 NOTE — Unmapped (Signed)
REFERRAL RECEIVED:  02/16/2023 URGENT EPIC              DIAGNOSIS:       LEFT BREAST ABSCESS              REASON FOR REFERRAL: CLINIC FOLLOW-UP    Patient Informed of possible charges associated with pathology? YES     Patient Informed of possible charges associated with imaging? YES      Patient contacted with appointment details. Patient clinic visit scheduled.    ABSCESS HAS DRAINED AND PATIENT TAKING ORAL ANTIBIOTIC RECEIVED FROM  REGIONAL.

## 2023-02-28 ENCOUNTER — Ambulatory Visit
Admit: 2023-02-28 | Discharge: 2023-03-01 | Payer: PRIVATE HEALTH INSURANCE | Attending: Critical Care Medicine | Primary: Critical Care Medicine

## 2023-02-28 ENCOUNTER — Ambulatory Visit: Admit: 2023-02-28 | Discharge: 2023-03-01 | Payer: PRIVATE HEALTH INSURANCE

## 2023-02-28 DIAGNOSIS — N611 Abscess of the breast and nipple: Principal | ICD-10-CM

## 2023-03-06 NOTE — Unmapped (Signed)
REFERRAL CANCELLED: Spoke with patient and the referral was declined. Patient prefers to stay local.

## 2023-04-01 DIAGNOSIS — F1910 Other psychoactive substance abuse, uncomplicated: Secondary | ICD-10-CM

## 2023-04-01 HISTORY — DX: Other psychoactive substance abuse, uncomplicated: F19.10

## 2023-04-01 NOTE — Unmapped (Signed)
Assessment and Plan:     37 yo female presents to establish care     #Primary hypertension, Murmur  BP Readings from Last 3 Encounters:   04/02/23 175/115   02/16/23 175/115   05/12/22 141/117   -Currently not taking any medications. Her history of HTN does appear to be chronic.   -We will start Lisinopril 10mg  every day and obtain CMP  -Additionally she did appear to have a murmur on exam. Given her history of IVDU, poor dentition and murmur on exam, will obtain TTE  -Factors contributing to hypertension: Anxiety, tobacco use, Obesity     #Left breast abscess  -Patient has been seen multiple times for left breast abscess, with last visit to the ED on 02/19/23  -It does appear to be healing well but she has not followed up with the breast clinic yet  -Referral to [facility] placed and encouraged to follow up      #Chronic hepatitis C without hepatic coma (CMS-HCC)  -She was diagnosed with Hepatitis C in 2018.  - She was seeing GI for treatment but was lost to follow up multiple times it appears (last visit in 01/2021).   -It was noted then that she did have increased risk factors for HCV as she was previous IVDU as well as was sexually active with HCV positive partner.   -She does want to pursue treatment   -We will repeat Hepatitis C RNA as well as obtain Liver Ultrasound and refer back to Hepatology for treatment  -Will additionally add on Hepatitis B labs and RPR      #Tobacco user  -She has been smoking for 15 years (started in 2004). Currently smoking 1//2 pack per day   -She does appear to have symptoms.   -We will obtain Spirometry/PFT to assess for COPD       #Polysubstance abuse (CMS-HCC)  -History of polysubstance abuse. History of IVDU as well.   -She has been clean for 4.5 years.       #Anxiety, Insomnia  -PHQ-9 score: 4, GAD-7 score 5  -She notes though that she is having quite severe anxiety.   -Previously tolerated Lexapro. Will restart lexapro 10mg  and then reassess  -Given that she is having anxiety as well as insomnia however, she may benefit from Hydroxyzine instead  -On chart review, previously was on Gabapentin and Seroquel.       #Health Maintenance  -Repeating HIV  -Screening for deficiency anemia: obtaining CBC  -Encounter for screening for lipid disorder: obtaining Lipid panel  -Thyroid disease: on chart review, had thyroid disease? Obtaining TSH  -Hyperglycemia: obtaining A1C  -She is to return for pap smear   -She is due for Tetanus booster and Pneumococcal vaccine given she is a smoker---will discuss next visit  -IUD appears to have been placed 08/05/2017. She notes the reason was for Menorrhagia. Her IUD does need to be changed. Will discuss gyne referral next visit vs seeing provider in-house for IUD removal/insertion.      To follow up in 2-3 weeks for blood pressure as well as pap smear and will discuss immunization level.       Subjective:     Lori Bowers 36 y.o.female   is here to establish care     Concerns for today   -she has been clean 4.5 years ago.   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     She was previously on gab[facility] at [facility].    She notes that Buspar and Hydroxyzine did not work     Insomnia: does not want to take Seroquel because itll gain weight. , notes trazodone will make her too tired.       ROS:     ROS negative unless otherwise noted in HPI    She does endorse Dyspnea and anxiety     Vital Signs:     Wt Readings from Last 3 Encounters:   04/02/23 77.4 kg (170 lb 9.6 oz)   02/16/23 75.8 kg (167 lb)   05/12/22 71.6 kg (157 lb 12.8 oz)     Temp Readings from Last 3 Encounters:   04/02/23 37.1 ??C (98.8 ??F) (Temporal)   02/16/23 37.1 ??C (98.8 ??F) (Oral)   05/12/22 36.9 ??C (98.4 ??F) (Oral)     BP Readings from Last 3 Encounters:   04/02/23 175/115   02/16/23 175/115   05/12/22 141/117     Pulse Readings from Last 3 Encounters:   04/02/23 78   02/16/23 92   05/12/22 117       Objective:     General Appearance: Alert, cooperative, no distress   Head and Neck: Normocephalic, atraumatic.   ENT: EOMI, poor dentition.   Cardiovascular:  Regular rate and rhythm. She did appear to have murmur over right second ICS  Respiratory: Restricted air movement, no cough noted however  Breast: Chaperone present for breast exam. Photo below of left breast. Previous area of infection/abscess is superior to left nipple. However it does appear to be healing well. There is no warmth, tenderness or induration noted   Musculoskeletal: Gait and station unremarkable.   Extremities :No edema  Neurologic: Alert and oriented to place and time. CN II-XII grossly intact.   Psychiatric: talkative. pleasant

## 2023-04-02 ENCOUNTER — Ambulatory Visit
Admit: 2023-04-02 | Discharge: 2023-04-03 | Payer: PRIVATE HEALTH INSURANCE | Attending: Student in an Organized Health Care Education/Training Program | Primary: Student in an Organized Health Care Education/Training Program

## 2023-04-02 DIAGNOSIS — R739 Hyperglycemia, unspecified: Principal | ICD-10-CM

## 2023-04-02 DIAGNOSIS — R011 Cardiac murmur, unspecified: Principal | ICD-10-CM

## 2023-04-02 DIAGNOSIS — F1910 Other psychoactive substance abuse, uncomplicated: Principal | ICD-10-CM

## 2023-04-02 DIAGNOSIS — Z720 Tobacco use: Principal | ICD-10-CM

## 2023-04-02 DIAGNOSIS — I10 Essential (primary) hypertension: Principal | ICD-10-CM

## 2023-04-02 DIAGNOSIS — B182 Chronic viral hepatitis C: Principal | ICD-10-CM

## 2023-04-02 DIAGNOSIS — Z130 Encounter for screening for diseases of the blood and blood-forming organs and certain disorders involving the immune mechanism: Principal | ICD-10-CM

## 2023-04-02 DIAGNOSIS — Z13220 Encounter for screening for lipoid disorders: Principal | ICD-10-CM

## 2023-04-02 DIAGNOSIS — G4700 Insomnia, unspecified: Principal | ICD-10-CM

## 2023-04-02 DIAGNOSIS — N611 Abscess of the breast and nipple: Principal | ICD-10-CM

## 2023-04-02 DIAGNOSIS — Z114 Encounter for screening for human immunodeficiency virus [HIV]: Principal | ICD-10-CM

## 2023-04-02 DIAGNOSIS — F419 Anxiety disorder, unspecified: Principal | ICD-10-CM

## 2023-04-02 LAB — COMPREHENSIVE METABOLIC PANEL
ALBUMIN: 4 g/dL (ref 3.4–5.0)
ALKALINE PHOSPHATASE: 106 U/L (ref 46–116)
ALT (SGPT): 63 U/L — ABNORMAL HIGH (ref 10–49)
ANION GAP: 7 mmol/L (ref 5–14)
AST (SGOT): 51 U/L — ABNORMAL HIGH (ref <=34)
BILIRUBIN TOTAL: 0.5 mg/dL (ref 0.3–1.2)
BLOOD UREA NITROGEN: 9 mg/dL (ref 9–23)
BUN / CREAT RATIO: 16
CALCIUM: 9.4 mg/dL (ref 8.7–10.4)
CHLORIDE: 104 mmol/L (ref 98–107)
CO2: 23 mmol/L (ref 20.0–31.0)
CREATININE: 0.56 mg/dL
EGFR CKD-EPI (2021) FEMALE: >90 mL/min/{1.73_m2} (ref >=60)
GLUCOSE RANDOM: 78 mg/dL (ref 70–179)
POTASSIUM: 4 mmol/L (ref 3.4–4.8)
PROTEIN TOTAL: 7.9 g/dL (ref 5.7–8.2)
SODIUM: 134 mmol/L — ABNORMAL LOW (ref 135–145)

## 2023-04-02 LAB — CBC
HEMATOCRIT: 45.8 % — ABNORMAL HIGH (ref 34.0–44.0)
HEMOGLOBIN: 15.1 g/dL — ABNORMAL HIGH (ref 11.3–14.9)
MEAN CORPUSCULAR HEMOGLOBIN CONC: 33.1 g/dL (ref 32.0–36.0)
MEAN CORPUSCULAR HEMOGLOBIN: 31.9 pg (ref 25.9–32.4)
MEAN CORPUSCULAR VOLUME: 96.4 fL — ABNORMAL HIGH (ref 77.6–95.7)
MEAN PLATELET VOLUME: 9 fL (ref 6.8–10.7)
PLATELET COUNT: 294 10*9/L (ref 150–450)
RED BLOOD CELL COUNT: 4.75 10*12/L (ref 3.95–5.13)
RED CELL DISTRIBUTION WIDTH: 13.4 % (ref 12.2–15.2)
WBC ADJUSTED: 11.7 10*9/L — ABNORMAL HIGH (ref 3.6–11.2)

## 2023-04-02 LAB — LIPID PANEL
CHOLESTEROL/HDL RATIO SCREEN: 3.6 (ref 1.0–4.5)
CHOLESTEROL: 165 mg/dL (ref <=200)
HDL CHOLESTEROL: 46 mg/dL (ref 40–60)
LDL CHOLESTEROL CALCULATED: 91 mg/dL (ref 40–99)
NON-HDL CHOLESTEROL: 119 mg/dL (ref 70–130)
TRIGLYCERIDES: 142 mg/dL (ref 0–150)
VLDL CHOLESTEROL CAL: 28.4 mg/dL (ref 8–32)

## 2023-04-02 LAB — HEMOGLOBIN A1C
ESTIMATED AVERAGE GLUCOSE: 88 mg/dL
HEMOGLOBIN A1C: 4.7 % — ABNORMAL LOW (ref 4.8–5.6)

## 2023-04-02 MED ORDER — LISINOPRIL 10 MG TABLET
ORAL_TABLET | Freq: Every day | ORAL | 2 refills | 30 days | Status: CP
Start: 2023-04-02 — End: 2023-07-01

## 2023-04-02 MED ORDER — ESCITALOPRAM 10 MG TABLET
ORAL_TABLET | Freq: Every day | ORAL | 2 refills | 30 days | Status: CP
Start: 2023-04-02 — End: 2023-07-01

## 2023-04-03 LAB — HIV ANTIGEN/ANTIBODY COMBO: HIV ANTIGEN/ANTIBODY COMBO: NONREACTIVE

## 2023-04-04 LAB — HEPATITIS C RNA, QUANTITATIVE, PCR
HCV RNA LOG10: 6.05 {Log_IU}/mL — ABNORMAL HIGH (ref <0.00)
HCV RNA(IU): 1132636 [IU]/mL — ABNORMAL HIGH (ref <=0)
HCV RNA: DETECTED — AB

## 2023-04-04 NOTE — Unmapped (Signed)
Called patient to reschedule but the patient did not answer. I left a voicemail requesting a call back.

## 2023-04-05 DIAGNOSIS — G4700 Insomnia, unspecified: Secondary | ICD-10-CM | POA: Insufficient documentation

## 2023-04-05 LAB — TSH: THYROID STIMULATING HORMONE: 0.783 u[IU]/mL (ref 0.550–4.780)

## 2023-04-05 NOTE — Unmapped (Signed)
REFERRAL RECEIVED:  04/02/2023 EPIC              DIAGNOSIS:    RECURRENT BREAST ABSCESS- LEFT                 REASON FOR REFERRAL: CLINIC FOLLOW-UP    Patient Informed of possible charges associated with pathology? YES     Patient Informed of possible charges associated with imaging? YES    Patient contacted with appointment details. Patient clinic visit scheduled.

## 2023-04-07 LAB — HEPATITIS B SURFACE ANTIBODY
HEPATITIS B SURFACE ANTIBODY QUANT: 76.29 m[IU]/mL — ABNORMAL HIGH (ref <8.00)
HEPATITIS B SURFACE ANTIBODY: REACTIVE — AB

## 2023-04-07 LAB — HEPATITIS B SURFACE ANTIGEN: HEPATITIS B SURFACE ANTIGEN: NONREACTIVE

## 2023-04-07 LAB — HEPATITIS B CORE ANTIBODY, TOTAL: HEPATITIS B CORE TOTAL ANTIBODY: NONREACTIVE

## 2023-04-07 LAB — SYPHILIS SCREEN: SYPHILIS RPR SCREEN: NONREACTIVE

## 2023-04-23 NOTE — Unmapped (Signed)
Patient contacted with appointment details. Patient clinic visit scheduled.

## 2023-04-23 NOTE — Unmapped (Unsigned)
Assessment and Plan:         Diagnoses and all orders for this visit:    Screening for cervical cancer    Immunization due    Primary hypertension    Anxiety    Leukocytosis, unspecified type    Polycythemia    Chronic hepatitis C without hepatic coma (CMS-HCC)            Subjective:     Lori Bowers 36 y.o.female is here for well woman exam     Last pap:  08/05/2017: NIL  History of abnormal pap smear?     How is the lexapro?       No LMP recorded.      ROS:     ROS negative unless otherwise noted in HPI    Vital Signs:     Wt Readings from Last 3 Encounters:   04/02/23 77.4 kg (170 lb 9.6 oz)   02/16/23 75.8 kg (167 lb)   05/12/22 71.6 kg (157 lb 12.8 oz)     Temp Readings from Last 3 Encounters:   04/02/23 37.1 ??C (98.8 ??F) (Temporal)   02/16/23 37.1 ??C (98.8 ??F) (Oral)   05/12/22 36.9 ??C (98.4 ??F) (Oral)     BP Readings from Last 3 Encounters:   04/02/23 175/115   02/16/23 175/115   05/12/22 141/117     Pulse Readings from Last 3 Encounters:   04/02/23 78   02/16/23 92   05/12/22 117       Objective:     General Appearance: Alert, cooperative, no distress, appears stated age.   Head and Neck: Normocephalic, atraumatic. No  masses. No thyromegaly. No bruits.   EENT: Eyes: PERRLA, Conjuntiva clear; Ears: bilateral TMs normal  Cardiovascular:  Regular rate and rhythm. No murmurs.  No pitting edema  Respiratory: Lungs clear to auscultation bilaterally,  Respiratory effort unremarkable. No wheezes or rhonchi.   Breast: Symmetrical. No masses or lumps. No nipple discharge.  No abnormal axillary nodes  Gastrointestinal: Abdomen soft and non-tender. No organomegaly or masses.  Genitourinary: Normal female external genitalia. Urethra non-tender and without masses, lesions. Vagina normal in appearance, without discharge, lesions. No significant prolapse. Cervix os identified without discharge. Uterus mobile, normal in size and non-tender. Bimanual exam revealed no masses or cervical motion tenderness. Musculoskeletal: Gait and station unremarkable. Normal ROM. Normal strength and tone.   Extremities :No edema.  Peripheral pulses normal  Integumentary: Warm and dry.No rashes.  No abnormal appearing skin lesions  LYMPH NODES: Cervical, supraclavicular, and axillary nodes normal  Neurologic: Alert and oriented to place and time. CN II-XII grossly intact.   Psychiatric: Mood and affect appropriate for situation.

## 2023-04-30 ENCOUNTER — Ambulatory Visit: Admit: 2023-04-30 | Discharge: 2023-05-01 | Payer: PRIVATE HEALTH INSURANCE

## 2023-04-30 ENCOUNTER — Ambulatory Visit
Admit: 2023-04-30 | Discharge: 2023-05-01 | Payer: PRIVATE HEALTH INSURANCE | Attending: Student in an Organized Health Care Education/Training Program | Primary: Student in an Organized Health Care Education/Training Program

## 2023-04-30 DIAGNOSIS — G4700 Insomnia, unspecified: Principal | ICD-10-CM

## 2023-04-30 DIAGNOSIS — F419 Anxiety disorder, unspecified: Principal | ICD-10-CM

## 2023-04-30 DIAGNOSIS — F430 Acute stress reaction: Principal | ICD-10-CM

## 2023-04-30 DIAGNOSIS — Z124 Encounter for screening for malignant neoplasm of cervix: Principal | ICD-10-CM

## 2023-04-30 DIAGNOSIS — I10 Essential (primary) hypertension: Principal | ICD-10-CM

## 2023-04-30 DIAGNOSIS — Z23 Encounter for immunization: Principal | ICD-10-CM

## 2023-04-30 DIAGNOSIS — Z5181 Encounter for therapeutic drug level monitoring: Principal | ICD-10-CM

## 2023-04-30 MED ORDER — LISINOPRIL 10 MG TABLET
ORAL_TABLET | Freq: Every day | ORAL | 2 refills | 30 days | Status: CP
Start: 2023-04-30 — End: 2023-04-30

## 2023-04-30 MED ORDER — LISINOPRIL 20 MG TABLET
ORAL_TABLET | Freq: Every day | ORAL | 0 refills | 90 days | Status: CP
Start: 2023-04-30 — End: 2023-07-29

## 2023-04-30 MED ORDER — AMITRIPTYLINE 10 MG TABLET
ORAL_TABLET | Freq: Every evening | ORAL | 0 refills | 30 days | Status: CP
Start: 2023-04-30 — End: 2023-05-30

## 2023-04-30 NOTE — Unmapped (Addendum)
To schedule Ultrasound of liver, please call: [phone]     To schedule lung function scheduling, please call: [phone]     To schedule with GI, please call: [phone]  --------------------------------------------------------------------------------------------------------------------------------  Suicide & Crisis Hotlines     Suicide and Crisis Lifeline  Dial: 988    The Hopeline   Phone: (409)073-1813    Online chat: www.hopeline.com    [employer]: 1-800-SUICIDE (782-090-7215)    Crisis Text Line:  Text:  HOME to 306-302-1949    National Suicide Prevention Lifeline    Phone: 1-800-273-TALK (613 360 8750)   Online chat: www.suicidepreventionlifeline.org    Online chat (ages 63-24): https://gibson.com/        How to Find Mental Health Treatment   If you have health insurance, look on your insurance card for the phone number or website to access a list of mental health providers who accept your insurance.      If you have Medicaid, Medicare or no insurance, see below for your county's LME/MCO. Call the phone number to schedule an appointment. There are several local mental health agencies listed below by county also.     Mobile Crisis   Crisis situations are often best resolved at home. Mobile Crisis Teams are available 24 hours a day in all counties. Professional counselors will speak with you and your family during a visit. They have an average response time of 2 hours.           [facility]  Website: www.[email or web address]  [address]  [place], [place] [address]  Phone: [phone]  Fax: [phone]  Crisis Line: [phone]  Counties Served: [place], [place], [place], [place], [place], [facility]        [place]---   [facility]    Address: [address] [place], [place]    Phone: [phone]   Hours: M-W, Th-F 8am-3pm; Saturday and Sunday 8am-4pm   [phone] for 24/7       [facility]---Therapeutic Alternatives  24/7 Mobile Crisis: [phone]    [facility] Recovery Response Center    Address: [address], [place]   Phone: [phone]    Hours: 24 hours a day    [employer]  24/7 Mobile Crisis: [phone]    [facility], [facility] Health Department   Address: [address] [place]. [place], [place] [address]   Phone: [phone]   Hours: M-F 8am-5pm    [place]---Crisis ED  [address] [place], [place] [address]  Phone: [phone]    [facility] [place] of [place]  [facility]  [id number] [place]  [place], [place] [address]  Phone: [phone]     Kahi Mohala Alternatives  24/7 Mobile Crisis: [phone]    [facility]  [address] [place], [place]  Phone: [phone]  Hours: M-F 8am-5pm     [place]--RHA  [address], [place]   Phone: [phone]    [facility]  [address] [place], [place]  Phone: [phone]    Leonie Douglas  24/7 Mobile Crisis: [phone]    [facility]   [address] Kendell B[place], [place]   Phone: [phone] 24/7 Mobile Crisis: [phone] or [phone]  Hours: 24 hours    [facility]   Address: [address] [place], [place]   Phone: [phone]   Hours: 24 hours  [place]---Therapeutic Alternatives  24/7 Mobile Crisis: [phone]             [facility] Resources Office  Website: www.[email or web address]  [address]  [place], [place] [address]  Phone: [phone]  Crisis Line: [phone]  [place]: [place], [place], [place], [place], [place], [place], [place], [place], [place], [place], [place], [place], [place], [place], [place], [place], [place], [place], [place], [place], [place], [place], [place], [place], [place], [place], [place], [facility]---DREAM  [address] [place], [place]  Phone: [phone]  Hours: M-F 8am-8pm    [facility] County---Integrated Family Services  24/7 Mobile Crisis: [phone]    [facility]  [address]  [place], [place] [address]  [phone]    [facility]  [address]  [place], [place] [address]  [phone]    [facility]  [phone]    [facility] [place]---Integrated Family Services  24/7 Mobile Crisis: [phone]    [facility] County---CommWell Health of [place]  [phone] [place] [address]  Phone: [phone]     [place]---RHA  [address] [place], [place]  Phone: [phone] (Also Mobile Crisis)  Hours: M-F 8am-5pm    [place]--Helping Hand of [place]  [address] [place],  [place], [place]   Phone: [phone]   Hours: M-F 8-5    [facility]  [address] [place], [place]  Phone: [phone]  Hours: M-F, 8am to 3pm    [facility] [place]--[place] [employer]  [address], [place], [place]  Phone: [phone]  Hours: M, T, Th, F 8am to 3pm    [facility] Crisis  [phone]    [facility] [place]---Integrated Family Services  24/7 Mobile Crisis: [phone]    [facility]  [address]  [place], [place] [address]  [phone]    [place]---RHA  [address] [place], [place]  Phone: [phone] / 24/7 Mobile Crisis: [phone]  Hours: M-F 8am-5pm    Wandra Arthurs [phone]    [facility]---Integrated Family Services  24/7 Mobile Crisis: [phone]    [facility]--Integrated Family Services   24/7 Mobile Crisis: [phone]    [facility]  [address], [place], [place]  Phone: [phone] / 24/7 Mobile Crisis: [phone]  Hours: M-F [place]--- CALL [phone] for mobile crisis    [place]---PORT  [address] [place], [place]   Phone: [phone]  Hours: M-F 8am-5pm    [facility]  24/7 Mobile Crisis: [phone]    [place] - Vernetta Honey [phone]    [place]---Integrated Family Services  24/7 Mobile Crisis: [phone]    [place]---PORT  [facility]  [address]  [place], [place] [address]  [phone]    [place]---Integrated Family Services  24/7 Mobile Crisis: [phone]    Jasmine Awe [phone]    [facility] [place]---Integrated Family Services  24/7 Mobile Crisis: [phone]    [facility]---RHA   [address] [place], [place]  Phone: [phone]  Hours: T, W, F 8:30am to [employer] [facility] Recovery Services  24/7 Mobile Crisis: [phone]    Hertford Demetrio Lapping [phone]    [facility] - PORT  [facility]  [address]  [place], [place] [address]  [phone]    [facility]  [address]  [place], [place] [address]  [phone]    [facility] County--Integrated Family Services  24/7 Mobile Crisis: [phone]     Sammuel Hines [phone]    [facility] County---Integrated Family Services  24/7 Mobile Crisis: [phone]    Glee Arvin [phone]    [facility]  24/7 Mobile Crisis: [phone]    Karna Christmas [phone]    [facility] [place]---Integrated Family Services  24/7 Mobile Crisis: [phone]        Burt Ek  [address] [place], [place]  Phone: [phone]  Hours: M-F 8am to 1pm    [facility] Family Services  24/7 Mobile Crisis Phone: [phone]    Peyton Najjar [facility] UCP  24/7 Mobile Crisis Phone: [phone]    [facility] [place]--Helping Hands of [place]  [address], [place]  [phone]  M-F 8-5    [facility] - Pride of [facility]  [address],  [place], [place] [address]  Phone ([address]): [phone]  Phone (Suite [address]): [phone]     [facility] Crisis  [phone]    Temple Pacini [phone]     [facility]---Integrated Family Services  24/7 Mobile Crisis: [phone]    [facility]  [address] [place], [place]   Phone: [phone]    Janina Mayo [facility]  [address]  [place], [place] [address]  Phone: [phone]    [facility] Counseling  [address]  [phone]    [facility] Crisis  [phone]    Lauree Chandler [phone]    [facility]  24/7 Mobile Crisis: [phone]        [place]---PORT  [facility]  [address]  [place], [place] [address]  [phone]    [facility] of [place]  Healthcare Partner [facility]  [address] [place], [place] [address]  Phone: [phone]     [place]---Integrated Family Services  Phone: [phone]    [facility]  [address] [place], [place]  Phone: [phone]  Hours: M-F 8am to 5pm    [facility] County--RHA   24/7 Mobile Crisis   [phone]    [facility] County---Integrated Family Services  24/7 Mobile Crisis: [phone]    [facility] - PORT Health (Mental Health and Substance Use)  [facility]  [address]  [place], [place] [address]  [phone]    [facility]  [address]  [place], [place] [address]  [phone]    [facility]  [address]  [place], [place] [address]  [phone]    [facility] - Pride of [facility]  [address]  [place], [place] [address]  Phone: [phone]     [place]---Integrated Family Services OPEN ACCESS CLINIC   Monday 12 pm - 5 pm   Ferry location only Tuesday - Friday 8:00 am - 12:00 pm   Phone [phone]     [facility] [place]---Integrated Family Services  24/7 Mobile Crisis: [phone]    Jan Fireman  [phone]    [place]---Integrated Family Services  24/7 Mobile Crisis: [phone] Vernetta Honey [phone]    [facility] [place]---Integrated Family Services  24/7 Mobile Crisis: [phone]  [facility]  Website: [email or web address]  [address]  [place], [place] [address]  Phone: [phone]  Fax: [phone]  Crisis Line: [phone]  Counties Served: [place], [place], [place]a [place], [place], [place], [place], [place], Laretta Bolster       [facility] [place]---New Dimension Group-- [facility]  Phone: [phone]  Monday - Friday: 8:00 - 5:00 pm.  Saturday: Closed except by appointment  Sunday: Closed    [place]/ [place]---Easter Seals UCP  24/7 Mobile Crisis: [phone]     Melina Copa  [address] [place], [place]  Phone: [phone]   Hours: M-F 8am-3pm    [place]---PORT Health Services - [facility] (Mental Health and Substance Use)  [address], [place], [place]  Phone: [phone]  Hours: M-F 8am-5pm    [facility]  [address] [place], [place]  Phone: [phone]   Hours: M-F 8am-3pm (walk-ins welcome)    [facility] County--RHA Behavioral  [address] [place], [place]   Phone: [phone]  Hours: M, W, F 8am to 11am        [facility] County---CommWell Health Building [facility]  [address], [place], [place] [address]  [facility] States [address]  Phone:[phone]    [place]---[facility]  [address] [place], [place]  Phone: [phone]  Hours: M-F 8am-3pm    [place]---[facility]  [address], [place]  Phone: [phone]    [facility]  [address] [place], [place]  Phone: [phone]  Hours: M-TH 8am-8pm ; F- 8-6.    Andrey Campanile [facility]  [address] [place], [place]  Phone: [phone]  Hours: M-F 8am-3pm    [facility] Pride of [place]  [facility]  [address]  [place], [place] [address]  [phone]          [facility] Office  Website: [email or web address]  [address]  [place], [place] [address]  Phone: [phone]  Fax: [phone]  Crisis Line: [phone]  Counties Served: Cathie Beams, [place], [employer], [place], [place], [place], [place], [place], [place], [place], Ena Dawley, Maia Petties, Rocky Link [facility]  [address] [place], [place] [address]  Phone: [phone] / 24/7 Mobile Crisis: [phone]  Hours: M-F 9am-5pm      [facility] Recovery Services   [address]urell [address] [place], [place]  Phone:[phone]    24/7 Mobile Crisis: [phone]    Suicide Prevention Hotline: 6408583749  [facility]  [address] [place], [place]  Phone: [phone]   24/7 Mobile Crisis: [phone]  Hours: M-F 9am-3pm    [place]---[facility]  [address]  [place], [place]  Phone: [phone]  Hours: M-F 8am-3pm    [place]---[facility] Counseling and Crisis Centers  [address], [place]  Phone: [phone]  24/7 Mobile Crisis: [phone]    [facility] Recovery Services  [address] [place], [place]  Phone: [phone]  Margorie John, F 8am to 12pm    [facility] Recovery Services  24/7 Mobile Crisis: [phone]    [facility]  [address], [place], [place]  Phone: [phone]   24/7 Mobile Crisis: [phone]    Suicide Prevention Hotline: 725-810-8805  Hours: M-F 8am-5pm    Jamison Oka  [address] [place], [place]  Phone: [phone]  Hours: M-F 8am-3pm  [facility] County---[place] Counseling and Crisis Centers  [address], [place]  Phone: [phone]  24/7 Mobile Crisis: [phone]    [facility] Recovery Services  [address]  [place], [place] [address]  Phone: [phone]   Mobile Crisis: [phone]    Suicide Prevention Hotline: 860-701-8656    Lowella Bandy  [address] [place], [place]  Phone: [phone]  Hours: M-F 8am-3pm    Francesco Sor [place]---[place]hoenix Counseling and Crisis Centers  24/7 Mobile Crisis: [phone]    [place] [facility] Preservation Services  [address] [place], [place]  Phone: [phone]  Hours: M-F 9a-5p    [facility] Recovery Services  [address] [place], [place]  Phone: [phone]   Mobile Crisis: [phone]    Suicide Prevention Hotline: 256-568-9196    [facility] Recovery Services  [address]  [place]. [place], [place] [address]  Phone: [phone]   Mobile Crisis: [phone]    Suicide Prevention Hotline: 918-037-6454    [facility] Recovery Services  [address] [place]. [place], [place]   Phone: [phone]   Mobile Crisis: [phone]    Suicide Prevention Hotline: 804-118-4055    [facility] Recovery Services  [address] [place], [place]  Phone: [phone]   24/7 Mobile Crisis: [phone]    Suicide Prevention Hotline: (518)216-3279          [facility] Office  Website: [email or web address]  [address]  [place], [place] [address]  Phone: [phone]  Fax: [phone]  Crisis Line: 4638823832  [facility]: Thomasenia Sales, [place], [place], [place], Clare Charon, [place]        [facility] - [facility] Recovery Services  [address] [place], [place]   Phone: [phone]   Mobile Crisis: [phone]   Suicide Prevention Hotline: (207)328-4178    Neomia Glass Alternatives (24/7 Mobile Crisis)  [phone]        [facility] Recovery Services  [address] [place]  Phone: [phone]   24/7 Mobile Crisis: [phone]  Mobile Crisis: [phone]    Suicide Prevention Hotline: (671)496-0130  Hours: M-F 8am-8pm    [place]---[facility]  [address] [place], [place]   Phone: [phone]  Hours: M-F 8am-3pm    [place]--Family Service of the [place]  [address] [place], [place]  Phone: [phone]  Hours: M-F 8am to 3pm    [place]--RHA  [address] [place], [place]  Phone: [phone]  Hours: M-F 8am to 3pm    [place]---Therapeutic Alternatives  24/7 Mobile Crisis: [phone]    [facility] Recovery Services  [address] [place], [place]  Phone: [phone]  Mobile Crisis: [phone]    Suicide Prevention Hotline: 239-190-4715  Hours: M-F 8am-3pm    [facility] County---Therapeutic Alternatives  24/7 Mobile Crisis: [phone]    [facility] Recovery Services  [address] [place], [place]  Phone: [phone]  Mobile Crisis: [phone]    Suicide Prevention Hotline: (731)055-9122  Hours: M-F 8am-3pm    [facility] County---Therapeutic Alternatives  24/7 Mobile Crisis: [phone]    [facility] Recovery Services  [address] [place], [place]  Phone: [phone]  Mobile Crisis: [phone]    Suicide Prevention Hotline: (262)550-9947  Hours: M-F 8am-3pm    Coralyn Mark Alternatives  24/7 Mobile Crisis: [phone]    [facility] Recovery Services  [address] [place], [place]  Phone: [phone]  Mobile Crisis: [phone]    Suicide Prevention Hotline: 4806199739  Hours:  M-F 8am-3pm    [place]---Therapeutic Alternatives  24/7 Mobile Crisis: [phone]    [facility] Recovery Services  [address] [place], [place]  Phone: [phone]  Mobile Crisis: [phone]   Suicide Prevention Hotline: 3525604910  Hours: M-F 8am-3pm    [facility] County---Therapeutic Alternatives  24/7 Mobile Crisis: [phone]    [facility] Recovery Services  [address] [place], [place]  Phone: [phone]  Mobile Crisis: [phone]    Suicide Prevention Hotline: 434 170 9021  Hours: M-F 8am-3pm    [facility] Recovery Services  [address] [place], [place]  Phone: [phone]  Mobile Crisis: [phone]    Suicide Prevention Hotline: (928) 382-1505  Hours: M-F 8am to 3pm    [facility] County---Therapeutic Alternatives  24/7 Mobile Crisis: [phone]    [facility] Recovery Services  [address] [place] [address],  [place], [place]  Phone: [phone]  Mobile Crisis: [phone]    Suicide Prevention Hotline: 801 618 8191  Hours: M-F 8am-3pm    [place]---Therapeutic Alternatives  24/7 Mobile Crisis: [phone]    [facility] Recovery Services  [address] [place] [address] [place], [place]  Phone: [phone] / 24/7 Mobile Crisis: [phone]  Hours: M-F 8am-3pm            [facility] Recovery Services  [address], [place], [place] [address]  Phone: [phone]  Mobile Crisis: [phone]    Suicide Prevention Hotline: 325-344-0050          [facility] Health  Website: www.[email or web address]  [address]  [place], [place] [address]  Phone: [phone]  Fax: [phone]  Crisis Line: [phone]  [place]: [place], Lyn Hollingshead, Scientist, physiological, [place], [place], [place], [place], [place], [place], [place], [place], [place], [place], [place], [place], [place], [place], [place], [place], [place], [place], [place], [place], [place], [place]        [place]---RHA  [address] [place], [place]  Phone: [phone]  Hours: M,W,F 8am to 3pm and Walk-in crisis center 7 days a week 8am-midnight.     Runner, broadcasting/film/video (24/7 Mobile crisis)  [phone]    [facility]  [address] [place],  [place], [place]  Phone: [phone]   24/7 Mobile Crisis: [phone]  Hours: M-F 8am-5pm    [place] [facility] Recovery Services  [address] [place], [place]  Phone: [phone]   24/7 Mobile Crisis: [phone]   Mobile Crisis: [phone]    Suicide Prevention Hotline: 623-281-5645  Hours: M-F 8am-5pm    [facility] Recovery Services  [address] [place], [place]  Phone: [phone]   Mobile Crisis 24/7: [phone]    Suicide Prevention Hotline: 8254755822  Hours: M-F 8am-5pm    Denny Peon [facility] Recovery Services  [address], [place]  Phone: [phone]   Mobile Crisis 24/7: [phone]   Suicide Prevention Hotline: (231)466-5930  [facility] Preservation Services  [address], [place], [place]  Phone: [phone]  Hours: M-F 8am-5pm    [facility]  [address] [place], [place]  Phone: [phone]  Hours: M-F 8:30am to 5:30pm  Crisis: [phone]    [facility]  [address], [place], [place]  Phone: [phone]   24/7 Mobile Crisis: [phone]  Hours: M-F 8am-5pm    [place]---Psychotherapeutic Services  24/7 Mobile Crisis: [phone]    [facility] Recovery Services  [address] [place], [place]   Phone: [phone],   Mobile Crisis: [phone]    Suicide Prevention Hotline: 9077552247    [facility] County---Therapeutic Alternatives  24/7 Mobile Crisis: [phone]    [facility] County---NCG/Appalachian Community Services  [address] [place], [place]  Phone: [phone]   24/7 Mobile Crisis: [phone]  Hours: M-F 9am-5pm    [facility]  [address] [place], [place]  Phone: [phone]   24/7 Mobile Crisis: [phone]  Hours: M-F 9am-5pm    [place]---[facility]  [address], [place], [place]  Phone: [phone]    [facility] Recovery Services  24/7 Mobile Crisis: [phone]    [facility]  [address] [place], [place]  Phone: [phone]   24/7 Mobile Crisis: [phone]  Hours: M-F 9am-5pm    [place]: [phone]  No John [address] [facility] Recovery Services  24/7 Mobile Crisis: [phone]    [facility] County---NCG/Appal[facility]ian [facility]  [address] [place], [place]  Phone: [phone]   24/7 Mobile Crisis: [phone]  Hours: M-F 9am-5pm    [facility] ( adults only)  [address] [place], [place]  Phone: [phone]  Hours: 8:30am to 5pm    [facility] (Child and Family only)   [address], [place]   [phone]    [facility]  [address] [place], [place]  Phone: [phone]  Hours: M-F 9am-1pm    [facility]  24/7 Mobile Crisis: [phone]    [facility]  [address], [place], [place]  Phone: [phone]  Hours: M-F 9am-5pm    [facility]  24/7 Mobile Crisis: [phone]    [facility]  [address], [place], [place]  Phone: [phone]   24/7 Mobile Crisis: [phone]  Hours: M-F 9am-5pm    [facility]  [address], [place], [place]  Phone: [phone]  Hours: M-F 9a-5p    [place]  24/7 Mobile Crisis: [phone]  Hours: M-F 8am-5pm    [facility]  [address] [place], [place]   Phone: [phone]  24/7 Mobile Crisis: [phone]  Hours: M-F 8am-5pm    [facility]  [address] [place], [place]  Phone: [phone]  24/7 Mobile Crisis: [phone]    [facility] [place]--- CALL [phone] (No Walk-in)  24/7 Mobile Crisis: [phone]    [facility]  [address] [place], [place]   Phone: [phone]  Hours: M-F 9am-5pm    [facility]  24/7 Mobile Crisis: [phone]    [facility]  [address] [place], [place]  Phone: [phone]   24/7 Mobile Crisis: [phone]    Suicide Prevention Hotline: 586-184-0306  Hours: M-F 8am-8pm    [place]  [address] [place], [place]  Phone: [phone]  Hours: M-F 9a-5p    [place] [facility]  24/7 Mobile Crisis: [phone]    [facility]  [address] [place], [place]  Phone: [phone]  Hours: Margorie John 11:30am to 1:30, F 8am to 12pm  Mobile Crisis: [phone]    Suicide Prevention Hotline: 984-544-9264    [facility]  [address] [place], [place]  Phone: [phone]  24/7 Mobile Crisis: [phone]  Hours: M-F 9am-5pm    [facility]  [address], [place]  Phone: [phone]  Hours: M-F 9a to 5p    [place] [employer]  24/7 Mobile Crisis: [phone]            [facility]  [address] [place], [place]  Phone: [phone]  Mobile Crisis: [phone]    Suicide Prevention Hotline: (540)058-8687    [facility]  [address] [place], [place]  Phone: [phone]    [facility]  [address], [place], [place]  Phone: [phone]  24/7 Mobile Crisis: [phone]    Suicide Prevention Hotline: 407-648-3445    [facility]  [address] [place], [place]  Phone: [phone]  24/7 Mobile Crisis: [phone]    Suicide Prevention Hotline: 2084012416     [facility]  [address] [place], [place]  Phone: [phone]  24/7 Mobile Crisis: [phone]  Hours: M-F 9a-5p
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
## 2023-05-08 ENCOUNTER — Ambulatory Visit
Admit: 2023-05-08 | Discharge: 2023-05-09 | Payer: PRIVATE HEALTH INSURANCE | Attending: Hematology & Oncology | Primary: Hematology & Oncology

## 2023-05-08 ENCOUNTER — Ambulatory Visit
Admit: 2023-05-08 | Discharge: 2023-05-09 | Payer: PRIVATE HEALTH INSURANCE | Attending: Student in an Organized Health Care Education/Training Program | Primary: Student in an Organized Health Care Education/Training Program
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
## 2023-05-09 ENCOUNTER — Ambulatory Visit: Admit: 2023-05-09 | Discharge: 2023-05-10 | Payer: PRIVATE HEALTH INSURANCE

## 2023-05-09 NOTE — Unmapped (Signed)
Patient is requesting for the Nurse to contact them in regards to Xray that she is supposed to get today. She was told by Dr. Haze Rushing to go to the [facility] facility near [place]. Patient isn't sure if she can just walk in there or if she needs an appointment. Patient also isn't sure exactly which facility that is.  Please contact Patient by Cell Phone

## 2023-05-13 MED ORDER — METHOCARBAMOL 750 MG TABLET
ORAL | 0 refills | 0 days
Start: 2023-05-13 — End: ?

## 2023-05-14 MED ORDER — METHOCARBAMOL 750 MG TABLET
ORAL | 0 refills | 0 days
Start: 2023-05-14 — End: ?

## 2023-05-14 NOTE — Unmapped (Signed)
SHOULD NO LONGER BE TAKING

## 2023-05-14 NOTE — Unmapped (Signed)
Patient is requesting the following refill  Requested Prescriptions     Pending Prescriptions Disp Refills    methocarbamol (ROBAXIN) 750 MG tablet  0     Sig: Take 1 tablet (750 mg total) by mouth.       Recent Visits  Date Type Provider Dept   05/08/23 Office Visit Tessie Eke, MD [facility] [address] [facility]   04/30/23 Office Visit Mangel, Benison Pap, DO [facility] [address] [facility]   04/02/23 Office Visit Mangel, Benison Pap, DO [facility] [address] [facility]   Showing recent visits within past 365 days with a meds authorizing provider and meeting all other requirements  Future Appointments  Date Type Provider Dept   05/30/23 Appointment Mangel, Benison Pap, DO [facility] [address] [facility]   Showing future appointments within next 365 days with a meds authorizing provider and meeting all other requirements       Labs: Not applicable this refill

## 2023-05-17 NOTE — Unmapped (Signed)
I called Lori Bowers in regards to missed breast clinic appointment. I left her a message for a return call back.

## 2023-05-28 DIAGNOSIS — F419 Anxiety disorder, unspecified: Principal | ICD-10-CM

## 2023-05-28 DIAGNOSIS — G4700 Insomnia, unspecified: Principal | ICD-10-CM

## 2023-05-28 MED ORDER — AMITRIPTYLINE 10 MG TABLET
ORAL_TABLET | Freq: Every evening | ORAL | 0 refills | 90 days | Status: CP
Start: 2023-05-28 — End: 2023-08-26

## 2023-06-03 ENCOUNTER — Ambulatory Visit
Admit: 2023-06-03 | Discharge: 2023-06-03 | Payer: PRIVATE HEALTH INSURANCE | Attending: Student in an Organized Health Care Education/Training Program | Primary: Student in an Organized Health Care Education/Training Program

## 2023-06-03 ENCOUNTER — Ambulatory Visit: Admit: 2023-06-03 | Discharge: 2023-06-03 | Payer: PRIVATE HEALTH INSURANCE | Attending: Family | Primary: Family

## 2023-06-03 MED ORDER — PROPRANOLOL 20 MG TABLET
ORAL_TABLET | 0 refills | 0 days | Status: CP
Start: 2023-06-03 — End: ?

## 2023-06-18 ENCOUNTER — Ambulatory Visit
Admit: 2023-06-18 | Discharge: 2023-06-19 | Payer: PRIVATE HEALTH INSURANCE | Attending: Student in an Organized Health Care Education/Training Program | Primary: Student in an Organized Health Care Education/Training Program

## 2023-06-18 DIAGNOSIS — F419 Anxiety disorder, unspecified: Principal | ICD-10-CM

## 2023-06-18 DIAGNOSIS — I10 Essential (primary) hypertension: Principal | ICD-10-CM

## 2023-06-18 DIAGNOSIS — B182 Chronic viral hepatitis C: Principal | ICD-10-CM

## 2023-06-18 DIAGNOSIS — N926 Irregular menstruation, unspecified: Principal | ICD-10-CM

## 2023-06-18 LAB — CBC W/ AUTO DIFF
BASOPHILS ABSOLUTE COUNT: 0 10*9/L (ref 0.0–0.1)
BASOPHILS RELATIVE PERCENT: 0.2 %
EOSINOPHILS ABSOLUTE COUNT: 1.4 10*9/L — ABNORMAL HIGH (ref 0.0–0.5)
EOSINOPHILS RELATIVE PERCENT: 13.7 %
HEMATOCRIT: 42.2 % (ref 34.0–44.0)
HEMOGLOBIN: 14.3 g/dL (ref 11.3–14.9)
LYMPHOCYTES ABSOLUTE COUNT: 3.2 10*9/L (ref 1.1–3.6)
LYMPHOCYTES RELATIVE PERCENT: 31.1 %
MEAN CORPUSCULAR HEMOGLOBIN CONC: 33.8 g/dL (ref 32.0–36.0)
MEAN CORPUSCULAR HEMOGLOBIN: 32.1 pg (ref 25.9–32.4)
MEAN CORPUSCULAR VOLUME: 95.1 fL (ref 77.6–95.7)
MEAN PLATELET VOLUME: 9.4 fL (ref 6.8–10.7)
MONOCYTES ABSOLUTE COUNT: 0.7 10*9/L (ref 0.3–0.8)
MONOCYTES RELATIVE PERCENT: 6.8 %
NEUTROPHILS ABSOLUTE COUNT: 5 10*9/L (ref 1.8–7.8)
NEUTROPHILS RELATIVE PERCENT: 48.2 %
PLATELET COUNT: 294 10*9/L (ref 150–450)
RED BLOOD CELL COUNT: 4.44 10*12/L (ref 3.95–5.13)
RED CELL DISTRIBUTION WIDTH: 13.7 % (ref 12.2–15.2)
WBC ADJUSTED: 10.3 10*9/L (ref 3.6–11.2)

## 2023-06-18 LAB — HCG QUANTITATIVE, BLOOD: GONADOTROPIN, CHORIONIC (HCG) QUANT: <2.6 m[IU]/mL

## 2023-06-18 MED ORDER — LISINOPRIL 40 MG TABLET
ORAL_TABLET | Freq: Every day | ORAL | 11 refills | 30 days | Status: CP
Start: 2023-06-18 — End: 2024-06-17

## 2023-06-18 MED ORDER — DULOXETINE 30 MG CAPSULE,DELAYED RELEASE
ORAL_CAPSULE | Freq: Every day | ORAL | 2 refills | 30 days | Status: CP
Start: 2023-06-18 — End: 2023-09-16

## 2023-06-18 NOTE — Unmapped (Signed)
Assessment and Plan:   Diagnoses and all orders for this visit:    Menses, irregular  -     CBC w/ Differential  -     hCG, Quantitative, Blood  -     lisinopril (PRINIVIL,ZESTRIL) 40 MG tablet; Take 1 tablet (40 mg total) by mouth daily.    Hypertension, unspecified type    Chronic hepatitis C without hepatic coma (CMS-HCC)    Anxiety  -     DULoxetine (CYMBALTA) 30 MG capsule; Take 1 capsule (30 mg total) by mouth daily.  -     Ambulatory referral to [facility]; Future    Tobacco user    Left breast abscess    -----------------------------  38 you female presents today for follow up     #Hypertension  BP Readings from Last 3 Encounters:   06/18/23 160/110   06/03/23 120/91   05/08/23 152/98   -She reports compliance to her Lisinopril 20mg  every day. However her BP continues to remain above goal   -Factors contributing to her hypertension include: her anxiety and tobacco use  -Given her BP continues to remain elevated, Will increase Lisinopril to 40mg  qd      #Anxiety   -She did go to an urgent care on 06/03/23 for tachycardia and anxiety. She was started on Propranolol 20mg  BID which she has tolerated well. This however has provided minimal effects for her anxiety  -She previously tried but failed: Hydroxyzine (did not work), Zoloft (made her feel numb), Seroquel (gained weight), Buspar (did not work). We tried Amitriptyline (did not tolerate) and Lexapro (Developed excessive sweating) which she did not tolerate   -We will trial SNRI---Cymbalta 30mg  every day.   -Additionally will refer to [facility]. We discussed in depth regarding the importance of CBT.   -Discussed okay to continue Propranolol 20mg  BID    #Chronic hepatitis C  -Labs from 04/02/2023  -HCV RNA: 8,469,629  -Genotype from 02/09/21: 1a  -HIV, RPR neg  -HBcAb negative, HBsAg Negative, HBsAb positive (indicating immunizations)  -FIB-4: 0.74 points (Use alternative fibrosis assessment. Approximate fibrosis stage: Ishak 0-1 (Sterling et al 2006)  -Emphasized the importance of contacting GI to schedule appointment for Treatment of Hepatitis C. If not, then we will need to offer treatment at our clinic.     #Irregular Menses  -She appeared to have had two menstrual cycles this month.   -After further discuss however, she notes that her irregular menses is not new  -She has been sexually active with her husband. She does have IUD placed (but her IUD needs to be replaced)  -Will obtain B-hCG and CBC to assess for anemia given her frequent menses.       #HEALTH MAINTENANCE  --History of polysubstance abuse. History of IVDU as well. She has been clean since around 2019-2020  -She is still due for Pap smear and IUD exchanged. However she deferred discussing today due to acute concern regarding her blood pressure and anxiety. Her IUD was placed 08/05/2017 for Menorrhagia.   -She is a tobacco user: started in 2004, smoking 1/2 PPD-1PPD  -hx of left breast abscess: she has been referred to [facility] breast clinic multiple times but has not followed up. Emphasized importance of being evaluated    To follow up in 4-6 weeks to reassess medications as well as will need to perform pap and refer to in-house provider for IUD replacement       Subjective:     Lori Bowers 36 y.o.female is  here for follow up     SINCE LAST VISIT   -she reports that she is still anxious    -She is drinking pineapple juice and stopped drinking soda   -She got her period twice this month.   -She notes that she smokes 3-4 cigarettes a day for the last 2 weeks ago. She was previously smoking 1 ppd     She is worried about her BP which causes anxiety     She was last sexually active 2 weeks ago (with husband)   She notes what happened last time was a panic attack     She is not sure what is wrong, she will feel happy and then will get just anxious     She takes the Lisinopril 20mg  at 8pm  She notes it is hard for her to go to sleep and stay asleep     She notes that she tries to tell her children and her mother but they dont seem to understand     She feels like her insides are jittery      She notes she previously tried but failed: Hydroxyzine (did not work), Zoloft (made her feel numb), Seroquel (gained weight), Buspar (did not work), she notes lexapro and prozac didn't work     She has had this before where she will have 2 periods in a month   Last episode of this happening was in April.     Urgent care visit on 06/03/2023:   ASSESSMENT/PLAN:  Lori Bowers was seen today for irregular heart beat.     Diagnoses and all orders for this visit:     Anxiety  Sinus tachycardia  Other orders  -     propranolol (INDERAL) 20 MG tablet; Take 1/2 to 1 tablet twice/day as needed for anxiety       EKG showed ST rate 113, no Twave abn, no arrhythmia.      She was instructed to stop [facility], quit smoking, practice mindfulness/meditation.   She was given RX for propranolol 20 mg tabs #30 with no refills and will talk to her PCP about further renewals.  She may take 10-20mg  bid prn for rapid heart rate and anxiety.   She was also instructed to quit smoking.           Patient's last menstrual period was 06/18/2023.        ROS:     ROS negative unless otherwise noted in HPI    Vital Signs:     Wt Readings from Last 3 Encounters:   06/18/23 71.9 kg (158 lb 9.6 oz)   06/03/23 74.8 kg (165 lb)   05/08/23 76.2 kg (168 lb)     Temp Readings from Last 3 Encounters:   06/18/23 36.6 ??C (97.8 ??F) (Temporal)   06/03/23 36.9 ??C (98.5 ??F)   05/08/23 36.4 ??C (97.6 ??F) (Temporal)     BP Readings from Last 3 Encounters:   06/18/23 160/110   06/03/23 120/91   05/08/23 152/98     Pulse Readings from Last 3 Encounters:   06/18/23 68   06/03/23 112   05/08/23 88       Objective:     General Appearance: Alert, cooperative, in no acute distress  Head and Neck: Normocephalic, atraumatic.  EENT: EOMI, no nasal congestion.   Cardiovascular:  Regular rate and rhythm. No murmurs.  No pitting edema  Respiratory: Lungs clear to auscultation bilaterally, Respiratory effort unremarkable. No wheezes or rhonchi.   Musculoskeletal:  Gait and station unremarkable.   Extremities :No edema.   Integumentary: no rashes noted on exposed skin noted   Neurologic: AOx3. CN II-XII grossly intact.   Psychiatric: She became tearful again during the appointment as well as started to get anxious. However she is not in distress and later appeared calm.

## 2023-06-21 ENCOUNTER — Ambulatory Visit: Admit: 2023-06-21 | Discharge: 2023-06-22 | Payer: PRIVATE HEALTH INSURANCE

## 2023-06-21 DIAGNOSIS — M542 Cervicalgia: Principal | ICD-10-CM

## 2023-06-21 NOTE — Unmapped (Addendum)
Called pt c/o sore throat and swollen lymph nodes since yesterday. Since no availability in office encouraged her to seek care at [facility]. Pt agreeable

## 2023-06-21 NOTE — Unmapped (Signed)
The patient is requesting for the Provider to contact them in regards to  her having painful, swollen lymph node on left side, under jaw. Started feeling bad yesterday afternoon. It hurts to swallow and move her neck.  Please contact The patient by Cell Phone

## 2023-06-21 NOTE — Unmapped (Signed)
Name:  Lori Bowers  DOB: 1986-09-07  Date: 06/21/2023    ASSESSMENT/PLAN:  Xania was seen today for neck swelling.    Diagnoses and all orders for this visit:    Neck pain on left side      Lori Bowers is a 37 y.o. female who presents to the office with left-sided neck pain for the past couple of days.    On exam there is no visible swelling or palpable lymphadenopathy.  Patient has tenderness to palpation right on the belly of her sternocleidomastoid.  Also, the pain is provoked by neck movement and opening/closing jaw.  Feel that she has a strain of sternocleidomastoid.  We discussed rest, heat and use of Tylenol/ibuprofen as needed for pain.  Reassured that she does not appear to have a dental infection and does not need an antibiotic but if her symptoms worsen or she develops pain in her mouth would recommend recheck.    During exam patient's ears incidentally noted to be bulging with mild erythema noted to right ear.  Given that patient has absolutely no pain do not feel that antibiotic treatment is indicated however patient was encouraged to restart her allergy medications and recheck if she does develop pain.    Tobacco cessation counseling provided.    Recheck as needed.  ------------------------------------------------------------------------------    I personally spent 20 minutes face-to-face and non-face-to-face in the care of this patient, which includes all pre, intra, and post visit time on the date of service. All documented time was specific to the E/M visit and does not include any procedures that may have been performed.    Chief Complaint   Patient presents with    Neck Swelling     Left side of neck swollen and painful. Appt with oral surgeon on July 15.         HPI: Lori Bowers is a 37 y.o. female who presents to the office with pain, tenderness on the left side of her face below her jaw.     Pain began yesterday when she woke up. Since then it seems to have worsened. Swallowing makes it worse, opening her mouth also seems to make the pain worse.     She denies any fevers, chills, sweats. Does have a headache. No cough, runny nose.     Has multiple cavities and is awaiting an appointment with an oral surgeon but says that currently none of her teeth are hurting.  Also denies ear pain.  ROS:  Review of systems as above.  Rest of review of systems negative unless otherwise noted as per HPI.    I have reviewed past medical, surgical, medications, allergies, social and family histories today and updated them in Epic where appropriate.    PMH:  Past Medical History:   Diagnosis Date    Anxiety     Brain cancer (CMS-HCC)     Breast abrasion, left, sequela     Breast cancer (CMS-HCC)     Hepatitis C     Hypertension     Injury of breast, left 2014    hit breast on door        SURGICAL HX:  Past Surgical History:   Procedure Laterality Date    BREAST LUMPECTOMY      PR INCISION & DRAINAGE ABSCESS SIMPLE/SINGLE Left 11/03/2017    Procedure: INCISION & DRAINAGE OF ABSCESS; SIMPLE OR SINGLE;  Surgeon: Colon Branch, MD;  Location: [facility] OR [facility];  Service: General  Surgery    TONSILLECTOMY AND ADENOIDECTOMY      TUBAL LIGATION         MEDS:    Current Outpatient Medications:     DULoxetine (CYMBALTA) 30 MG capsule, Take 1 capsule (30 mg total) by mouth daily., Disp: 30 capsule, Rfl: 2    lisinopril (PRINIVIL,ZESTRIL) 40 MG tablet, Take 1 tablet (40 mg total) by mouth daily., Disp: 30 tablet, Rfl: 11    propranolol (INDERAL) 20 MG tablet, Take 1/2 to 1 tablet twice/day as needed for anxiety, Disp: 30 tablet, Rfl: 0    Current Facility-Administered Medications:     levonorgestrel (MIRENA) 20 mcg/24 hr (5 years) IUD 1 kit, 1 each, Intrauterine, Once, Olewinski, Lottie Rater, MD    ALL:  No Known Allergies    SH:  Social History     Tobacco Use    Smoking status: Every Day     Current packs/day: 0.50     Types: Cigarettes     Passive exposure: Current    Smokeless tobacco: Never   Vaping Use    Vaping status: Former   Substance Use Topics    Alcohol use: Yes     Comment: No alcohol use x 2 years.     Drug use: Not Currently     Types: IV, Heroin     Comment: No heroin for two years.        FH:  Family History   Problem Relation Age of Onset    Hypertension Mother     Plantar fasciitis Mother     Arthritis Mother     Cancer Father     Lung cancer Father     Hypertension Sister     Hypertension Brother     Cancer Brother     Hypertension Brother     Cancer Maternal Aunt        VITALS:  Vitals:    06/21/23 1540   BP: 122/88   Pulse: 101   Resp: 18   Temp: 36.8 ??C (98.2 ??F)   SpO2: 95%     Body mass index is 29.85 kg/m??.    Physical Exam  Constitutional:       General: She is not in acute distress.  HENT:      Head: Normocephalic and atraumatic.      Ears:      Comments: Both tympanic membranes are bulging, with the right tympanic membrane mildly erythematous.  There is no purulence noted to either tympanic membrane.     Nose: Nose normal.      Mouth/Throat:      Comments: Inspection of oral cavity is notable for numerous caries and gingivitis without visible infection.  Eyes:      Conjunctiva/sclera: Conjunctivae normal.      Pupils: Pupils are equal, round, and reactive to light.   Neck:      Comments: There is no palpable cervical lymphadenopathy.  Cardiovascular:      Rate and Rhythm: Normal rate and regular rhythm.      Heart sounds: No murmur heard.     No friction rub. No gallop.   Pulmonary:      Effort: Pulmonary effort is normal. No respiratory distress.      Breath sounds: Normal breath sounds. No stridor. No wheezing, rhonchi or rales.   Musculoskeletal:         General: Tenderness present. Normal range of motion.      Comments: There is tenderness to palpation over patient's left  sternocleidomastoid from just below the angle of her mandible all the way down to her clavicle.   Lymphadenopathy:      Cervical: No cervical adenopathy.   Skin:     General: Skin is warm and dry.   Neurological:      General: No focal deficit present.      Mental Status: She is alert and oriented to person, place, and time.      Cranial Nerves: Cranial nerves 2-12 are intact.   Psychiatric:         Mood and Affect: Mood and affect normal.         Behavior: Behavior normal.         Judgment: Judgment normal.         TEST  RESULTS:    No results found for this visit on 06/21/23.    SCREENINGS:  SDOH:   I provided an intervention for the Tobacco Use SDOH domain. The intervention was Counseling     PATIENT DISPOSITION:    Follow-up with PCP

## 2023-07-23 MED ORDER — PROPRANOLOL 20 MG TABLET
ORAL_TABLET | 0 refills | 0 days
Start: 2023-07-23 — End: ?

## 2023-07-29 MED ORDER — PROPRANOLOL 20 MG TABLET
ORAL_TABLET | Freq: Two times a day (BID) | ORAL | 0 refills | 30 days | Status: CP
Start: 2023-07-29 — End: 2023-08-28

## 2023-07-30 ENCOUNTER — Ambulatory Visit
Admit: 2023-07-30 | Discharge: 2023-07-31 | Payer: PRIVATE HEALTH INSURANCE | Attending: Student in an Organized Health Care Education/Training Program | Primary: Student in an Organized Health Care Education/Training Program

## 2023-07-30 DIAGNOSIS — I10 Essential (primary) hypertension: Principal | ICD-10-CM

## 2023-07-30 DIAGNOSIS — B182 Chronic viral hepatitis C: Principal | ICD-10-CM

## 2023-07-30 DIAGNOSIS — F419 Anxiety disorder, unspecified: Principal | ICD-10-CM

## 2023-07-30 DIAGNOSIS — Z720 Tobacco use: Principal | ICD-10-CM

## 2023-07-30 DIAGNOSIS — G4700 Insomnia, unspecified: Principal | ICD-10-CM

## 2023-07-30 DIAGNOSIS — R102 Pelvic and perineal pain: Principal | ICD-10-CM

## 2023-07-30 DIAGNOSIS — N930 Postcoital and contact bleeding: Principal | ICD-10-CM

## 2023-07-30 DIAGNOSIS — N898 Other specified noninflammatory disorders of vagina: Principal | ICD-10-CM

## 2023-07-30 DIAGNOSIS — Z124 Encounter for screening for malignant neoplasm of cervix: Principal | ICD-10-CM

## 2023-07-30 MED ORDER — DULOXETINE 30 MG CAPSULE,DELAYED RELEASE
ORAL_CAPSULE | Freq: Every day | ORAL | 2 refills | 30 days | Status: CP
Start: 2023-07-30 — End: 2023-10-28

## 2023-07-30 MED ORDER — QUETIAPINE ER 50 MG TABLET,EXTENDED RELEASE 24 HR
ORAL_TABLET | Freq: Every evening | ORAL | 2 refills | 30 days | Status: CP
Start: 2023-07-30 — End: 2023-10-28

## 2023-07-30 MED ORDER — DULOXETINE 60 MG CAPSULE,DELAYED RELEASE
ORAL_CAPSULE | Freq: Every day | ORAL | 2 refills | 30 days | Status: CP
Start: 2023-07-30 — End: 2023-07-30

## 2023-07-30 MED ORDER — PROPRANOLOL 20 MG TABLET
ORAL_TABLET | Freq: Two times a day (BID) | ORAL | 0 refills | 30 days | Status: CN
Start: 2023-07-30 — End: 2023-08-29

## 2023-07-30 NOTE — Unmapped (Signed)
PA QUETIAPINE initiated electronically via Cover My Meds

## 2023-07-30 NOTE — Unmapped (Addendum)
[facility] [place] ---[phone]) [id number]

## 2023-07-30 NOTE — Unmapped (Signed)
Assessment and Plan:   Diagnoses and all orders for this visit:    Anxiety  -     Ambulatory referral to Social Work; Future  -     Discontinue: DULoxetine (CYMBALTA) 60 MG capsule; Take 1 capsule (60 mg total) by mouth daily.  -     DULoxetine (CYMBALTA) 30 MG capsule; Take 1 capsule (30 mg total) by mouth daily.    Cervical cancer screening  -     Pap Smear; Future    Vaginal discharge  -     Wet Prep, Genital    Primary hypertension    Chronic hepatitis C without hepatic coma (CMS-HCC)    Tobacco user    Insomnia, unspecified type  -     QUEtiapine (SEROQUEL XR) 50 mg extended released tablet; Take 1 tablet (50 mg total) by mouth nightly.    Pelvic pain in female  -     [place] Endovaginal (Non-OB); Future    Bleeding after intercourse    -----------------------  37 yo female presents today for follow up     #Hypertension  BP Readings from Last 3 Encounters:   07/30/23 140/98   06/21/23 122/88   06/18/23 160/110   -She reports daily compliance to Lisinopril 40mg  every day (which she takes at night). Her BP continues to improve with increased dose of Lisinopril. Her BP at home additionally has improved   -Factors contributing to hypertension: anxiety, tobacco use  -We will continue Lisinopril 40mg  every day for now. However low threshold to add another medication (such as hydrochlorothiazide) to maintain BP goal of <140/90    #Anxiety, insomnia   -Previously tried: Hydroxyzine (did not work), Zoloft (made her feel numb), Seroquel (gained weight), Buspar (did not work), Amitriptyline (did not tolerate) and Lexapro (Developed excessive sweating)  -She was started on Cymbalta on 06/17/2024 which she reports daily compliance too. She has had some positive effect of Cymbalta, however her anxiety is still quite severe. Additionally she notes worsening Insomnia  -We will continue Cymbalta 30mg  every day for now and restart Seroquel 50mg  at bedtime as she previously tolerated this. Additionally to continue Propranolol 20g BID #Chronic Hepatitis C  -Labs from 04/02/2023  -HCV RNA: 3,[phone],643  -Genotype from 02/09/21: 1a  -HIV, RPR neg  -HBcAb negative, HBsAg Negative, HBsAb positive (indicating immunizations)  -FIB-4: 0.74 points (Use alternative fibrosis assessment. Approximate fibrosis stage: Ishak 0-1 (Sterling et al 2006)  -Phone number to [facility] provided to patient today, discussed importance of contacting and establishing care with GI for treatment     #Pelvic pain and bleeding after intercourse   -Today she did report that she does get bleeding after intercourse. Additionally she endorsed intermittent pelvic pain  -No masses or CMT was present on GU exam. However given her reported symptoms, we will obtain Pelvic Uultrasound.     HEALTH MAINTENANCE  -pap smear and wet prep performed today   -History of polysubstance abuse. History of IVDU as well. She has been clean since around 2019-2020   -She is a tobacco user: started in 2004, smoking 1/2 PPD-1PPD   -IUD placed on 08/05/2017 for Menorrhagia. We discussed in depth regarding removing and possible replacement of IUD. She is interested in at last removing her IUD. Will refer to Cleon Dew, DNP for initial consultation for removal and possible replacement of IUD  -hx of left breast abscess: she has been referred to [facility] breast clinic multiple times but has not followed up. Emphasized importance of being  evaluated     She is to follow up with me in 6 weeks to reassess her medications and anxiety.       Subjective:     Lori Bowers 37 y.o.female  is here for follow up     She reports compliance to Cymbalta and Lisinopril at night  She is still having some anxiety and trying to meditate more     She notes that her DP as been low-mid 90s, with SBP of 115 to <150    She will go September 9th to get her teeth done.     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 She is up at 0200 until 0700 and then goes to work   She can not take trazodone as causes restless leg   She is wanting to restart seroquel for sleep     Patient's last menstrual period was 07/16/2023 (approximate).        ROS:     ROS negative unless otherwise noted in HPI    Vital Signs:     Wt Readings from Last 3 Encounters:   07/30/23 75.3 kg (166 lb 1.6 oz)   06/21/23 71.7 kg (158 lb)   06/18/23 71.9 kg (158 lb 9.6 oz)     Temp Readings from Last 3 Encounters:   07/30/23 37.1 ??C (98.7 ??F) (Temporal)   06/21/23 36.8 ??C (98.2 ??F)   06/18/23 36.6 ??C (97.8 ??F) (Temporal)     BP Readings from Last 3 Encounters:   07/30/23 140/98   06/21/23 122/88   06/18/23 160/110     Pulse Readings from Last 3 Encounters:   07/30/23 84   06/21/23 101   06/18/23 68       Objective:     General Appearance: Alert, cooperative, in no acute distress  Head and Neck: Normocephalic, atraumatic.   EENT: EOMI, no nasal congestion. Teeth absent on maxilla but present on mandible  Respiratory: Respiratory effort unremarkable. No cough or audible wheeze  Breast: Deferred   Gastrointestinal: Abdomen soft and non-tender.   Genitourinary: Chaperone present--Normal female external genitalia. Urethra non-tender and without masses, lesions. Vagina normal in appearance, without discharge, lesions. No significant prolapse. Cervix os identified with small amount of white discharge. She appeared to additionally have small dried blood clot attached to IUD string. Pap smear was performed and bleeding was noted after. Bimanual exam did not reveal masses or cervical motion tenderness  Musculoskeletal: Gait and station unremarkable. Normal ROM.   Extremities :No edema.  Integumentary: Warm and dry.  Neurologic: Alert and oriented to place and time. CN II-XII grossly intact.   Psychiatric: pleasant, does not appear anxious today or in distress

## 2023-07-31 DIAGNOSIS — N760 Acute vaginitis: Principal | ICD-10-CM

## 2023-07-31 DIAGNOSIS — B9689 Other specified bacterial agents as the cause of diseases classified elsewhere: Principal | ICD-10-CM

## 2023-07-31 MED ORDER — METRONIDAZOLE 500 MG TABLET
ORAL_TABLET | Freq: Two times a day (BID) | ORAL | 0 refills | 7 days | Status: CP
Start: 2023-07-31 — End: 2023-08-07

## 2023-07-31 NOTE — Unmapped (Signed)
PA QUETIAPINE APPROVED    [employer] [place] has reviewed the request for Quetiapine Tab 50mg  Er submitted by [employer] on behalf of Lori Bowers on 07/30/2023. After review, the request for service is: Approved through 07/29/2024.

## 2023-08-02 ENCOUNTER — Ambulatory Visit: Admit: 2023-08-02 | Discharge: 2023-08-03 | Payer: PRIVATE HEALTH INSURANCE

## 2023-08-02 DIAGNOSIS — R87611 Atypical squamous cells cannot exclude high grade squamous intraepithelial lesion on cytologic smear of cervix (ASC-H): Principal | ICD-10-CM

## 2023-08-02 DIAGNOSIS — R87618 Other abnormal cytological findings on specimens from cervix uteri: Principal | ICD-10-CM

## 2023-08-05 ENCOUNTER — Telehealth: Admit: 2023-08-05 | Discharge: 2023-08-06 | Payer: PRIVATE HEALTH INSURANCE | Attending: Clinical | Primary: Clinical

## 2023-08-05 DIAGNOSIS — F419 Anxiety disorder, unspecified: Principal | ICD-10-CM

## 2023-08-05 NOTE — Unmapped (Signed)
Thank you for attending your visit today.  Please contact me if needed before our next session        Jacquelynn Cree, DSW, LCSW, LCAS - Care Manager   Virtual Embedded Care Management    P: [phone]     Suicide & Crisis Hotlines       60 Suicide and Crisis Lifeline  Call or text 988  Online Chat: www.988lifeline.org    The Hopeline   Phone: 3178483204    Online chat: www.hopeline.com    [place]   Phone: 1-800-SUICIDE (272-365-5642)    National Suicide Prevention Lifeline    Phone: 7-782-423-NTIR (415 488 7812)   Online chat: www.suicidepreventionlifeline.org    Online chat (ages 2-24): https://gibson.com/        If you are experiencing a psychiatric emergency, you can call 911 or go to your nearest emergency room. Many counties have a specialized crisis center where you can walk in for a crisis assessment and referrals to additional services. Appointments are not needed, and they are open 24 hours a day, 7 days a week    24/7 Crisis Centers      [facility] (Adult patients only)  [address] [place] [address]  [phone]  Sunday - Saturday - 24 hours/day    [facility] (Adult and Pediatric patients)  [address] [place] [address]  [phone]  Sunday - Saturday - 24 hours/day    How to Find Mental Health Treatment   If you have health insurance, look on your insurance card for the phone number or website to find a list of mental health providers who accept your insurance.      If you have Medicaid, Medicare, or no insurance, see below for your county's MCO. Call the phone number to make an appointment or get more information.    If you have Medicaid, you can also get extra help finding services from the [facility] [place] Office. Call them at [phone] from 8 a.m. to 5 p.m., Monday through Friday, except State holidays.      Mobile Crisis   Crisis situations are often best resolved at home. Mobile Crisis Teams are available 24 hours a day in all counties. Professional counselors will speak with you and your family during a visit. They have an average response time of 2 hours.       [facility]  [place] Served: [place], Conservation officer, historic buildings, Scientist, physiological, [place], [place], [place], [place], [place], [place], [place], [place], [place], [place], [place], [place], [place], [place], [place], [place], [place], [place], [place], [place], [place], [place], [place], [place]ie K[place]  Crisis Line: [phone]  Website: www.[facility].com    24/7 Mobile Crisis:    [facility] Mobile Crisis: [phone]     ([address] [place], [place], [place], [place], [place], [place], [place], [place], [place])    [place] [facility] Access to [employer] (Mobile Crisis Connection): [phone]  ([address], [place], Scientist, physiological, [place], [place], [place], [place], [place], [place], [place], [place], [place], [place], [place], [place], [place], [place], [place], [place], [place], [place], [place], [place], [place], [place], [place], [place], [place], [place], [place], [place], [place])    [place] Resources:    [facility] [facility]  [address] [place], [place]  Phone: [phone]  Hours: M,W,F 8am to 2pm

## 2023-08-05 NOTE — Unmapped (Signed)
INITIAL BEHAVIORAL HEALTH ADULT ASSESSMENT    Virtual Visit     PCP: Rosine Beat, Benison Pap, DO  Address on File: [address], [place] [address], [place]      Verified as Current Location: Yes  Extended Emergency Contact Information  Primary Emergency Contact: Arlana [place]  Home Phone: [phone]  Mobile Phone: [phone]  Relation: Mother  Secondary Emergency Contact: curtis,andrew  Mobile Phone: [phone]  Relation: Other     Verified Behavioral Health Emergency Contact: Primary      The patient reports they are physically located in [place] and is currently: at home. I conducted a phone visit. I spent on a phone call with the patient. I spent an additional 20 minutes on pre- and post-visit activities on the date of service .     Lori Bowers, 37 y.o., female is here because of anxiety as referred by her PCP.     Pt reports anxiety, and skin picking that are quite bothersome to her. She states I feel like I'm on the edge all the time for no reason. Admits she's tried medications in the past and they haven't helped    At this time, pt endorses low mood, poor sleep and appetite, anhedonia, low energy and motivation, feeling bad about herself, anxiety, easily overwhelmed, trouble relaxing and controlling her worry, restlessness, irritability, and feeling as if something awful is going to happen. She says she's been picking at her skin since she was a teenager and it worsens under stress.  Lately she says she's been very stressed and this has been worse than usual for her.  Admits to having panic attacks where she describes tunnel vision, trouble breathing ,heaviness on her chest, and frequently feeling like my insides are knotting up.  States she had to leave work last week bc of a panic attack that she couldn't get under control. 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 Says she's experienced numerous significant losses (significant female figure who raised her died in a car accident and bro and cousin died from overdoses.   Her biggest stressors include work stress, and feeling as if she's not good enough as a parents.  Her main supports are her husband and mother.       Pt says she has dyslexia and completed partial 12th grade year and is working on getting her GED. Admits she's always had trouble sitting still and concentrating.          Psych history  Psych history: Previous outpatient therapy, Previous psych med trials, and Substance use  Last time in therapy 2 years ago and didn't find it helpful   Tried prozac, lexapro, and others but cannot recall  on cymbalta now and can't tell a difference. Seroquel for insomnia. Can fall asleep but can't stay asleep   Only med that helped was gabapentin. Last itme this was prescribed to her was a year ago   Went to rehab for a few months in 2019 for SUDs     Symptoms    Depression  Depressed mood, Sleep disturbance, Decreased interest, Appetite disturbance, Feelings of worthlessness or guilt, Fatigue or loss of energy, Decreased concentration, Psychomotor agitation or retardation, and Irritability    PHQ-9 completed during this session. Purpose of this screener discussed and score explained to patient.   PHQ-9 PHQ-9 TOTAL SCORE   08/05/2023   2:59 PM 6   04/02/2023  2:56 PM 4         PHQ-9 Score: 6    Screening complete, depression identified / today's follow-up action documented in note      Mania  Has there ever been a period of at least four days when you were so happy or excited that you got into trouble, or your family or friends worried about it or a doctor said you were manic? no    If yes, continue assessing for bipolar disorder. Diagnostic criteria include the concurrent presence of at least FOUR of the following symptoms (one of which must be the first symptom listed): Bipolar Disorder: Not applicable    Anxiety  Anxiety: Excessive anxiety or worry, Restlessness, Easily fatigued, Difficulty concentrating, Irritability, Muscle tension, Sleep disturbance, Significant distress, and Impairment in functioning    GAD-7 completed during this session. GAD-7 Total Score: 8 Purpose of screener discussed and score explained to patient.  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   Christian          Current family structure (Include previous relationships if relevant):  Married and Children  Live husband and 2 sons (ages 84 and 69). Married for 17 years.   Helps to take care of her mom .  Pt  works full time as a PCA     What information would be good for me to know about your family of origin that currently affects you?   No info at this time            MENTAL STATUS EXAM    Appearance:   Appropriate dress   Behavior:  Cooperative, Difficulty sitting still/fidgety   Motor:  Psychomotor agitation   Speech/Language:   Pressured   Mood:  Anxious   Affect:  Anxious, Depressed, and Expansive   Thought process:  Logical, Linear, Clear, Coherent, and Goal directed   Thought content:    Denies SI, HI, self-harm, delusions, obsessions, paranoid ideation, or ideas of reference   Perceptual disturbances:    Denies auditory and visual hallucinations   Orientation:  Oriented to person, place, time and general circumstances   Attention:  Easily distractible, Reduced ability to focus attention   Concentration:  Distractible   Memory:  Immediate short-term, long-term, and recall grossly intact    Fund of knowledge:   Consistent with level of education and development   Insight:    Limited   Judgment:   No observable deficit   Impulse Control:  No observable deficit     Safety Screening  Does patient have access to guns/firearms? No    Suicide Risk Factors   EMB Suicide Risk: A suicide risk assessment was performed during this evaluation. This patient is not deemed to be at current risk for suicide.   Denies SI    Violence Risk Factors  A violence risk assessment was performed during this assessment. This patient was not deemed to be at elevated risk for violence.  Violence Risk: Denies HI    Mitigating Factors  These risk factors are mitigated by the following factors:  No known access to weapons, No history of previous suicide attempts, No history of violence, Motivation for treatment, Utilization of positive coping skills, Supportive family, Sense of responsibility to family and social supports, Minor children living at home, Presence of any significant relationship, Presence of an available support system, Employment, Functioning in a Veterinary surgeon setting, Expresses purpose for living, Religious or spiritual prohibition to suicide or violence, Current treatment compliance, Effective problem solving skills, Safe housing, Stable housing, and Support system in agreement with treatment recommendations      PLAN     Orientation to Brief Model    Oriented patient to brief model including:  Up to 12 sessions  Focus on here and now versus past  Identify one or two specific goals to work on  [employer] will be an expected part of treatment process  Community resources for additional behavioral health services will be provided to patient if needed or requested.  Behavioral health visits follow the [facility] policy for no show appointments. Patients who no show, arrive late or cancel within 24 hours of scheduled appointments 3 times within 12 months with an individual provider can be dismissed from the practice. Patient will be contacted after each no show and given the opportunity to reschedule. Exceptions include events that are out of the patient's control including, but not limited to: hospital admission, death in family, accidental, illness. If the clinic has a delayed opening, patients won't be penalized for late arrival.       TreatmentModelAgreementECM: Therapist explained brief treatment model. After consideration, LCSW and patient agreed that  she/her/hers has behavioral health needs that can be best addressed with brief treatment model. Patient and LCSW are in agreement to utilize brief interventions, will continue to monitor progress, and adjust treatment approach based on patient functioning.       RESOURCES    Community Resources  Needs a list of med providers accepting her insurance     Emergency Resources  [place] specific crisis resources have been added to the AVS when necessary    Additional resources available 24 Hours a day:  National Suicide Prevention Hotline (629) 492-5050  [place] 518-494-1361    If patient doesn't engage in counseling here and/or local supports are indicated, please give patient information on linkage to local services, 24 hour crisis, mobile crisis supports, and their local crisis assessment center.     Educational Resources   Anxiety    Referrals  N/A    Social Determinants of Health screened today. Interventions provided: Yes - I provided an intervention for the Depression, Financial Resource Strain, and Stress SDOH domain. The intervention was Counseling and Education     Homework assigned for next session:  I wanna get better at coping with my anxiety and not feel so bad about myself.          Anything else pertinent that wasn't addressed during assessment?   Needs resources for med providers NOT [facility]           If patient has UnitedHealthcare or UMR (if applicable), billing was switched to [employer]: N/A      Medicaid Treatment Plan  Does patient have Medicaid? yes      If yes, complete Medicaid Treatment Plan by using .[id number]    Number of behavioral health visits in the last 18 months : 0    Name: KARYNNA HAWKINS   Date: 08/05/2023    MRN: [id number]   DOB: [birth year]    PCP: Karie Georges Pap, DO  Psychotherapy Treatment Plan    # Goal Intervention Person Responsible Frequency and Duration   1. Myley will participate in outpatient psychotherapy for treatment of anxiety diagnosis and related psychiatric symptoms. Individual behavioral health counseling/psychotherapy in the outpatient setting. [place] &/or legally responsible person; Lavone Orn, LCSW We will meet every two weeks &/or per patient request. Frequency of outpatient psychotherapy sessions will decrease as Ema reaches Pronouns: she/her/hers treatment goals. Frequency of outpatient sessions may increase should new needs arise.   2. Pt will work on managing their anxiety and depression by learning and using at least one healthy coping skill 2x a week as evidenced by a reduction in their PQ9 and GAD7 in the next 6-12 sessions. Individual behavioral health counseling/psychotherapy in the outpatient setting. [place] &/or legally responsible person; Lavone Orn, LCSW We will meet every two weeks &/or per patient request. Frequency of outpatient psychotherapy sessions will decrease as Jamaia reaches Pronouns: she/her/herstreatment goals. Frequency of outpatient sessions may increase should new needs arise.            Date Goal is (R) Revised,   (A) Achieved, (N) New,   Ongoing (O), Discontinued (D) Progress Towards Goal   08/05/23 New Goal # 1 is new. Progress towards meeting this goal will be assessed at a future date.   08/05/23 New Goal # 2 is new. Progress towards meeting this goal will be assessed at a future date.              Page 2 Lori Bowers Treatment Plan)    Patient Signature Therapist Signature Date   Mikylah Townson (gave verbal consent during video visit) Jacquelynn Cree, LCSW 08/05/23

## 2023-09-03 NOTE — Unmapped (Signed)
Scheduled Personnel officer Health Appointment

## 2023-09-10 NOTE — Unmapped (Signed)
Outreached to patient to schedule Behavioral Health Visit.  Patient was unable to be contacted. Left Message.  Patient has no showed twice Sept 6th and Sept 16th. No additional outreaches, patient can call back to scheduled when ready.    Abstraction Result Flowsheet Data    This patient's last AWV date: [facility] Last Medicare Wellness Visit Date: Not Found  This patients last WCC/CPE date: : Not Found      Reason for Encounter  Reason for Encounter: Outreach  Primary Reason for Outreach: [employer] Follow Up  Text Message: No  MyChart Message: No  Outreach Call Outcome: Left message

## 2023-09-12 ENCOUNTER — Ambulatory Visit
Admit: 2023-09-12 | Payer: PRIVATE HEALTH INSURANCE | Attending: Student in an Organized Health Care Education/Training Program | Primary: Student in an Organized Health Care Education/Training Program

## 2023-09-16 NOTE — Unmapped (Signed)
No show/no answer  This encounter was created in error - please disregard.

## 2023-09-16 NOTE — Unmapped (Signed)
Called patient for appt at 3:00 with Sue Lush. Unable to reach will send MyChart link.

## 2023-09-16 NOTE — Unmapped (Signed)
Attempted to call patient again left voicemail.  Patient will need to reschedule appt. to further discuss birth control options.  Thank you.

## 2023-11-18 ENCOUNTER — Ambulatory Visit (INDEPENDENT_AMBULATORY_CARE_PROVIDER_SITE_OTHER): Payer: 59 | Admitting: Internal Medicine

## 2023-11-18 ENCOUNTER — Encounter: Payer: Self-pay | Admitting: Internal Medicine

## 2023-11-18 VITALS — BP 155/98 | HR 99 | Temp 98.1°F | Ht 61.0 in | Wt 165.6 lb

## 2023-11-18 DIAGNOSIS — F4310 Post-traumatic stress disorder, unspecified: Secondary | ICD-10-CM | POA: Diagnosis not present

## 2023-11-18 DIAGNOSIS — F1991 Other psychoactive substance use, unspecified, in remission: Secondary | ICD-10-CM

## 2023-11-18 DIAGNOSIS — G609 Hereditary and idiopathic neuropathy, unspecified: Secondary | ICD-10-CM

## 2023-11-18 DIAGNOSIS — I10 Essential (primary) hypertension: Secondary | ICD-10-CM | POA: Diagnosis not present

## 2023-11-18 DIAGNOSIS — B182 Chronic viral hepatitis C: Secondary | ICD-10-CM

## 2023-11-18 DIAGNOSIS — F11288 Opioid dependence with other opioid-induced disorder: Secondary | ICD-10-CM

## 2023-11-18 DIAGNOSIS — R638 Other symptoms and signs concerning food and fluid intake: Secondary | ICD-10-CM | POA: Insufficient documentation

## 2023-11-18 DIAGNOSIS — Z720 Tobacco use: Secondary | ICD-10-CM

## 2023-11-18 DIAGNOSIS — Z09 Encounter for follow-up examination after completed treatment for conditions other than malignant neoplasm: Secondary | ICD-10-CM

## 2023-11-18 DIAGNOSIS — R030 Elevated blood-pressure reading, without diagnosis of hypertension: Secondary | ICD-10-CM

## 2023-11-18 DIAGNOSIS — J42 Unspecified chronic bronchitis: Secondary | ICD-10-CM

## 2023-11-18 DIAGNOSIS — F419 Anxiety disorder, unspecified: Secondary | ICD-10-CM

## 2023-11-18 MED ORDER — HYDROCHLOROTHIAZIDE 25 MG PO TABS: 25.0000 mg | ORAL_TABLET | Freq: Every day | ORAL | 3 refills | Status: DC

## 2023-11-18 MED ORDER — BUPRENORPHINE HCL-NALOXONE HCL 8-2 MG SL FILM: 2.0000 | ORAL_FILM | Freq: Every day | SUBLINGUAL | 0 refills | Status: DC

## 2023-11-18 NOTE — Patient Instructions (Addendum)
VISIT SUMMARY:  During today's visit, we discussed your history of Hepatitis C, opioid use disorder, anxiety, and general health maintenance. We reviewed your previous treatments and current symptoms, and we have developed a plan to address your concerns and improve your overall health.  YOUR PLAN:  -OPIOID USE DISORDER: Opioid use disorder is a condition where there is a dependency on opioids, leading to significant health and social issues. We will start you on Suboxone therapy, beginning with an 8 mg film and potentially increasing to 16 mg/day based on your cravings. You will receive a seven-day supply (10 films) and need to upload pictures of the remaining films and wrappers to MyChart. A follow-up is scheduled in seven days.  -ANXIETY DISORDER: Anxiety disorder involves excessive worry and fear that can interfere with daily activities. We will discontinue your current medication, Cymbalta, and refer you to psychiatry for further evaluation and management. We encourage you to continue follow-up with your therapist at [facility].  -HEPATITIS C: Hepatitis C is a liver infection caused by the Hepatitis C virus. It is important to resume treatment with [facility] to prevent liver damage. Please contact your previous hepatologist at [facility] to restart your treatment now that you have Medicaid coverage.  -GENERAL HEALTH MAINTENANCE: We discussed the importance of vaccinations, including the flu vaccine, Tdap, and COVID vaccine, to prevent serious illnesses and protect public health. We encourage you to receive these vaccines.  INSTRUCTIONS:  A follow-up is scheduled in seven days. Please upload pictures of your Suboxone films and wrappers to MyChart after Thanksgiving.     [facility] Address: [address], [place], [place], [address] Phone: [phone] Services: Individual Counseling, Family Therapy, Couples Counseling, Trauma Therapy, Stress Management,  Depression Treatment, Anxiety Treatment, and Grief Counseling   [facility] Louis, [facility] Address: [address] [place], [place], [place], [address] Phone: [phone] Specializes in EMDR, PTSD, anxiety, cognitive behavioral therapy, and other disorders   [facility] Address: [address] [place], [place], [place], [address] Phone: [phone] Offers professional counseling specializing in Complex Trauma and Dissociative Disorders, mood disorders, personality disorders, and EMDR 3.  [facility] Address: [address] [place], [place], [address] Phone: [phone] Specializes in trauma, addiction, and perinatal therapy services 4.  [facility] [address] [place], [place] [address] [phone]

## 2023-11-18 NOTE — Progress Notes (Unsigned)
[facility]  Phone: [phone]  - Medical Office Visit -  Visit Date: 11/18/2023 Patient: Denise Rubio   DOB: 09-27-1986   37 y.o. Female  MRN: [id number] Patient Care Team: Lula Olszewski, MD as PCP - General (Internal Medicine) Today's Health Care Provider: Lula Olszewski, MD  ===========================================   Assessment & Plan Opioid dependence with other opioid-induced disorder [facility]) Opioid Use Disorder They are currently dependent on opioids and have recently used Kratom, presenting in withdrawal. They have been in recovery for five years and prefer Suboxone over methadone due to past negative experiences, finding Suboxone effective previously. We discussed the risks, including misuse and dependency, and the benefits, such as reduced cravings and lower relapse rates, emphasizing the need for regular follow-ups and compliance with DEA regulations. We will initiate Suboxone therapy starting with an 1/4 of an 8 mg film, titrating up to 16 mg/day based on cravings. We will provide a five day supply of Suboxone (10 films) and instruct them to upload pictures of remaining films and wrappers to MyChart. A follow-up is scheduled for next week.  History of drug use disorder Remote history polysubstance and extensive treatment(s) cut short by losing insurance but now has medicaid Encouraged patient to take advantage of current insurance to re-establish treatment(s)  PTSD (post-traumatic stress disorder) Gave Eye Movement Desensitization and Reprocessing (EMDR) options in [place] and psychiatry referral , she already scheduled for behavioral health.  Hypertension, unspecified type Added hydrochloroTHIAZIDE- but we didn't discuss at visit, wanted her to have it available if home blood pressure not controlled after starting suboxone.   Difficult to say if lisinopril 40 is adequate Chronic hepatitis C without hepatic coma (HCC) Hepatitis C They require  treatment with Mavyret, previously under hepatologist care at [facility] but interrupted due to loss of insurance. Now that they have Medicaid coverage, we discussed the importance of resuming treatment to prevent liver damage and improve long-term health outcomes. We advise contacting their previous hepatologist at [facility] to resume Mavyret treatment. Anxiety Anxiety Disorder They have significant anxiety, previously managed with gabapentin, and found Cymbalta ineffective and causing side effects. They desire to discontinue Cymbalta. We discussed a tapering plan and the potential benefits of a psychiatric evaluation for anxiety and possible bipolar disorder. We will discontinue Cymbalta, refer them to psychiatry for evaluation and management of anxiety and potential bipolar disorder, and encourage follow-up with their therapist at [facility]. Tobacco user  Weight disorder  Assessment and Plan           ED Discharge Orders          Ordered    Drug Screen, 5 Panel, Ur  Status:  Canceled        11/18/23 1124    Buprenorphine HCl-Naloxone HCl (SUBOXONE) 8-2 MG FILM  Daily        11/18/23 1124    Ambulatory referral to Psychiatry        11/18/23 1139    BUPRENORPHINE + Drug Screen, Ur        11/18/23 1144    hydrochlorothiazide (HYDRODIURIL) 25 MG tablet  Daily        11/18/23 1256          Diagnoses and all orders for this visit: Opioid dependence with other opioid-induced disorder (HCC) -     Buprenorphine HCl-Naloxone HCl (SUBOXONE) 8-2 MG FILM; Place 2 Film under the tongue daily at 6 (six) AM. At first, take only 0.25  films (2 mg opioid) each  hour until minimum effective dose reached to control cravings, up to 2 films total in day max. -     BUPRENORPHINE + Drug Screen, Ur History of drug use disorder PTSD (post-traumatic stress disorder) -     Ambulatory referral to Psychiatry Hypertension, unspecified type -     hydrochlorothiazide (HYDRODIURIL) 25 MG tablet; Take 1 tablet (25 mg  total) by mouth daily. To start, take half tablet daily, only if home blood pressures are over 140/90 Chronic hepatitis C without hepatic coma (HCC) Anxiety Tobacco user Weight disorder  Follow-up A follow-up is scheduled in seven days. We instructed them to upload pictures of Suboxone films and wrappers to MyChart after Thanksgiving. Future Appointments  Date Time Provider Department Center  12/05/2023  2:00 PM Lula Olszewski, MD [facility]       Subjective  37 y.o. female who has HTN (hypertension); Anxiety; Chronic hepatitis C without hepatic coma (HCC); Insomnia; Tobacco user; History of drug use disorder; PTSD (post-traumatic stress disorder); and Weight disorder on their problem list. Her reasons/main concerns/chief complaints for today's office visit are New Patient (Initial Visit), Discuss medication (Cymbalta and Seroquel, wants to switch to another medication.), and Hypertension   ----------------------------------------------------------------------------------------------------------------- AI-Extracted: Discussed the use of AI scribe software for clinical note transcription with the patient, who gave verbal consent to proceed.  History of Present Illness           ----------------------------------------------------------------------------------------------------------------- Problem list overviews that were updated at today's visit: Problem  History of Drug Use Disorder   Reported 5 year(s) sober at 10/2023 visit Complicated by Hepatitis C Virus (HCV) from remote history ivdu    Ptsd (Post-Traumatic Stress Disorder)  Weight Disorder  Insomnia  Anxiety  Tobacco User  Chronic Hepatitis C Without Hepatic Coma (Hcc)  Polysubstance Abuse (Hcc) (Resolved)  Sirs (Systemic Inflammatory Response Syndrome) (Hcc) (Resolved)  IV Drug Abuse (Hcc) (Resolved)    Medications reviewed and modified: Current Outpatient Medications on File Prior to Visit  Medication Sig    benzonatate (TESSALON) 200 MG capsule Take 200 mg by mouth 3 (three) times daily as needed.   levonorgestrel (MIRENA) 20 MCG/DAY IUD by Intrauterine route.   lisinopril (ZESTRIL) 40 MG tablet Take 40 mg by mouth daily.   promethazine-dextromethorphan (PROMETHAZINE-DM) 6.25-15 MG/5ML syrup Take 5 mLs by mouth every 4 (four) hours as needed.   No current facility-administered medications on file prior to visit.   Medications Discontinued During This Encounter  Medication Reason   nicotine (NICODERM CQ - DOSED IN MG/24 HOURS) 14 mg/24hr patch    doxycycline (VIBRAMYCIN) 50 MG capsule    naproxen (NAPROSYN) 500 MG tablet    HYDROcodone-acetaminophen (NORCO/VICODIN) 5-325 MG tablet    fluconazole (DIFLUCAN) 150 MG tablet    ALPRAZolam (XANAX) 0.25 MG tablet Change in therapy   DULoxetine (CYMBALTA) 30 MG capsule Completed Course   QUEtiapine (SEROQUEL XR) 50 MG TB24 24 hr tablet     Problems: has HTN (hypertension); Anxiety; Chronic hepatitis C without hepatic coma (HCC); Insomnia; Tobacco user; History of drug use disorder; PTSD (post-traumatic stress disorder); and Weight disorder on their problem list. Current Meds  Medication Sig   benzonatate (TESSALON) 200 MG capsule Take 200 mg by mouth 3 (three) times daily as needed.   Buprenorphine HCl-Naloxone HCl (SUBOXONE) 8-2 MG FILM Place 2 Film under the tongue daily at 6 (six) AM. At first, take only 0.25  films (2 mg opioid) each hour until minimum effective dose reached to control cravings, up to 2  films total in day max.   hydrochlorothiazide (HYDRODIURIL) 25 MG tablet Take 1 tablet (25 mg total) by mouth daily. To start, take half tablet daily, only if home blood pressures are over 140/90   levonorgestrel (MIRENA) 20 MCG/DAY IUD by Intrauterine route.   lisinopril (ZESTRIL) 40 MG tablet Take 40 mg by mouth daily.   promethazine-dextromethorphan (PROMETHAZINE-DM) 6.25-15 MG/5ML syrup Take 5 mLs by mouth every 4 (four) hours as needed.    [DISCONTINUED] DULoxetine (CYMBALTA) 30 MG capsule Take 30 mg by mouth daily.   Allergies:  No Known Allergies Past Medical History:  has a past medical history of Anxiety, Asthma, Heroin abuse (HCC), Hypertension, IV drug abuse (HCC) (04/01/2016), Polysubstance abuse (HCC) (04/01/2023), and SIRS (systemic inflammatory response syndrome) (HCC) (04/01/2016). Past Surgical History:   has a past surgical history that includes Abscess drainage (Left); Tubal ligation; Tonsillectomy; and Dental surgery. Social History:   reports that she has been smoking cigarettes. She has never used smokeless tobacco. She reports that she does not currently use drugs after having used the following drugs: Marijuana and Heroin. She reports that she does not drink alcohol. Family History:  family history includes Cancer in her brother; Drug abuse in her brother, brother, and brother; Hypertension in her father, mother, and sister. Depression Screen and Health Maintenance:    11/18/2023   10:23 AM  PHQ 2/9 Scores  PHQ - 2 Score 2  PHQ- 9 Score 5   Health Maintenance  Topic Date Due   COVID-19 Vaccine (1 - 2023-24 season) 12/04/2023 (Originally 08/25/2023)   INFLUENZA VACCINE  03/23/2024 (Originally 07/25/2023)   DTaP/Tdap/Td (1 - Tdap) 11/17/2024 (Originally 07/16/2007)   HIV Screening  11/17/2024 (Originally 07/16/2003)   Cervical Cancer Screening (HPV/Pap Cotest)  07/29/2028   Hepatitis C Screening  Completed   HPV VACCINES  Aged Out    There is no immunization history on file for this patient.   Objective   Physical ExamBP (!) 155/98   Pulse 99   Temp 98.1 F (36.7 C) (Temporal)   Ht 5\' 1"  (1.549 m)   Wt 165 lb 9.6 oz (75.1 kg)   SpO2 99%   BMI 31.29 kg/m  Wt Readings from Last 10 Encounters:  11/18/23 165 lb 9.6 oz (75.1 kg)  02/19/23 167 lb (75.8 kg)  08/10/21 148 lb (67.1 kg)  11/18/16 105 lb (47.6 kg)  04/01/16 110 lb 6.4 oz (50.1 kg)  06/23/15 115 lb (52.2 kg)  06/22/15 114 lb (51.7 kg)   05/17/15 110 lb (49.9 kg)  Vital signs reviewed.  Nursing notes reviewed. Weight trend reviewed. Abnormalities and problem-specific physical exam findings:   General Appearance:  Well developed, well nourished, well-groomed, healthy-appearing female with Body mass index is 31.29 kg/m. No acute distress appreciable.   Skin: Clear and well-hydrated. Pulmonary:  Normal work of breathing at rest, no respiratory distress apparent. SpO2: 99 %  Musculoskeletal: She demonstrates smooth and coordinated movements throughout all major joints.All extremities are intact.  Neurological:  Awake, alert, oriented, and engaged.  No obvious focal neurological deficits or cognitive impairments.  Sensorium seems unclouded.  Psychiatric:  Appropriate mood, pleasant and cooperative demeanor, cheerful and engaged during the exam  Reviewed Results & Data Results         {Insert previous labs (optional):23779} {See past labs  Heme  Chem  Endocrine  Serology  Results Review (optional):1}  No results found for any visits on 11/18/23.  Admission on 02/19/2023, Discharged on 02/19/2023  Component Date Value  WBC 02/19/2023 11.6 (H)    RBC 02/19/2023 4.75    Hemoglobin 02/19/2023 15.1 (H)    HCT 02/19/2023 45.7    MCV 02/19/2023 96.2    MCH 02/19/2023 31.8    MCHC 02/19/2023 33.0    RDW 02/19/2023 12.7    Platelets 02/19/2023 302    nRBC 02/19/2023 0.0    Neutrophils Relative % 02/19/2023 68    Neutro Abs 02/19/2023 7.8 (H)    Lymphocytes Relative 02/19/2023 20    Lymphs Abs 02/19/2023 2.3    Monocytes Relative 02/19/2023 8    Monocytes Absolute 02/19/2023 0.9    Eosinophils Relative 02/19/2023 4    Eosinophils Absolute 02/19/2023 0.5    Basophils Relative 02/19/2023 0    Basophils Absolute 02/19/2023 0.0    Immature Granulocytes 02/19/2023 0    Abs Immature Granulocytes 02/19/2023 0.05    Sodium 02/19/2023 133 (L)    Potassium 02/19/2023 4.5    Chloride 02/19/2023 100    CO2 02/19/2023 22     Glucose, Bld 02/19/2023 77    BUN 02/19/2023 10    Creatinine, Ser 02/19/2023 0.70    Calcium 02/19/2023 9.1    Total Protein 02/19/2023 7.6    Albumin 02/19/2023 3.6    AST 02/19/2023 62 (H)    ALT 02/19/2023 69 (H)    Alkaline Phosphatase 02/19/2023 86    Total Bilirubin 02/19/2023 1.1    GFR, Estimated 02/19/2023 >60    Anion gap 02/19/2023 11    No image results found.   No results found.  US BREAST LTD UNI LEFT INC AXILLA  Result Date: 02/19/2023 CLINICAL DATA:  Patient presents to the emergency room for possible left breast infection/abscess. EXAM: ULTRASOUND OF THE LEFT BREAST COMPARISON:  Previous exam(s). FINDINGS: Note that this exam was performed through the emergency department as there was no breast imaging radiologist present for the exam. Targeted ultrasound is performed, showing a superficial complex fluid collection within the thickened skin of the area of concern over the upper central periareolar region. This collection as a small component extending deeper into the breast tissue and may be an evolving abscess. IMPRESSION: Superficial complex fluid collection within the skin of the upper central periareolar region of the left breast. In the proper clinical setting, this is compatible with superficial infection/mastitis with small deeper component possibly evolving abscess. RECOMMENDATION: Recommend initiating 7-10 day course of antibiotics as well as further complete diagnostic imaging workup with [facility]ogram[facility] at [facility]. I have discussed the findings and recommendations with the patient. If applicable, a reminder letter will be sent to the patient regarding the next appointment. BI-RADS CATEGORY  0: Incomplete. Need additional imaging evaluation and/or prior mammograms for comparison. Electronically Signed   By: Elberta Fortis M.D.   On: 02/19/2023 19:34       Additional notes: Initial Appointment Goals:  This initial visit focused on establishing  a foundation for the patient's care. We collaboratively reviewed her medical history and medications in detail, updating the chart as shown in the encounter. Given the extensive information, we prioritized addressing her most pressing concerns, which she reported were: New Patient (Initial Visit), Discuss medication (Cymbalta and Seroquel, wants to switch to another medication.), and Hypertension  While the complexity of the patient's medical picture may necessitate further evaluation in subsequent visits, we were able to develop a preliminary care plan together. To expedite a comprehensive plan at the next visit, we encouraged the patient to gather relevant medical records from previous providers.  This collaborative approach will ensure a more complete understanding of the patient's health and inform the development of a personalized care plan. We look forward to continuing the conversation and working together with the patient on achieving her health goals.   Collaborative Documentation:  Today's encounter utilized real-time, dynamic patient engagement.  Patients actively participate by directly reviewing and assisting in updating their medical records through a shared screen. This transparency empowers patients to visually confirm chart updates made by the healthcare provider.  This collaborative approach facilitates problem management as we jointly update the problem list, problem overview, and assessment/plan. Ultimately, this process enhances chart accuracy and completeness, fostering shared decision-making, patient education, and informed consent for tests and treatments.  Collaborative Treatment Planning:  Treatment plans were discussed and reviewed in detail.  Explained medication safety and potential side effects.  Encouraged participation and answered all patient questions, confirming understanding and comfort with the plan. Encouraged patient to contact our office if they have any questions or  concerns. Agreed on patient returning to office if symptoms worsen, persist, or new symptoms develop. Discussed precautions in case of needing to visit the Emergency Department.  ----------------------------------------------------- Lula Olszewski, MD  11/18/2023 12:56 PM  [facility]:  [phone]

## 2023-11-18 NOTE — Assessment & Plan Note (Signed)
Remote history polysubstance and extensive treatment(s) cut short by losing insurance but now has medicaid Encouraged patient to take advantage of current insurance to re-establish treatment(s)

## 2023-11-18 NOTE — Assessment & Plan Note (Signed)
Gave Eye Movement Desensitization and Reprocessing (EMDR) options in  and psychiatry referral , she already scheduled for behavioral health.

## 2023-11-18 NOTE — Assessment & Plan Note (Signed)
Added hydrochloroTHIAZIDE- but we didn't discuss at visit, wanted her to have it available if home blood pressure not controlled after starting suboxone.   Difficult to say if lisinopril 40 is adequate

## 2023-11-18 NOTE — Assessment & Plan Note (Signed)
Anxiety Disorder They have significant anxiety, previously managed with gabapentin, and found Cymbalta ineffective and causing side effects. They desire to discontinue Cymbalta. We discussed a tapering plan and the potential benefits of a psychiatric evaluation for anxiety and possible bipolar disorder. We will discontinue Cymbalta, refer them to psychiatry for evaluation and management of anxiety and potential bipolar disorder, and encourage follow-up with their therapist at [facility].

## 2023-11-18 NOTE — Assessment & Plan Note (Signed)
Hepatitis C They require treatment with Mavyret, previously under hepatologist care at [facility] but interrupted due to loss of insurance. Now that they have Medicaid coverage, we discussed the importance of resuming treatment to prevent liver damage and improve long-term health outcomes. We advise contacting their previous hepatologist at [facility] to resume Mavyret treatment.

## 2023-11-20 ENCOUNTER — Encounter: Payer: Self-pay | Admitting: Internal Medicine

## 2023-11-20 DIAGNOSIS — J42 Unspecified chronic bronchitis: Secondary | ICD-10-CM | POA: Insufficient documentation

## 2023-11-20 DIAGNOSIS — R030 Elevated blood-pressure reading, without diagnosis of hypertension: Secondary | ICD-10-CM | POA: Insufficient documentation

## 2023-11-20 DIAGNOSIS — G609 Hereditary and idiopathic neuropathy, unspecified: Secondary | ICD-10-CM | POA: Insufficient documentation

## 2023-11-20 NOTE — Assessment & Plan Note (Signed)
Hypertension (I10) BP elevated today at 155/98, requires close monitoring with initiation of buprenorphine therapy.  Plan: - Continue lisinopril 40mg  daily - Added HCTZ 25mg  daily PRN for home BP >140/90 - Home BP monitoring recommended - Follow-up BP check at next visit

## 2023-11-20 NOTE — Assessment & Plan Note (Signed)
Tobacco Use Disorder (F17.200) Daily cigarette smoking with morning cough and dyspnea.  Plan: - Smoking cessation counseling provided - Patient not ready to quit currently - Will revisit once substance use disorder stabilized - Encouraged reduction if unable to quit

## 2023-11-23 LAB — BUPRENORPHINE + DRUG SCREEN, UR
6-Acetylmorphine: NEGATIVE ng/mL
Amphetamines: NEGATIVE ng/mL
Barbiturates: NEGATIVE ng/mL
Benzodiazepines: NEGATIVE ng/mL
Carisoprodol: NEGATIVE ng/mL
Cocaine Metabolite: NEGATIVE ng/mL
Creatinine: 100 mg/dL
Fentanyl: NEGATIVE ng/mL
Gabapentin: NEGATIVE ug/mL
Methadone: NEGATIVE ng/mL
Nitrites: NEGATIVE ug/mL
Opiates: NEGATIVE ng/mL
Oxycodone: NEGATIVE ng/mL
Phencyclidine (PCP): NEGATIVE ng/mL
Propoxyphene: NEGATIVE ng/mL
Tapentadol: NEGATIVE ng/mL
Tramadol: NEGATIVE ng/mL
Urine pH: 7.5 (ref 4.5–8.9)

## 2023-11-23 LAB — ALCOHOL BIOMARKERS-CONFIRM
ETHANOL BIOMARKERS: POSITIVE — AB
Ethyl Glucuronide: >50000 ng/mg{creat}
Ethyl Sulfate: 7878 ng/mg{creat}

## 2023-11-23 LAB — BUPRENORPHINE/NALOXONE CONFIRM
BUPRENORPHINE: POSITIVE — AB
Buprenorphine/CR: 35 ng/mg{creat}
Naloxone: 33 ng/mg{creat}
Norbup/Bup Ratio: 5.71 (ref >=0.3)
Norbuprenorphine/CR: 200 ng/mg{creat}
OPIATE ANTAGONIST: POSITIVE — AB

## 2023-11-23 LAB — CARBOXY-THC NORMALIZED RATIO
CANNABINOIDS: POSITIVE — AB
THC/CR Ratio: 92 ng/mg{creat}

## 2023-11-24 NOTE — Progress Notes (Signed)
Labs consistent with  expected.  Patient pre-reported urine drug screen would not be without substances so this should be interpreted as negative urine drug screen.   Anticipatory guidance about buprenorphine alcohol interaction given in MyChart comment.

## 2023-11-26 ENCOUNTER — Telehealth (INDEPENDENT_AMBULATORY_CARE_PROVIDER_SITE_OTHER): Payer: 59 | Admitting: Internal Medicine

## 2023-11-26 ENCOUNTER — Encounter: Payer: Self-pay | Admitting: Internal Medicine

## 2023-11-26 DIAGNOSIS — Z7184 Encounter for health counseling related to travel: Secondary | ICD-10-CM

## 2023-11-26 DIAGNOSIS — I10 Essential (primary) hypertension: Secondary | ICD-10-CM

## 2023-11-26 DIAGNOSIS — F1129 Opioid dependence with unspecified opioid-induced disorder: Secondary | ICD-10-CM | POA: Insufficient documentation

## 2023-11-26 DIAGNOSIS — F11288 Opioid dependence with other opioid-induced disorder: Secondary | ICD-10-CM

## 2023-11-26 DIAGNOSIS — Z09 Encounter for follow-up examination after completed treatment for conditions other than malignant neoplasm: Secondary | ICD-10-CM | POA: Diagnosis not present

## 2023-11-26 DIAGNOSIS — Z79899 Other long term (current) drug therapy: Secondary | ICD-10-CM

## 2023-11-26 HISTORY — DX: Encounter for health counseling related to travel: Z71.84

## 2023-11-26 MED ORDER — HYDROCHLOROTHIAZIDE 25 MG PO TABS
25.0000 mg | ORAL_TABLET | Freq: Every day | ORAL | 3 refills | Status: DC
Start: 2023-11-26 — End: 2024-09-28

## 2023-11-26 MED ORDER — BUPRENORPHINE HCL-NALOXONE HCL 8-2 MG SL FILM: 1.0000 | ORAL_FILM | Freq: Every day | SUBLINGUAL | 0 refills | Status: DC

## 2023-11-26 NOTE — Progress Notes (Signed)
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 Current dose of 4mg  BID provides good symptom control. Sleep and daily function significantly improved. Recent urine positive for alcohol but patient acknowledges single use of wine and commits to abstinence. No concerning behaviors for medication misuse. Actively participating in recovery support through NA meetings. Plan:  Continue Suboxone 8mg  films #14, take 1/2 film (4mg ) twice daily Discussed risks of alcohol use with Suboxone including respiratory depression Encouraged continued NA/AA meeting attendance Patient to submit photos of partially used strips for compliance monitoring Follow up in 2 weeks via video visit Reviewed proper sublingual administration technique Emphasized importance of avoiding alcohol completely while on Suboxone Hypertension, unspecified type Hypertension (I10) I am endeavoring to manage both Primary Care Provider (PCP) and recovery needs.  Assessment: Reports good adherence with lisinopril taken nightly. Previously prescribed HCTZ not yet initiated due to communication issues during prior visit. Home BP readings not available for review today. Plan:  Continue lisinopril at current dose Clarified need to start HCTZ - new prescription sent Patient to obtain BP log for next visit Follow up with BP readings in 2 weeks  Opioid dependence with opioid-induced disorder  (HCC)  Follow-up examination  High risk medication use  Counseling about travel She lives far away, parent, doesn't have time to participate in structured frequent visit recovery program, counseled we will allow mostly video visits.   Assessment and Plan  Opioid Use Disorder (F11.20) Assessment: Patient demonstrates excellent early response to Suboxone maintenance therapy with improved functionality, stable mood, and decreased cravings. Current dose of 4mg  BID provides good symptom control. Sleep and daily function significantly improved. Recent urine positive for alcohol but patient acknowledges single use of wine and commits to abstinence. No concerning behaviors for medication misuse. Actively participating in recovery support through NA meetings.  Plan: - Continue Suboxone 8mg  films #14, take 1/2 film (4mg ) twice daily - Discussed risks of alcohol use with Suboxone including respiratory depression - Encouraged continued NA/AA meeting attendance - Patient to submit photos of partially used strips for compliance monitoring - Follow up in 2 weeks via video visit - Reviewed proper sublingual administration technique - Emphasized importance of avoiding alcohol completely while on Suboxone  Hypertension (I10) Assessment: Reports good adherence with lisinopril taken nightly. Previously prescribed HCTZ not yet initiated due to communication issues during prior visit. Home BP readings not available for review today.  Plan: - Continue lisinopril at current dose - Clarified need to start HCTZ - new prescription sent - Patient to obtain BP log for next visit - Follow up with BP readings in 2 weeks  Medical Decision Making Documentation:  Complexity Level: Moderate to High  Number/Complexity of Problems Addressed: - Opioid Use Disorder requiring ongoing medical management with controlled substance - Hypertension requiring medication adjustment and monitoring - Recent alcohol use  requiring risk mitigation strategies  Amount/Complexity of Data Reviewed: - Review and interpretation of urine drug screen  - Prescription monitoring review - Assessment  of medication compliance through patient report and prescription monitoring  Risk Assessment: - High-risk medication management (Suboxone) - Drug-drug and drug-alcohol interaction risk - Risk mitigation for substance use relapse - Medication adjustment for hypertension management  Medical necessity for this level of service is supported by: - Complex medication management requiring controlled substance - Multiple chronic conditions requiring coordination of care - Need for intensive monitoring and risk assessment - Requirement for detailed patient safety education    Treatment plan discussed and reviewed in detail. Explained medication safety and potential side effects.  Answered all patient questions and confirmed understanding and comfort with the plan. Encouraged patient to contact our office if they have any questions or concerns.  Agreed on patient coming for a sooner office visit if symptoms worsen, persist, or new symptoms develop. Discussed precautions particularly regarding alcohol due to she reported alcohol use prior to starting on suboxone and they can't be mixed and she knows and assures she won't.  Follow up 2 week(s). Future Appointments  Date Time Provider Department Center  12/05/2023  2:00 PM Lula Olszewski, MD [facility]  12/10/2023  8:40 AM Lula Olszewski, MD [facility]         Subjective:   Chief Complaint / Reason for Visit:  Medication Refill   37 y.o. female  has a past medical history of Anxiety, Asthma, Heroin abuse (HCC), Hypertension, IV drug abuse (HCC) (04/01/2016), Polysubstance abuse (HCC) (04/01/2023), and SIRS (systemic inflammatory response syndrome) (HCC) (04/01/2016).   History of Present Illness  Patient is a female with history of opioid use disorder presenting  for follow-up of Suboxone management via video visit. She reports significant improvement in functionality and decreased cravings since initiating Suboxone therapy. Current dose is 8mg  total daily (4mg  film AM and 4mg  film PM), which she reports provides good craving control and allows her to feel "more leveled out." Sleep has markedly improved - now sleeping through the night versus previous fragmented sleep.  Patient demonstrates good insight into recovery process and medication management. She describes improved ability to maintain daily routines and responsibilities, stating tasks "don't feel like a chore" anymore. She attends virtual NA meetings for support and understands importance of avoiding alcohol while on Suboxone after one episode of wine consumption since starting therapy. She denies any current cannabis use.  Regarding concurrent medical conditions, she reports consistent adherence with nightly lisinopril for hypertension management. She was previously prescribed hydrochlorothiazide but had not initiated this medication due to confusion during prior visit when experiencing kratom withdrawal symptoms.  Patient expresses strong motivation for recovery, noting December 31st will mark 5 years without injection drug use. She transitioned from kratom (which she had been using for several years) to Suboxone management to achieve more stable recovery. She demonstrates good understanding of proper Suboxone administration and risks of misuse.  Visit conducted via secure video platform with good audio/visual quality throughout. Patient was appropriately dressed, well-groomed, and fully engaged in the discussion. She participated actively in shared decision-making regarding treatment planning.    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 Buprenorphine HCl-Naloxone HCl, benzonatate, chlorhexidine, hydrochlorothiazide, levonorgestrel, lisinopril, and promethazine-dextromethorphan       Objective:  Physical Exam  General Appearance:  Well Developed, Well Nourished, No Acute Distress by Limited Video Assessment Pulmonary:  No Respiratory Distress Apparent. Normal Work of Breathing.   Neurological:  Awake, Alert. No Obvious Focal Neurological Deficits or Cognitive Impairments.  Sensorium Seems Unclouded. Psychiatric:  Appropriate Mood, Pleasant Demeanor, Calm, Articulate, Good Mood  Results Reviewed: Last CBC Lab Results  Component Value Date   WBC 11.6 (H) 02/19/2023   HGB 15.1 (H) 02/19/2023   HCT 45.7 02/19/2023   MCV 96.2 02/19/2023   MCH 31.8 02/19/2023   RDW 12.7 02/19/2023   PLT 302 02/19/2023   Last metabolic panel Lab Results  Component Value Date   GLUCOSE 77 02/19/2023   NA 133 (L) 02/19/2023   K 4.5 02/19/2023   CL 100 02/19/2023   CO2 22 02/19/2023   BUN 10 02/19/2023   CREATININE 0.70 02/19/2023   GFRNONAA >60 02/19/2023   CALCIUM 9.1 02/19/2023   PROT 7.6 02/19/2023   ALBUMIN 3.6 02/19/2023   BILITOT 1.1 02/19/2023   ALKPHOS 86 02/19/2023   AST 62 (H) 02/19/2023   ALT 69 (H) 02/19/2023   ANIONGAP 11 02/19/2023   Last lipids No results found for: "CHOL", "HDL", "LDLCALC", "LDLDIRECT", "TRIG", "CHOLHDL" Last hemoglobin A1c No results found for: "HGBA1C" Last thyroid functions Lab Results  Component Value Date   TSH 0.33 (L) 10/02/2013   Last vitamin D No results found for: "25OHVITD2", "25OHVITD3", "VD25OH" Last vitamin B12 and Folate No results found for: "VITAMINB12", "FOLATE" Drugs of Abuse     Component Value Date/Time   LABOPIA  POSITIVE (A) 06/23/2015 1403   COCAINSCRNUR POSITIVE (A) 06/23/2015 1403   LABBENZ POSITIVE (A) 06/23/2015 1403   AMPHETMU NONE DETECTED 06/23/2015 1403   THCU POSITIVE (A) 06/23/2015 1403   LABBARB NONE DETECTED 06/23/2015 1403        ------------------------------------------------------ Attestation:  Today's Healthcare Provider Lula Olszewski, MD was located at office at [facility] [address] [place] [address]. The patient was located in parked car. Today's Telemedicine visit was conducted via Video for 67m 03s after consent for telemedicine was obtained:  Video connection was never lost All video encounter participant identities and locations confirmed visually and verbally.  Signed: Lula Olszewski, MD 11/26/2023 8:12 PM

## 2023-11-26 NOTE — Assessment & Plan Note (Signed)
Opioid Use Disorder (F11.20) Assessment: Patient demonstrates excellent early response to Suboxone maintenance therapy with improved functionality, stable mood, and decreased cravings. Current dose of 4mg  BID provides good symptom control. Sleep and daily function significantly improved. Recent urine positive for alcohol but patient acknowledges single use of wine and commits to abstinence. No concerning behaviors for medication misuse. Actively participating in recovery support through NA meetings. Plan:  Continue Suboxone 8mg  films #14, take 1/2 film (4mg ) twice daily Discussed risks of alcohol use with Suboxone including respiratory depression Encouraged continued NA/AA meeting attendance Patient to submit photos of partially used strips for compliance monitoring Follow up in 2 weeks via video visit Reviewed proper sublingual administration technique Emphasized importance of avoiding alcohol completely while on Suboxone

## 2023-11-26 NOTE — Assessment & Plan Note (Signed)
Hypertension (I10) I am endeavoring to manage both Primary Care Provider (PCP) and recovery needs.  Assessment: Reports good adherence with lisinopril taken nightly. Previously prescribed HCTZ not yet initiated due to communication issues during prior visit. Home BP readings not available for review today. Plan:  Continue lisinopril at current dose Clarified need to start HCTZ - new prescription sent Patient to obtain BP log for next visit Follow up with BP readings in 2 weeks

## 2023-11-26 NOTE — Addendum Note (Signed)
Addended by: Lula Olszewski on: 11/26/2023 08:12 PM   Modules accepted: Level of Service

## 2023-11-26 NOTE — Assessment & Plan Note (Signed)
She lives far away, parent, doesn't have time to participate in structured frequent visit recovery program, counseled we will allow mostly video visits.

## 2023-12-05 ENCOUNTER — Ambulatory Visit: Payer: Medicaid Other | Admitting: Internal Medicine

## 2023-12-06 ENCOUNTER — Ambulatory Visit: Payer: 59 | Admitting: Internal Medicine

## 2023-12-10 ENCOUNTER — Telehealth: Payer: 59 | Admitting: Internal Medicine

## 2023-12-10 ENCOUNTER — Encounter: Payer: Self-pay | Admitting: Internal Medicine

## 2023-12-10 DIAGNOSIS — F1129 Opioid dependence with unspecified opioid-induced disorder: Secondary | ICD-10-CM | POA: Diagnosis not present

## 2023-12-10 DIAGNOSIS — M546 Pain in thoracic spine: Secondary | ICD-10-CM

## 2023-12-10 DIAGNOSIS — H5789 Other specified disorders of eye and adnexa: Secondary | ICD-10-CM

## 2023-12-10 DIAGNOSIS — G629 Polyneuropathy, unspecified: Secondary | ICD-10-CM | POA: Diagnosis not present

## 2023-12-10 DIAGNOSIS — M5134 Other intervertebral disc degeneration, thoracic region: Secondary | ICD-10-CM

## 2023-12-10 MED ORDER — OLOPATADINE HCL 0.2 % OP SOLN
2.0000 [drp] | Freq: Every day | OPHTHALMIC | 0 refills | Status: AC
Start: 2023-12-10 — End: ?

## 2023-12-10 MED ORDER — BUPRENORPHINE HCL-NALOXONE HCL 8-2 MG SL FILM: 2.0000 | ORAL_FILM | Freq: Every day | SUBLINGUAL | 0 refills | Status: DC

## 2023-12-10 MED ORDER — GABAPENTIN 300 MG PO CAPS: 300.0000 mg | ORAL_CAPSULE | Freq: Three times a day (TID) | ORAL | 0 refills | Status: DC

## 2023-12-10 NOTE — Patient Instructions (Addendum)
Please go to our [facility] office to get your xrays done. You can walk in M-F between 8:30am- noon or 1pm - 5pm. Tell them you are there for xrays ordered by me. They will send me the results, then I will let you know the results with instructions.   Address: [address].  The Xray department is located in the basement.   VISIT SUMMARY:  During today's visit, we addressed your severe upper back pain, which radiates to your chest and causes numbness in your hands, as well as the swelling in your left eye. We discussed your history of chronic back issues and recent exacerbation due to heavy lifting. We also reviewed your current pain management regimen and made adjustments to better control your symptoms.  YOUR PLAN:  -SLIPPED DISC WITH RADICULOPATHY: A slipped disc occurs when a disc in your spine moves out of place, pressing on nearby nerves and causing pain, numbness, or weakness. We will order an urgent x-ray to confirm the diagnosis and assess for any spinal cord compression. You are referred to physical therapy and a pain management specialist. Gabapentin 300 mg three times a day is prescribed, and you should use cold packs and over-the-counter pain relievers like ibuprofen or Aleve. Avoid heavy lifting and listen to your body to prevent worsening pain. Be aware of emergency signs like loss of bowel or bladder control, increasing weakness, or signs of infection, and seek immediate medical attention if these occur.  -CHRONIC PAIN MANAGEMENT: Chronic pain is long-lasting pain that can be constant or intermittent. Your chronic back pain has flared up, so we have temporarily increased your Suboxone dosage to two tablets per day. We will review this adjustment in a follow-up appointment in two weeks.  -LEFT EYE SWELLING: The swelling in your left eye is likely due to an allergic reaction. We prescribed olopatadine eye drops and recommended over-the-counter antihistamines like Benadryl  or Claritin. Rinse your eye thoroughly and monitor for signs of infection. Seek emergency care if symptoms worsen.  -GENERAL HEALTH MAINTENANCE: You have been clean from intravenous substance use for five years, which is commendable. To help manage your back pain, try to reduce smoking as it can exacerbate pain through coughing.  INSTRUCTIONS:  Please schedule a follow-up appointment in two weeks to review the management of your chronic pain, the effectiveness of the increased Suboxone dosage, and the outcomes of the urgent x-ray, physical therapy, and pain management referrals.

## 2023-12-10 NOTE — Progress Notes (Signed)
[facility]: [phone]   Virtual Medical Office Visit - Video Telemedicine   Patient:  Denise Rubio ([birth year])  MRN:   [id number]      Date:   12/10/2023  Today's Healthcare Provider: Lula Olszewski, MD   Assessment & Plan  Assessment & Plan   Main reason for visit: 2 week medication follow-up, Facial Swelling (Swollen eye, feels a knot under eye, itches also.), and Upper back pain (Went to urgent care on 12/13 for severe back pain. Currently having intermittent spasms that radiates to chest. Causing her to vomit. Taking Robaxin and was given a shot of toradol with no relief (thinks she has pinched a nerve). Hand numbness and tingling when back is in certain positions.)  Assessment & Plan Acute bilateral thoracic back pain Chronic Pain Management Her chronic back pain, currently managed with Suboxone, has flared up, necessitating a temporary increase in dosage to two tablets per day. A two-week follow-up appointment is scheduled to monitor this adjustment. Eye swelling, left Left Eye Swelling The swelling in her left eye, likely an allergic reaction, shows no signs of infection. We prescribed olopatadine eye drops and recommended over-the-counter antihistamines like Benadryl or Claritin. She is advised to rinse the eye thoroughly and monitor for infection signs, seeking ER care if symptoms worsen. DDD (degenerative disc disease), thoracic Slipped Disc with Radiculopathy Severe upper back pain radiating to the chest, with numbness in hands and feet, suggests a slipped disc, exacerbated by recent heavy lifting and a similar past injury. We will order an urgent x-ray of the spine to confirm the diagnosis and assess for vertebral shift and spinal cord compression. Referrals to physical therapy and a pain management specialist are urgent. Gabapentin 300 mg TID is prescribed, alongside cold packs and over-the-counter pain relievers like ibuprofen and  Aleve. She is advised to avoid lifting and to listen to her body to prevent worsening pain. We discussed the risks of not addressing the issue, including the potential need for surgery and emergency signs like loss of bowel or bladder control, increasing weakness, or signs of infection.   Given this patient's history, interview and limited video exam, the most likely cause for her back pain is degenerative osteoarthritis and degenerative disk disease of her vertebral column.  I have considered and concluded the patient has a very low likelihood of having one of the following serious causes of back pain: bone tumor (no tumors/masses), acute bone fracture (no severe trauma or bone loss), aortic aneurysm (no high blood pressure, long term smoking, ripping/tearing sensations), vertebral disk infection (fevers,infections, IVDU), or pyelonephritis (no pain radiating to groin, costovertebral angle tenderness).   She also has no evidence for acute spinal compression (no weakness/numbness, loss of bowel or bladder function).  She was advised to go to ER if symptom(s) suggestive of these develop.  These red flag alarm symptom(s) are copied here to his chart for reference.   Standard treatment offered & documented here: For acute pain, rest, intermittent application of cold packs (later, may switch to heat, but do not sleep on heating pad), analgesics and muscle relaxants are recommended.  For more chronic pain, take as needed NSAID's (with plenty of fluids)  Approved increased buprenorphine for increased pain. Proper lifting with avoidance of heavy lifting discussed.  Provided instructions in AVS for how to do stretches and discussed using it for a home back care exercise program with flexion exercise routine.  Physical Therapy and Xray offered and ordered.  she will call or return to clinic prn if these symptoms worsen or fail to improve as anticipated.    Neuropathy  Opioid dependence with opioid-induced  disorder (HCC)   Acute bilateral thoracic back pain -     DG Thoracic Spine W/Swimmers; Future -     Ambulatory referral to Physical Therapy -     Ambulatory referral to Pain Clinic  Eye swelling, left -     Olopatadine HCl; Apply 2 drops to eye daily at 6 (six) AM.  Dispense: 2.5 mL; Refill: 0  DDD (degenerative disc disease), thoracic -     DG Thoracic Spine W/Swimmers; Future -     Ambulatory referral to Physical Therapy -     Ambulatory referral to Pain Clinic  Neuropathy -     Gabapentin; Take 1 capsule (300 mg total) by mouth 3 (three) times daily.  Dispense: 90 capsule; Refill: 0  Opioid dependence with opioid-induced disorder (HCC) -     Buprenorphine HCl-Naloxone HCl; Place 2 Film under the tongue daily at 6 (six) AM. At first, take only 0.25  films (2 mg opioid) each hour until minimum effective dose reached to control cravings, up to 2 films total in day max.  Dispense: 14 each; Refill: 0     Treatment plan discussed and reviewed in detail. Explained medication safety and potential side effects.  Answered all patient questions and confirmed understanding and comfort with the plan. Encouraged patient to contact our office if they have any questions or concerns.  Agreed on patient coming for a sooner office visit if symptoms worsen, persist, or new symptoms develop. Discussed precautions in case of needing to visit the Emergency Department.  General Health Maintenance Having been clean from IV substance use for five years, she is advised to reduce smoking to prevent exacerbating back pain through coughing.  Follow-up A two-week follow-up appointment is scheduled to review the management of her chronic pain, the effectiveness of the increased Suboxone dosage, and the outcomes of the urgent x-ray, physical therapy, and pain management referrals.      Subjective:   Chief Complaint / Reason for Visit:  2 week medication follow-up, Facial Swelling (Swollen eye, feels a knot  under eye, itches also.), and Upper back pain (Went to urgent care on 12/13 for severe back pain. Currently having intermittent spasms that radiates to chest. Causing her to vomit. Taking Robaxin and was given a shot of toradol with no relief (thinks she has pinched a nerve). Hand numbness and tingling when back is in certain positions.)   37 y.o. 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   Discussed the use of AI scribe software for clinical note transcription with the patient, who gave verbal consent to proceed.  History of Present Illness The patient, with a history of chronic back issues and prior disc damage, presents with a recent exacerbation of upper back pain, radiating to the chest, following a heavy lifting incident. The pain has been severe enough to cause vomiting and disrupt sleep, with the patient reporting only two to three hours of sleep per night. The patient has been managing the pain with a heating pad, Robaxin, and Zofran, but reports no significant improvement. The patient also notes numbness in the hands, which worsens with back spasms.  In addition to the back pain, the patient  woke up with a swollen left eye, which she initially thought was a pimple. The patient denies any visible bump or pus, but the swelling is significant and causing discomfort.  The patient also reports a history of intravenous substance use, but has been clean for five years. She is currently on a regimen of Suboxone, which she has been taking more of recently to help manage the back pain.  The patient's back pain is suspected to be due to a slipped disc, likely related to a prior injury. The patient has a history of disc damage from a work-related injury, and the current pain is  in a similar location. The patient also reports a history of nerve damage in the hands, which may be contributing to the current numbness.  The patient's eye swelling is of unknown origin, but the patient denies any signs of infection such as pus or fever. The patient has been managing the swelling with Benadryl.    Reviewed chart data: has HTN (hypertension); Anxiety; Chronic hepatitis C without hepatic coma (HCC); Insomnia; Tobacco user; History of drug use disorder; PTSD (post-traumatic stress disorder); Weight disorder; Idiopathic peripheral neuropathy; Chronic bronchitis (HCC); Elevated blood pressure reading; High risk medication use; and Opioid dependence with other opioid-induced disorder (HCC) on their problem list.      Objective:  Physical Exam           General Appearance:  Well Developed, Well Nourished, No Acute Distress by Limited Video Assessment Pulmonary:  No Respiratory Distress Apparent. Normal Work of Breathing.   Neurological:  Awake, Alert. No Obvious Focal Neurological Deficits or Cognitive Impairments.  Sensorium Seems Unclouded. Psychiatric:  Appropriate Mood, Pleasant Demeanor, Calm, Articulate, Good Mood  Results Reviewed:         ------------------------------------------------------ Attestation:  Today's Healthcare Provider Lula Olszewski, MD was located at office at [facility] at [facility] [address], [place] [address]. The patient was located at home. Today's Telemedicine visit was conducted via Video f after consent for telemedicine was obtained:  Video connection was never lost All video encounter participant identities and locations confirmed visually and verbally.  Signed: Lula Olszewski, MD 12/10/2023 6:21 PM

## 2023-12-20 ENCOUNTER — Ambulatory Visit: Payer: 59 | Attending: Internal Medicine | Admitting: Physical Therapy

## 2023-12-24 ENCOUNTER — Telehealth (INDEPENDENT_AMBULATORY_CARE_PROVIDER_SITE_OTHER): Payer: 59 | Admitting: Internal Medicine

## 2023-12-24 ENCOUNTER — Encounter: Payer: Self-pay | Admitting: Internal Medicine

## 2023-12-24 DIAGNOSIS — F11288 Opioid dependence with other opioid-induced disorder: Secondary | ICD-10-CM | POA: Diagnosis not present

## 2023-12-24 DIAGNOSIS — G609 Hereditary and idiopathic neuropathy, unspecified: Secondary | ICD-10-CM

## 2023-12-24 DIAGNOSIS — F1129 Opioid dependence with unspecified opioid-induced disorder: Secondary | ICD-10-CM

## 2023-12-24 DIAGNOSIS — M5442 Lumbago with sciatica, left side: Secondary | ICD-10-CM

## 2023-12-24 DIAGNOSIS — M546 Pain in thoracic spine: Secondary | ICD-10-CM | POA: Diagnosis not present

## 2023-12-24 DIAGNOSIS — M5441 Lumbago with sciatica, right side: Secondary | ICD-10-CM

## 2023-12-24 DIAGNOSIS — I10 Essential (primary) hypertension: Secondary | ICD-10-CM

## 2023-12-24 DIAGNOSIS — Z79899 Other long term (current) drug therapy: Secondary | ICD-10-CM

## 2023-12-24 DIAGNOSIS — F419 Anxiety disorder, unspecified: Secondary | ICD-10-CM

## 2023-12-24 DIAGNOSIS — G8929 Other chronic pain: Secondary | ICD-10-CM

## 2023-12-24 DIAGNOSIS — B182 Chronic viral hepatitis C: Secondary | ICD-10-CM

## 2023-12-24 MED ORDER — BUPRENORPHINE HCL-NALOXONE HCL 8-2 MG SL FILM: 1.0000 | ORAL_FILM | Freq: Every day | SUBLINGUAL | 0 refills | Status: DC

## 2023-12-24 MED ORDER — LISINOPRIL 40 MG PO TABS: 40.0000 mg | ORAL_TABLET | Freq: Every day | ORAL | 3 refills | Status: DC

## 2023-12-24 NOTE — Progress Notes (Signed)
 [facility]: [phone]   Virtual Medical Office Visit - Video Telemedicine   Patient:  Denise Rubio ([birth year])  MRN:   [id number]      Date:   12/24/2023  Today's Healthcare Provider: Bernardino KANDICE Cone, MD   Assessment & Plan  Assessment & Plan   Main reason for visit: Medication follow-up  Assessment & Plan Opioid dependence with other opioid-induced disorder [facility]) Assessment: continued excellent early response to Suboxone  maintenance therapy with improved functionality, stable mood, and decreased cravings.  Sleep and daily function significantly improved.  Recent urine positive for alcohol  but patient acknowledges single use of wine and commits to abstinence. No concerning behaviors for medication misuse. Actively participating in recovery support through NA meetings.  Plan: Continue Suboxone  8mg  films #14, take 1/2 film (4mg ) twice daily reminded risks of alcohol  use with Suboxone  including respiratory depression and upcoming new years Encouraged continued NA/AA meeting attendance Patient to submit photos of partially used strips for compliance monitoring Follow up in 2 weeks via video visit  Opioid Use Disorder Their opioid use disorder is managed with Suboxone , with reports of no cravings and significant improvement in energy and function. They are compliant with their medication regimen. We discussed the importance of avoiding alcohol  due to the high risk of adverse effects with Suboxone  and confirmed that the current dose is effective, with no changes needed. The plan is to continue Suboxone  8 mg daily, divided into two doses, send pictures of medications monthly, and follow up in two weeks. High risk medication use The plan is to continue Suboxone  8 mg daily, divided into two doses, send pictures of medications monthly, and follow up in two weeks. Acute bilateral thoracic back pain  Idiopathic peripheral  neuropathy  Anxiety  Chronic hepatitis C without hepatic coma (HCC) Hepatitis C, previously treated with Mavyret, requires follow-up with the previous provider to resume treatment and check current levels. We discussed the need for blood work to assess the current status before resuming medication. The plan includes contacting the previous provider for a Mavyret prescription and obtaining the necessary blood work to assess current levels. Primary hypertension Their hypertension is well-controlled with lisinopril , with no need for hydrochlorothiazide  in the past two weeks and stable blood pressure readings. We discussed continued monitoring and the potential need for hydrochlorothiazide  if blood pressure increases. The plan is to continue lisinopril , monitor blood pressure at home, and refill hydrochlorothiazide  as needed.  Opioid dependence with opioid-induced disorder (HCC)  Chronic bilateral low back pain with bilateral sciatica Chronic Back Pain Chronic back pain, secondary to a previous disc injury and nerve impingement, has shown improvement with current medications and exercises. We emphasized the importance of continuing stretching exercises and the use of gabapentin  and ibuprofen  for pain management, highlighting the risks of unmanaged pain, including potential exacerbation and the need for muscle relaxer shots, which previously caused significant downtime. The plan includes continuing gabapentin , using ibuprofen  as needed, performing a thoracic spine x-ray, and encouraging the continuation of stretching exercises.  General Health Maintenance   We discussed the importance of avoiding alcohol , especially with current medications, and noted the impact of a recent stomach bug on overall health. The plan is to avoid alcohol  consumption and continue monitoring overall health and wellness.  Follow-up   The follow-up plan includes managing Suboxone  in two weeks and sending in a blood pressure  medication refill in two weeks.     There are no diagnoses linked to this  encounter.   Treatment plan discussed and reviewed in detail. Explained medication safety and potential side effects.  Answered all patient questions and confirmed understanding and comfort with the plan. Encouraged patient to contact our office if they have any questions or concerns.  Agreed on patient coming for a sooner office visit if symptoms worsen, persist, or new symptoms develop. Discussed precautions in case of needing to visit the Emergency Department.   No follow-ups on file. No future appointments. Patient Care Team: Jesus Bernardino MATSU, MD as PCP - General (Internal Medicine)        Subjective:   Chief Complaint / Reason for Visit:  Medication follow-up   37 y.o. 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   Discussed the use of AI scribe software for clinical note transcription with the patient, who gave verbal consent to proceed.  History of Present Illness The patient, with a history of back injury, hepatitis, and hypertension, presents with a chief complaint of upper back pain. 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 The pain was exacerbated recently when she was moving items out of a building. The patient has been managing the pain with exercises found online, stretching, and medication. She reports that the pain is not currently inflamed.  The patient is currently on Suboxone  and Gabapentin  for pain management. She also takes ibuprofen  at night when the pain is bothersome. The patient reports that the medications have been helpful, and she has been able to resume  her regular schedule. She also reports that she has not had any cravings since starting Suboxone .  The patient also reports a recent bout of a stomach bug that affected her entire household. She has not yet resumed treatment for her hepatitis. She is also managing her hypertension with Lisinopril , and has Hydrochlorothiazide  on hand in case her blood pressure rises above 140/90. The patient reports that her blood pressure has been stable for the past two weeks.  The patient is due for an x-ray of her thoracic spine, which she plans to schedule around her child's wrestling practice. She has been managing her back pain and her child's schedule while working. She also reports that she has not consumed alcohol  for over a month.  Past Medical History - Slipped disc - Pinched nerve - Hepatitis C - High blood pressure  Medications - Suboxone  4 mg twice a day - Gabapentin  - Ibuprofen  as needed - Mavyret - Lisinopril  - Hydrochlorothiazide   Reviewed chart data: has HTN (hypertension); Anxiety; Chronic hepatitis C without hepatic coma (HCC); Insomnia; Tobacco user; History of drug use disorder; PTSD (post-traumatic stress disorder); Weight disorder; Idiopathic peripheral neuropathy; Chronic bronchitis (HCC); Elevated blood pressure reading; High risk medication use; and Opioid dependence with other opioid-induced disorder (HCC) on their problem list. Buprenorphine  HCl-Naloxone  HCl, Olopatadine  HCl, benzonatate, chlorhexidine, gabapentin , hydrochlorothiazide , levonorgestrel, lisinopril , methocarbamol, ondansetron , and promethazine-dextromethorphan       Objective:  Physical Exam           General Appearance:  Well Developed, Well Nourished, No Acute Distress by Limited Video Assessment Pulmonary:  No Respiratory Distress Apparent. Normal Work of Breathing.   Neurological:  Awake, Alert. No Obvious Focal Neurological Deficits or Cognitive Impairments.  Sensorium Seems Unclouded. Psychiatric:  Appropriate Mood, Pleasant Demeanor, Calm, Articulate, Good Mood  Results Reviewed:         ------------------------------------------------------ Attestation:  Today's Healthcare Provider Bernardino KANDICE Cone, MD was located at office at [facility] at [facility] [address] [place] [address]. The patient was located at work. Today's Telemedicine visit was conducted via Video for 0s after consent for telemedicine was obtained:  Video connection was never lost All video encounter participant identities and locations confirmed visually and verbally.  Signed: Bernardino KANDICE Cone, MD 12/24/2023 11:31 AM

## 2023-12-24 NOTE — Assessment & Plan Note (Addendum)
 Their hypertension is well-controlled with lisinopril , with no need for hydrochlorothiazide  in the past two weeks and stable blood pressure readings. We discussed continued monitoring and the potential need for hydrochlorothiazide  if blood pressure increases. The plan is to continue lisinopril , monitor blood pressure at home, and refill hydrochlorothiazide  as needed.

## 2023-12-24 NOTE — Assessment & Plan Note (Addendum)
 Assessment: continued excellent early response to Suboxone  maintenance therapy with improved functionality, stable mood, and decreased cravings.  Sleep and daily function significantly improved.  Recent urine positive for alcohol  but patient acknowledges single use of wine and commits to abstinence. No concerning behaviors for medication misuse. Actively participating in recovery support through NA meetings.  Plan: Continue Suboxone  8mg  films #14, take 1/2 film (4mg ) twice daily reminded risks of alcohol  use with Suboxone  including respiratory depression and upcoming new years Encouraged continued NA/AA meeting attendance Patient to submit photos of partially used strips for compliance monitoring Follow up in 2 weeks via video visit  Opioid Use Disorder Their opioid use disorder is managed with Suboxone , with reports of no cravings and significant improvement in energy and function. They are compliant with their medication regimen. We discussed the importance of avoiding alcohol  due to the high risk of adverse effects with Suboxone  and confirmed that the current dose is effective, with no changes needed. The plan is to continue Suboxone  8 mg daily, divided into two doses, send pictures of medications monthly, and follow up in two weeks.

## 2023-12-26 NOTE — Assessment & Plan Note (Signed)
 Hepatitis C, previously treated with Mavyret, requires follow-up with the previous provider to resume treatment and check current levels. We discussed the need for blood work to assess the current status before resuming medication. The plan includes contacting the previous provider for a Mavyret prescription and obtaining the necessary blood work to assess current levels.

## 2023-12-26 NOTE — Assessment & Plan Note (Signed)
 The plan is to continue Suboxone 8 mg daily, divided into two doses, send pictures of medications monthly, and follow up in two weeks.

## 2023-12-26 NOTE — Patient Instructions (Signed)
 VISIT SUMMARY:  During today's visit, we discussed your ongoing upper back pain, management of opioid use disorder, hypertension, and hepatitis C. We reviewed your current medications and their effectiveness, and we also talked about the importance of continuing your current health practices and avoiding alcohol .  YOUR PLAN:  -CHRONIC BACK PAIN: Chronic back pain is long-lasting pain in your back, often due to a previous injury. We recommend continuing your current medications (gabapentin  and ibuprofen ) and stretching exercises. You should also schedule a thoracic spine x-ray to further evaluate your condition.  -OPIOID USE DISORDER: Opioid use disorder is a condition where there is a dependency on opioid drugs. Your condition is well-managed with Suboxone , and you have reported no cravings. Continue taking Suboxone  8 mg daily, divided into two doses, and avoid alcohol . Please send pictures of your medications monthly and follow up in two weeks.  -HYPERTENSION: Hypertension is high blood pressure. Your blood pressure is well-controlled with lisinopril . Continue taking lisinopril , monitor your blood pressure at home, and use hydrochlorothiazide  if your blood pressure rises above 140/90.  -HEPATITIS C: Hepatitis C is a liver infection caused by the hepatitis C virus. You need to follow up with your previous provider to resume treatment with Mavyret and get blood work done to check your current levels.  -GENERAL HEALTH MAINTENANCE: We discussed the importance of avoiding alcohol , especially with your current medications, and noted the impact of a recent stomach bug on your overall health. Continue to avoid alcohol  and monitor your health and wellness.  INSTRUCTIONS:  Please schedule your thoracic spine x-ray around your child's wrestling practice. Follow up in two weeks for Suboxone  management and send in a blood pressure medication refill request in two weeks.

## 2024-01-03 ENCOUNTER — Other Ambulatory Visit: Payer: Self-pay | Admitting: Internal Medicine

## 2024-01-03 DIAGNOSIS — G629 Polyneuropathy, unspecified: Secondary | ICD-10-CM

## 2024-01-03 NOTE — Telephone Encounter (Signed)
 Copied from CRM [id number]. Topic: Clinical - Medication Refill >> Jan 03, 2024  9:51 AM Corean SAUNDERS wrote: Most Recent Primary Care Visit:  Provider: MORRISON, RYAN G  Department: [facility]  Visit Type: OFFICE VISIT  Date: 12/24/2023  Medication: gabapentin  (NEURONTIN ) 300 MG capsule  Has the patient contacted their pharmacy? Pharmacy states patient is out of re-fills. (Patient requesting a refill before storm.) (Agent: If no, request that the patient contact the pharmacy for the refill. If patient does not wish to contact the pharmacy document the reason why and proceed with request.) (Agent: If yes, when and what did the pharmacy advise?)  Is this the correct pharmacy for this prescription? Yes If no, delete pharmacy and type the correct one.  This is the patient's preferred pharmacy:  [facility] [address] ([address]),  - [address] [place]) [place] [address] Phone: [phone] Fax: [phone]   Has the prescription been filled recently? Yes  Is the patient out of the medication? Yes  Has the patient been seen for an appointment in the last year OR does the patient have an upcoming appointment? Yes  Can we respond through MyChart? No  Agent: Please be advised that Rx refills may take up to 3 business days. We ask that you follow-up with your pharmacy.

## 2024-01-07 MED ORDER — GABAPENTIN 300 MG PO CAPS: 300.0000 mg | ORAL_CAPSULE | Freq: Three times a day (TID) | ORAL | 0 refills | Status: DC

## 2024-01-08 ENCOUNTER — Telehealth (INDEPENDENT_AMBULATORY_CARE_PROVIDER_SITE_OTHER): Payer: 59 | Admitting: Internal Medicine

## 2024-01-08 ENCOUNTER — Encounter: Payer: Self-pay | Admitting: Internal Medicine

## 2024-01-08 VITALS — Wt 163.0 lb

## 2024-01-08 DIAGNOSIS — Z79899 Other long term (current) drug therapy: Secondary | ICD-10-CM | POA: Diagnosis not present

## 2024-01-08 DIAGNOSIS — F1991 Other psychoactive substance use, unspecified, in remission: Secondary | ICD-10-CM | POA: Diagnosis not present

## 2024-01-08 DIAGNOSIS — F1129 Opioid dependence with unspecified opioid-induced disorder: Secondary | ICD-10-CM

## 2024-01-08 DIAGNOSIS — Z803 Family history of malignant neoplasm of breast: Secondary | ICD-10-CM | POA: Insufficient documentation

## 2024-01-08 DIAGNOSIS — I10 Essential (primary) hypertension: Secondary | ICD-10-CM

## 2024-01-08 DIAGNOSIS — F11288 Opioid dependence with other opioid-induced disorder: Secondary | ICD-10-CM | POA: Diagnosis not present

## 2024-01-08 DIAGNOSIS — G629 Polyneuropathy, unspecified: Secondary | ICD-10-CM

## 2024-01-08 DIAGNOSIS — K2100 Gastro-esophageal reflux disease with esophagitis, without bleeding: Secondary | ICD-10-CM

## 2024-01-08 MED ORDER — GABAPENTIN 300 MG PO CAPS: 300.0000 mg | ORAL_CAPSULE | Freq: Three times a day (TID) | ORAL | 0 refills | Status: DC

## 2024-01-08 MED ORDER — BUPRENORPHINE HCL-NALOXONE HCL 8-2 MG SL FILM: 1.0000 | ORAL_FILM | Freq: Every day | SUBLINGUAL | 0 refills | Status: DC

## 2024-01-08 MED ORDER — PEPCID COMPLETE 10-800-165 MG PO CHEW: 1.0000 | CHEWABLE_TABLET | Freq: Two times a day (BID) | ORAL | 11 refills | Status: AC | PRN

## 2024-01-08 MED ORDER — OMEPRAZOLE 20 MG PO CPDR: 20.0000 mg | DELAYED_RELEASE_CAPSULE | Freq: Every day | ORAL | 0 refills | Status: DC

## 2024-01-08 MED ORDER — LISINOPRIL 40 MG PO TABS: 40.0000 mg | ORAL_TABLET | Freq: Every day | ORAL | 3 refills | Status: AC

## 2024-01-08 NOTE — Assessment & Plan Note (Signed)
 Reports in remission and well-controlled with suboxone  4 mg twice daily.

## 2024-01-08 NOTE — Assessment & Plan Note (Signed)
 Will arrange tomo due to strong family history early BRCA in 2  aunts

## 2024-01-08 NOTE — Progress Notes (Signed)
 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 High risk medication use Assessment: Risks and benefits were weighed and continued maintenance of the controlled substance prescription will be provided.   Relevant comorbid conditions include hepatitis C.  These factors are taken into account in the overall treatment plan to minimize potential interactions or complications.    Diversion Prevention, Regulatory Protocols, Adherence:  No evidence of misuse or diversion has been identified during this visit, and there has been no suspicion of such behavior.  Patient has  been briefed on our diversion prevention protocol, which is integral to our medication management strategy.  Explained and confirmed understanding of the importance of bringing medications in their original containers for verification, the option of submitting time-stamped medication photos via MyChart, and the necessity of routine urine drug screenings.   Terms have been agreed upon, signed by written contract, and this is in place to ensure the safe and effective use of medically necessary controlled substance prescriptions, and to fulfill our regulatory obligations.  Adherence to our prescribing agreement has been exemplary, with no indications of misuse or diversion. We will continue to monitor and support the agreed-upon treatment plan, ensuring compliance with safety protocols and regulatory  requirements.  Plan: Continued education about risks and benefits and safe use was also provided.  Importance of securing medication has been reviewed.  Risks Reviewed:  Due to sedatives being given, patient was instructed not to drive, operate heavy machinery, perform activities at heights, swimming or participation in water activities or provide baby-sitting services while on Pain, Sleep and Anxiety Medications;  Also recommended to not to take more than prescribed Pain, Sleep and Anxiety Medications, not to mix any such medicines with each other or alcohol.   PDMP reviewed during this encounter. Reviewed Urine Drug Screening Data in problem overview  Reviewed to determine if controlled substance contract needs updated. History of drug use disorder Reports staying sober taking medication(s) as rxd FH: breast cancer Will arrange tomo due to strong family history early BRCA in 2  aunts Opioid dependence with opioid-induced disorder (HCC) Reports in remission and well-controlled with suboxone  4 mg twice daily. Neuropathy Continue(s) with gabapentin  rf Primary hypertension Reporsts home reads good on blood pressure will continue(s) with same - btl so no-low risk pregnant  Gastroesophageal reflux disease with esophagitis without hemorrhage Reports severely uncontrolled gastroesophageal reflux disease long term will give Prilosec 1 month with as needed Pepcid  complete to reduce Tums over use   Treatment plan discussed and reviewed in detail. Explained medication safety and potential side effects.  Answered all patient questions and confirmed understanding and comfort with the plan. Encouraged patient to contact our office if they have any questions or concerns.  Agreed on patient coming for a sooner office visit if symptoms worsen, persist, or new symptoms develop. Discussed precautions in case of needing to visit the Emergency Department.   No follow-ups on file. Future  Appointments  Date Time  Provider Department Center  01/08/2024  8:00 AM Anthon Kins, MD [facility]   Patient Care Team: Anthon Kins, MD as PCP - General (Internal Medicine)        Subjective:   Chief Complaint / Reason for Visit:  Medication Management (Med check - pt states she needs a refill on Gabapentin  and Suboxone   - no concerns ), Anxiety, and Asthma   38 y.o. 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   Patient reports for follow-up for Suboxone  management primarily but also complains of treatment for severe GERD that has been chronic and she is eating a lot of Tums.  She feels the current dosing of Suboxone  is working well and she is taking it as prescribed really struggling to figure out how to upload the film counts pill counts.  She reports her blood pressure generally well-controlled with her current regimen of lisinopril  40 and occasional hydrochlorothiazide .  She is busy with being all on feels the Suboxone  is helping her when she did she reports not having any cravings at all anymore since taking the medicine as prescribed although she has a day late today due to appointments scheduled a little late but she was without meds this morning.  She expresses extreme concern about not having a mammogram yeah and her aunts having had breast cancer in her early 50s and she does not seem to be aware of what genetic testing was done.  She shows very empty bottle on the video call of the Suboxone  did demonstrate she is taking.  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 Buprenorphine  HCl-Naloxone  HCl, Olopatadine  HCl, benzonatate, chlorhexidine, gabapentin , hydrochlorothiazide , levonorgestrel, lisinopril , methocarbamol, ondansetron , and promethazine-dextromethorphan       Objective:  Physical Exam         Limtited exam due to poor video quality. She smoking  General Appearance:  Well Developed, Well Nourished, No Acute Distress by Limited Video Assessment Pulmonary:  No Respiratory Distress Apparent. Normal Work of Breathing.   Neurological:  Awake, Alert. No Obvious Focal Neurological Deficits or Cognitive Impairments.  Sensorium Seems Unclouded. Psychiatric:  Appropriate Mood, Pleasant Demeanor, Calm, Articulate, Good Mood  Results Reviewed:         ------------------------------------------------------ Attestation:  Today's Healthcare Provider Anthon Kins, MD was located at office at [facility] [address] [place] [address]. The patient was located at home. Today's Telemedicine visit was conducted via Video after consent for telemedicine was obtained:  Video connection was never lost All video encounter participant identities and locations confirmed visually and verbally.  Signed: Anthon Kins, MD 01/08/2024 7:58 AM

## 2024-01-08 NOTE — Assessment & Plan Note (Signed)
 Assessment: Risks and benefits were weighed and continued maintenance of the controlled substance prescription will be provided.   Relevant comorbid conditions include hepatitis C.  These factors are taken into account in the overall treatment plan to minimize potential interactions or complications.    Diversion Prevention, Regulatory Protocols, Adherence:  No evidence of misuse or diversion has been identified during this visit, and there has been no suspicion of such behavior.  Patient has  been briefed on our diversion prevention protocol, which is integral to our medication management strategy.  Explained and confirmed understanding of the importance of bringing medications in their original containers for verification, the option of submitting time-stamped medication photos via MyChart, and the necessity of routine urine drug screenings.   Terms have been agreed upon, signed by written contract, and this is in place to ensure the safe and effective use of medically necessary controlled substance prescriptions, and to fulfill our regulatory obligations.  Adherence to our prescribing agreement has been exemplary, with no indications of misuse or diversion. We will continue to monitor and support the agreed-upon treatment plan, ensuring compliance with safety protocols and regulatory requirements.  Plan: Continued education about risks and benefits and safe use was also provided.  Importance of securing medication has been reviewed.  Risks Reviewed:  Due to sedatives being given, patient was instructed not to drive, operate heavy machinery, perform activities at heights, swimming or participation in water activities or provide baby-sitting services while on Pain, Sleep and Anxiety Medications;  Also recommended to not to take more than prescribed Pain, Sleep and Anxiety Medications, not to mix any such medicines with each other or alcohol.   PDMP reviewed during this encounter. Reviewed Urine Drug  Screening Data in problem overview  Reviewed to determine if controlled substance contract needs updated.

## 2024-01-08 NOTE — Assessment & Plan Note (Signed)
 Reporsts home reads good on blood pressure will continue(s) with same - btl so no-low risk pregnant

## 2024-01-08 NOTE — Patient Instructions (Signed)
 It was a pleasure seeing you today! Your health and satisfaction are our top priorities.  Scherrie Curt, MD  Your Providers PCP: Anthon Kins, MD,  [phone]) Referring Provider: Anthon Kins, MD,  [phone])     NEXT STEPS: [x]  Early Intervention: Schedule sooner appointment, call our on-call services, or go to emergency room if there is any significant Increase in pain or discomfort New or worsening symptoms Sudden or severe changes in your health [x]  Flexible Follow-Up: We recommend a No follow-ups on file. for optimal routine care. This allows for progress monitoring and treatment adjustments. [x]  Preventive Care: Schedule your annual preventive care visit! It's typically covered by insurance and helps identify potential health issues early. [x]  Lab & X-ray Appointments: Incomplete tests scheduled today, or call to schedule. X-rays: [facility] (M-F, 8:30am-noon or 1pm-5pm). [x]  Medical Information Release: Sign a release form at front desk to obtain relevant medical information we don't have.  MAKING THE MOST OF OUR FOCUSED 20 MINUTE APPOINTMENTS: [x]   Clearly state your top concerns at the beginning of the visit to focus our discussion [x]   If you anticipate you will need more time, please inform the front desk during scheduling - we can book multiple appointments in the same week. [x]   If you have transportation problems- use our convenient video appointments or ask about transportation support. [x]   We can get down to business faster if you use MyChart to update information before the visit and submit non-urgent questions before your visit. Thank you for taking the time to provide details through MyChart.  Let our nurse know and she can import this information into your encounter documents.  Arrival and Wait Times: [x]   Arriving on time ensures that everyone receives prompt attention. [x]   Early morning (8a) and afternoon (1p) appointments tend to  have shortest wait times. [x]   Unfortunately, we cannot delay appointments for late arrivals or hold slots during phone calls.  Getting Answers and Following Up [x]   Simple Questions & Concerns: For quick questions or basic follow-up after your visit, reach us  at [phone] or MyChart messaging. [x]   Complex Concerns: If your concern is more complex, scheduling an appointment might be best. Discuss this with the staff to find the most suitable option. [x]   Lab & Imaging Results: We'll contact you directly if results are abnormal or you don't use MyChart. Most normal results will be on MyChart within 2-3 business days, with a review message from Dr. Boston Byers. Haven't heard back in 2 weeks? Need results sooner? Contact us  at [phone]. [x]   Referrals: Our referral coordinator will manage specialist referrals. The specialist's office should contact you within 2 weeks to schedule an appointment. Call us  if you haven't heard from them after 2 weeks.  Staying Connected [x]   MyChart: Activate your MyChart for the fastest way to access results and message us . See the last page of this paperwork for instructions on how to activate.  Bring to Your Next Appointment [x]   Medications: Please bring all your medication bottles to your next appointment to ensure we have an accurate record of your prescriptions. [x]   Health Diaries: If you're monitoring any health conditions at home, keeping a diary of your readings can be very helpful for discussions at your next appointment.  Billing [x]   X-ray & Lab Orders: These are billed by separate companies. Contact the invoicing company directly for questions or concerns. [x]   Visit Charges: Discuss any billing inquiries with our administrative services  team.  Your Satisfaction Matters [x]   Share Your Experience: We strive for your satisfaction! If you have any complaints, or preferably compliments, please let Dr. Boston Byers know directly or contact our Practice  Administrators, Olinda Bertrand or [employer], by asking at the front desk.   Reviewing Your Records [x]   Review this early draft of your clinical encounter notes below and the final encounter summary tomorrow on MyChart after its been completed.  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 At first, take only 0.25  films (2 mg opioid) each hour until minimum effective dose reached to control cravings, up to 2 films total in day max.  Dispense: 14 each; Refill: 0  Neuropathy -     Gabapentin ; Take 1 capsule (300 mg total) by mouth 3 (three) times daily.  Dispense: 90 capsule; Refill: 0  Primary hypertension -     Lisinopril ; Take 1 tablet (40 mg total) by mouth daily. History of tubal but can't take if pregnant.  Dispense: 90 tablet; Refill: 3  Gastroesophageal reflux disease with esophagitis without hemorrhage -     Omeprazole ; Take 1 capsule (20 mg total) by mouth daily.  Dispense: 30 capsule; Refill: 0 -     Pepcid  Complete; Chew 1 tablet by mouth 2 (two) times daily as needed.  Dispense: 100 tablet; Refill: 11

## 2024-01-08 NOTE — Telephone Encounter (Signed)
 Copied from CRM [id number]. Topic: Appointments - Appointment Scheduling >> Jan 08, 2024  8:25 AM Barney Boozer wrote: Patient/patient representative is calling to schedule an appointment. Refer to attachments for appointment information.  >> Jan 08, 2024  8:29 AM Barney Boozer wrote: Patient states she's suppose to have a virtual video visit with Scherrie Curt MD 2 weeks from today in regards to her medication, but the earliest I could get her scheduled in would be the beginning of February. Patient states that's too far out and requested that I sent a message over.

## 2024-01-08 NOTE — Assessment & Plan Note (Signed)
 Reports staying sober taking medication(s) as rxd

## 2024-01-22 ENCOUNTER — Encounter: Payer: Self-pay | Admitting: Internal Medicine

## 2024-01-22 ENCOUNTER — Telehealth: Payer: 59 | Admitting: Internal Medicine

## 2024-01-22 VITALS — BP 135/96

## 2024-01-22 DIAGNOSIS — Z720 Tobacco use: Secondary | ICD-10-CM

## 2024-01-22 DIAGNOSIS — F1129 Opioid dependence with unspecified opioid-induced disorder: Secondary | ICD-10-CM | POA: Diagnosis not present

## 2024-01-22 DIAGNOSIS — Z79899 Other long term (current) drug therapy: Secondary | ICD-10-CM | POA: Diagnosis not present

## 2024-01-22 DIAGNOSIS — Z5181 Encounter for therapeutic drug level monitoring: Secondary | ICD-10-CM

## 2024-01-22 DIAGNOSIS — I10 Essential (primary) hypertension: Secondary | ICD-10-CM

## 2024-01-22 MED ORDER — BUPRENORPHINE HCL-NALOXONE HCL 8-2 MG SL FILM: 1.0000 | ORAL_FILM | Freq: Every day | SUBLINGUAL | 0 refills | Status: DC

## 2024-01-22 MED ORDER — CHLORTHALIDONE 25 MG PO TABS: 25.0000 mg | ORAL_TABLET | Freq: Every day | ORAL | 3 refills | Status: AC

## 2024-01-22 NOTE — Assessment & Plan Note (Addendum)
Assessment: Risks and benefits were weighed and continued maintenance of the controlled substance prescription will be provided.   Continued education about risks and benefits and safe use was also provided.  Importance of securing medications has been reviewed.  Relevant comorbid conditions include smoking, anxiety.  These factors are taken into account in the overall treatment plan to minimize potential interactions or complications.    PDMP reviewed during this encounter.  Reviewed Urine Drug Screening Data in problem overview  Reviewed to determine if controlled substance contract needs updated.   Adherence:  No evidence of misuse or diversion has been identified during this visit, and there is no suspicion of such behavior.  Patient has  been briefed on our diversion prevention protocol, which is integral to our medication management strategy.  Explained and confirmed understanding of the importance of bringing medications in their original containers for verification, the option of submitting time-stamped medication photos via MyChart, and the necessity of routine urine drug screenings.   Terms were agreed upon, signed by written contract, and this is in place to ensure the safe and effective use of medically necessary controlled substance prescriptions, and to fulfill our regulatory obligations.  Adherence to our prescribing agreement has been exemplary, with no indications of misuse or diversion. We will continue to monitor and support the agreed-upon treatment plan, ensuring compliance with safety protocols and regulatory requirements.

## 2024-01-22 NOTE — Progress Notes (Unsigned)
[facility]: [phone]   Virtual Medical Office Visit - Video Telemedicine   Patient:  Denise Rubio (1986/03/30)  MRN:   [id number]      Date:   01/22/2024  Today's Healthcare Provider: Lula Olszewski, MD   Assessment & Plan  Assessment & Plan   Main reason for visit: Virtual Medication Refill and Opioid Problem  Assessment & Plan Opioid dependence with opioid-induced disorder (HCC) Appears well.  Currently on Suboxone (buprenorphine) for opioid dependence, which helps maintain stability and reduces cigarette smoking. No relapse reported. Continue medication as prescribed and send in photos of medication halfway through the prescription for compliance monitoring. Follow up in two weeks.  Will extended prescription when we start receiving regular random pill count photos.  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 Currently taking lisinopril 40 mg and hydrochlorothiazide 25 mg, but diastolic pressure remains elevated. Hydrochlorothiazide will be switched to chlorthalidone, a stronger diuretic. Discussed potential for nausea if taken on an empty stomach, but reassured it should not be significant. Continue lisinopril 40 mg. Monitor blood pressure at home and report readings. Follow up in two weeks. High risk medication use Assessment: Risks and benefits were weighed and continued maintenance of the controlled substance prescription will be provided.   Continued education about risks and benefits and safe use was also provided.  Importance of securing medications has been reviewed.  Relevant comorbid conditions include smoking, anxiety.  These factors are taken into account in the overall treatment plan to minimize potential  interactions or complications.    PDMP reviewed during this encounter.  Reviewed Urine Drug Screening Data in problem overview  Reviewed to determine if controlled substance contract needs updated.   Adherence:  No evidence of misuse or diversion has been identified during this visit, and there is no suspicion of such behavior.  Patient has  been briefed on our diversion prevention protocol, which is integral to our medication management strategy.  Explained and confirmed understanding of the importance of bringing medications in their original containers for verification, the option of submitting time-stamped medication photos via MyChart, and the necessity of routine urine drug screenings.   Terms were agreed upon, signed by written contract, and this is in place to ensure the safe and effective use of medically necessary controlled substance prescriptions, and to fulfill our regulatory obligations.  Adherence to our prescribing agreement has been exemplary, with no indications of misuse or diversion. We will continue to monitor and support the agreed-upon treatment plan, ensuring compliance with safety protocols and regulatory requirements.  Tobacco user Smoking Cessation Reports a reduction in cigarette smoking, attributed to the stabilizing effect of Suboxone. Declined additional support like patches, gums, or lozenges, and occasionally uses a vape device. Continue current smoking cessation efforts and offer support as needed.    General Health Maintenance Expressed interest in finding a part-time job to stay active and avoid idle time, which is detrimental to recovery. Encourage finding a part-time job, engage in regular physical activity, and continue to manage idle time effectively.  Follow-up Schedule a follow-up appointment in two weeks. Call to schedule immediately and inform staff of the need for overbooking if necessary.      Subjective:   Chief Complaint / Reason for Visit:   Virtual Medication Refill and  Opioid Problem   38 y.o. 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   History of Present Illness The patient presents for follow-up of hypertension management.  She has been monitoring her blood pressure at home, although it has been a few days since the last check. She recently acquired a new blood pressure monitor and initially recorded a reading of 140/112 mmHg, which was concerning. Upon rechecking, the blood pressure was 135/96 mmHg. She is currently taking lisinopril 40 mg and hydrochlorothiazide 25 mg daily, and she takes her medication every night.  She is on Suboxone for substance use disorder and is required to have follow-up appointments every two weeks due to her medication regimen. She has been experiencing difficulties with scheduling these appointments consistently.  She is attempting to reduce cigarette smoking, noting that the medication helps her feel less on edge and reduces the urge to smoke. She has a vape device but needs to charge it[place], aiding in her reduction efforts.  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 Idle time is not beneficial for her recovery and she misses working. She has children who are in school from 8 AM to 4 PM.  Past Medical History - High blood pressure  Reviewed chart data: has HTN (hypertension); Anxiety; Chronic hepatitis C without hepatic coma (HCC); Insomnia; Tobacco user; History of drug use disorder; PTSD (post-traumatic stress disorder); Weight disorder; Idiopathic peripheral  neuropathy; Chronic bronchitis (HCC); Elevated blood pressure reading; High risk medication use; Opioid dependence with other opioid-induced disorder (HCC); FH: breast cancer; and Encounter for monitoring Suboxone maintenance therapy on their problem list. Buprenorphine HCl-Naloxone HCl, Olopatadine HCl, benzonatate, chlorhexidine, chlorthalidone, famotidine-calcium carbonate-magnesium hydroxide, gabapentin, hydrochlorothiazide, levonorgestrel, lisinopril, methocarbamol, omeprazole, ondansetron, and promethazine-dextromethorphan  Medications - Lisinopril 40 mg - Hydrochlorothiazide 25 mg - Suboxone / Buprenorphine     Objective:  Physical Exam         Physical Exam VITALS: BP- 135/96, 140/112 checked on video   General Appearance:  Well Developed, Well Nourished, No Acute Distress by Limited Video Assessment Pulmonary:  No Respiratory Distress Apparent. Normal Work of Breathing.   Neurological:  Awake, Alert. No Obvious Focal Neurological Deficits or Cognitive Impairments.  Sensorium Seems Unclouded. Psychiatric:  Appropriate Mood, Pleasant Demeanor, Calm, Articulate, Good Mood  Results Reviewed:         ------------------------------------------------------ Attestation:  Today's Healthcare Provider Lula Olszewski, MD was located at office at [facility] [address], [place] [address]. The patient was located at home. Today's Telemedicine visit was conducted via Video after consent for telemedicine was obtained:  Video connection was never lost All video encounter participant identities and locations confirmed visually and verbally.  Signed: Lula Olszewski, MD 01/23/2024 7:29 AM

## 2024-01-23 NOTE — Assessment & Plan Note (Signed)
Hypertension Recent home blood pressure readings are 140/112 and 135/96. Currently taking lisinopril 40 mg and hydrochlorothiazide 25 mg, but diastolic pressure remains elevated. Hydrochlorothiazide will be switched to chlorthalidone, a stronger diuretic. Discussed potential for nausea if taken on an empty stomach, but reassured it should not be significant. Continue lisinopril 40 mg. Monitor blood pressure at home and report readings. Follow up in two weeks.

## 2024-01-23 NOTE — Assessment & Plan Note (Signed)
Smoking Cessation Reports a reduction in cigarette smoking, attributed to the stabilizing effect of Suboxone. Declined additional support like patches, gums, or lozenges, and occasionally uses a vape device. Continue current smoking cessation efforts and offer support as needed.

## 2024-01-23 NOTE — Patient Instructions (Signed)
VISIT SUMMARY:  You came in today for a follow-up on your hypertension management. We discussed your recent blood pressure readings, your current medications, and your efforts to reduce cigarette smoking. We also talked about your interest in finding a part-time job to stay active and support your recovery.  YOUR PLAN:  -HYPERTENSION: Hypertension means high blood pressure. Your recent readings at home were 140/112 and 135/96, which are still high. We will switch your hydrochlorothiazide to chlorthalidone, a stronger diuretic, and continue your lisinopril 40 mg. Please monitor your blood pressure at home and report your readings. We will follow up in two weeks.  -SUBSTANCE USE DISORDER (OPIOID DEPENDENCE): Substance use disorder means dependence on substances like opioids. You are currently on Suboxone, which helps maintain stability and reduces your urge to smoke. Continue taking your medication as prescribed and send in photos of your medication halfway through the prescription for compliance monitoring. We will follow up in two weeks.  -SMOKING CESSATION: Smoking cessation means stopping smoking. You have reduced your cigarette smoking, thanks to the stabilizing effect of Suboxone. You declined additional support like patches or gums but occasionally use a vape device. Continue your current efforts to quit smoking, and we are here to support you as needed.  -GENERAL HEALTH MAINTENANCE: You expressed interest in finding a part-time job to stay active and avoid idle time, which is important for your recovery. We encourage you to find a part-time job, engage in regular physical activity, and manage your idle time effectively.  INSTRUCTIONS:  Please schedule a follow-up appointment in two weeks. Call to schedule immediately and inform the staff if overbooking is necessary.

## 2024-01-28 ENCOUNTER — Encounter: Payer: Self-pay | Admitting: Internal Medicine

## 2024-02-05 ENCOUNTER — Encounter: Payer: Self-pay | Admitting: Internal Medicine

## 2024-02-05 ENCOUNTER — Telehealth: Payer: 59 | Admitting: Internal Medicine

## 2024-02-05 VITALS — Ht 61.0 in | Wt 163.0 lb

## 2024-02-05 DIAGNOSIS — Z79899 Other long term (current) drug therapy: Secondary | ICD-10-CM

## 2024-02-05 DIAGNOSIS — G629 Polyneuropathy, unspecified: Secondary | ICD-10-CM

## 2024-02-05 DIAGNOSIS — F17200 Nicotine dependence, unspecified, uncomplicated: Secondary | ICD-10-CM

## 2024-02-05 DIAGNOSIS — Z5181 Encounter for therapeutic drug level monitoring: Secondary | ICD-10-CM

## 2024-02-05 DIAGNOSIS — F1129 Opioid dependence with unspecified opioid-induced disorder: Secondary | ICD-10-CM

## 2024-02-05 MED ORDER — GABAPENTIN 300 MG PO CAPS: 300.0000 mg | ORAL_CAPSULE | Freq: Three times a day (TID) | ORAL | 2 refills | Status: DC

## 2024-02-05 MED ORDER — BUPRENORPHINE HCL-NALOXONE HCL 8-2 MG SL FILM
1.0000 | ORAL_FILM | Freq: Every day | SUBLINGUAL | 0 refills | Status: DC
Start: 2024-02-05 — End: 2024-04-03

## 2024-02-05 MED ORDER — BUPROPION HCL ER (SR) 100 MG PO TB12: 100.0000 mg | ORAL_TABLET | Freq: Every morning | ORAL | 3 refills | Status: DC

## 2024-02-05 NOTE — Assessment & Plan Note (Signed)
Seems to be doing well we will extend 1 month supply

## 2024-02-05 NOTE — Progress Notes (Signed)
==============================  [facility]: [phone]   --  Virtual Video Medical Office Visit --  Patient: Denise Rubio      Age: 38 y.o.       Sex:  female  Date:   02/05/2024 Today's Healthcare Provider: Lula Olszewski, MD  ==============================  CHIEF COMPLAINT: Medication Follow up (Pt meeting with PCP for medication follow up; )  SUBJECTIVE: Chart reviewed: has HTN (hypertension); Anxiety; Chronic hepatitis C without hepatic coma (HCC); Insomnia; Tobacco user; History of drug use disorder; PTSD (post-traumatic stress disorder); Weight disorder; Idiopathic peripheral neuropathy; Chronic bronchitis (HCC); Elevated blood pressure reading; High risk medication use; Opioid dependence with other opioid-induced disorder (HCC); FH: breast cancer; and Encounter for monitoring Suboxone maintenance therapy on their problem list..  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 Verbally reviewed with patient:    This was a video visit where we discussed her ongoing efforts at recovery.  She has been remaining sober she reports although she does use occasional weight Cannabis still.  She also is still smoking and wants to come back but her husband is having bad experience with Chantix so she is unsure if she wants that however she was willing to consider Wellbutrin afterwards described.  She has been doing some NA and AA meetings but reports that they can be clicky and sometimes bad experience.  I she had to pull off the road for the visit because she had to take her kids to school but she had dropped them off already she reports the Suboxone is working really well and she is having almost no cravings for opioids  although when she got sick with the flu last week there was a little bit and she had some concerns about taking promethazine that she was prescribed which I reassured her not to worry about she said it does not trigger her.  She also requests refill on gabapentin which she takes for her neuropathy   Current Outpatient Medications on File Prior to Visit  Medication Sig   oseltamivir (TAMIFLU) 75 MG capsule Take 75 mg by mouth 2 (two) times daily.   VENTOLIN HFA 108 (90 Base) MCG/ACT inhaler Inhale 2 puffs into the lungs every 4 (four) hours as needed for shortness of breath.   benzonatate (TESSALON) 200 MG capsule Take 200 mg by mouth 3 (three) times daily as needed. (Patient not taking: Reported on 01/22/2024)   chlorhexidine (PERIDEX) 0.12 % solution SWISH 15 ML (1 CAPFUL) IN THE MOUTH FOR 30 SECONDS, THEN EXPECTORATE TWICE DAILY. START THE DAY BEFORE SURGERY AND CONTINUE THE DAY AFTER SURGERY   chlorthalidone (HYGROTON) 25 MG tablet Take 1 tablet (25 mg total) by mouth daily. Take in am, replaces hydrochloroTHIAZIDE.   famotidine-calcium carbonate-magnesium hydroxide (PEPCID COMPLETE) 10-800-165 MG chewable tablet Chew 1 tablet by mouth 2 (two) times daily as needed.   hydrochlorothiazide (HYDRODIURIL) 25 MG tablet Take 1 tablet (25 mg total) by mouth daily. To start, take half tablet daily, only if home blood pressures are over 140/90   levonorgestrel (MIRENA) 20 MCG/DAY IUD by Intrauterine route.   lisinopril (ZESTRIL) 40 MG tablet Take 1 tablet (40 mg total) by mouth daily. History of tubal but can't take if pregnant.   methocarbamol (ROBAXIN) 500 MG tablet Take 500 mg by mouth 4 (four)  times daily as needed.   Olopatadine HCl 0.2 % SOLN Apply 2 drops to eye daily at 6 (six) AM.   omeprazole (PRILOSEC) 20 MG capsule Take 1 capsule (20 mg total) by mouth daily.   ondansetron (ZOFRAN-ODT) 4 MG disintegrating tablet Take 4 mg by mouth 3 (three) times daily as needed.    promethazine-dextromethorphan (PROMETHAZINE-DM) 6.25-15 MG/5ML syrup Take 5 mLs by mouth every 4 (four) hours as needed.   No current facility-administered medications on file prior to visit.   Medications Discontinued During This Encounter  Medication Reason   Buprenorphine HCl-Naloxone HCl (SUBOXONE) 8-2 MG FILM Reorder   gabapentin (NEURONTIN) 300 MG capsule Reorder    OBJECTIVE: Objective  General Appearance:  No Acute Distress by Limited Video Assessment Pulmonary:  No Respiratory Distress Apparent. Normal Work of Breathing.   Neurological:  Awake, Alert. No Obvious Focal Neurological Deficits or Cognitive Impairments.  Sensorium Seems Unclouded. Psychiatric:  Appropriate Mood, Pleasant Demeanor, Calm, Articulate, Good Mood  Office Visit on 11/18/2023  Component Date Value   Buprenorphine 11/18/2023 Comment    Ethyl Glucuronide 11/18/2023 Comment    Amphetamines 11/18/2023 Negative    Barbiturates 11/18/2023 Negative    Benzodiazepines 11/18/2023 Negative    Cocaine Metabolite 11/18/2023 Negative    Phencyclidine (PCP) 11/18/2023 Negative    Marijuana MTB (THC) 11/18/2023 Comment    6-Acetylmorphine 11/18/2023 Negative    Opiates 11/18/2023 Negative    Oxycodone 11/18/2023 Negative    Tapentadol 11/18/2023 Negative    Fentanyl 11/18/2023 Negative    Methadone 11/18/2023 Negative    Propoxyphene 11/18/2023 Negative    Tramadol 11/18/2023 Negative    Carisoprodol 11/18/2023 Negative    Gabapentin 11/18/2023 Negative    Creatinine 11/18/2023 100    Urine pH 11/18/2023 7.5    Nitrites 11/18/2023 Negative    BUPRENORPHINE 11/18/2023 ++POSITIVE++ (A)    Buprenorphine/CR 11/18/2023 35    Norbuprenorphine/CR 11/18/2023 200    Norbup/Bup Ratio 11/18/2023 5.71    OPIATE ANTAGONIST 11/18/2023 ++POSITIVE++ (A)    Naloxone 11/18/2023 33    CANNABINOIDS 11/18/2023 ++POSITIVE++ (A)    THC/CR Ratio 11/18/2023 92    ETHANOL BIOMARKERS 11/18/2023 ++POSITIVE++ (A)    Ethyl  Glucuronide 11/18/2023 >50000    Ethyl Sulfate 11/18/2023 7,878   Admission on 02/19/2023, Discharged on 02/19/2023  Component Date Value   WBC 02/19/2023 11.6 (H)    RBC 02/19/2023 4.75    Hemoglobin 02/19/2023 15.1 (H)    HCT 02/19/2023 45.7    MCV 02/19/2023 96.2    MCH 02/19/2023 31.8    MCHC 02/19/2023 33.0    RDW 02/19/2023 12.7    Platelets 02/19/2023 302    nRBC 02/19/2023 0.0    Neutrophils Relative % 02/19/2023 68    Neutro Abs 02/19/2023 7.8 (H)    Lymphocytes Relative 02/19/2023 20    Lymphs Abs 02/19/2023 2.3    Monocytes Relative 02/19/2023 8    Monocytes Absolute 02/19/2023 0.9    Eosinophils Relative 02/19/2023 4    Eosinophils Absolute 02/19/2023 0.5    Basophils Relative 02/19/2023 0    Basophils Absolute 02/19/2023 0.0    Immature Granulocytes 02/19/2023 0    Abs Immature Granulocytes 02/19/2023 0.05    Sodium 02/19/2023 133 (L)    Potassium 02/19/2023 4.5    Chloride 02/19/2023 100    CO2 02/19/2023 22    Glucose, Bld 02/19/2023 77    BUN 02/19/2023 10    Creatinine, Ser 02/19/2023 0.70    Calcium 02/19/2023 9.1    Total  Protein 02/19/2023 7.6    Albumin 02/19/2023 3.6    AST 02/19/2023 62 (H)    ALT 02/19/2023 69 (H)    Alkaline Phosphatase 02/19/2023 86    Total Bilirubin 02/19/2023 1.1    GFR, Estimated 02/19/2023 >60    Anion gap 02/19/2023 11   US BREAST LTD UNI LEFT INC AXILLA Result Date: 02/19/2023 CLINICAL DATA:  Patient presents to the emergency room for possible left breast infection/abscess. EXAM: ULTRASOUND OF THE LEFT BREAST COMPARISON:  Previous exam(s). FINDINGS: Note that this exam was performed through the emergency department as there was no breast imaging radiologist present for the exam. Targeted ultrasound is performed, showing a superficial complex fluid collection within the thickened skin of the area of concern over the upper central periareolar region. This collection as a small component extending deeper into the breast tissue  and may be an evolving abscess. IMPRESSION: Superficial complex fluid collection within the skin of the upper central periareolar region of the left breast. In the proper clinical setting, this is compatible with superficial infection/mastitis with small deeper component possibly evolving abscess. RECOMMENDATION: Recommend initiating 7-10 day course of antibiotics as well as further complete diagnostic imaging workup with mammogram/ultrasound at the [facility]. I have discussed the findings and recommendations with the patient. If applicable, a reminder letter will be sent to the patient regarding the next appointment. BI-RADS CATEGORY  0: Incomplete. Need additional imaging evaluation and/or prior mammograms for comparison. Electronically Signed   By: Elberta Fortis M.D.   On: 02/19/2023 19:34      Assessment & Plan Opioid dependence with opioid-induced disorder (HCC) Doing well Continue taking as prescribed Refills x 1 month due to excellent adherence High risk medication use Suboxone. PDMP reviewed during this encounter.  phe did have good pill count submission last month so we will move to monthly supplies and encouraged her to continue doing I encouraged her to discontinue cannabis and told her that if she is able to provide drug screen showing thc negative urine drug screen  it potentially upgrade  to 2 months suboxoneat a time but until then ongoing for 1 monthly checkins and pill counts Extended conversation again about importance of avoiding alcohol while taking Suboxone due to sedating effects especially when combined Encounter for monitoring Suboxone maintenance therapy Seems to be doing well we will extend 1 month supply Smoker She agreed to try Wellbutrin after initial hesitation because her husband is having bad experience with Chantix I also encouraged her to try this and Neuropathy Refill gabapentin as requested we will get that is the same discouraged the use of  cannabis      Orders Placed During this Encounter:  No orders of the defined types were placed in this encounter.  Meds ordered this encounter  Medications   buPROPion ER (WELLBUTRIN SR) 100 MG 12 hr tablet    Sig: Take 1 tablet (100 mg total) by mouth in the morning.    Dispense:  90 tablet    Refill:  3   Buprenorphine HCl-Naloxone HCl (SUBOXONE) 8-2 MG FILM    Sig: Place 1 Film under the tongue daily at 6 (six) AM. At first, take only 0.25  films (2 mg opioid) each hour until minimum effective dose reached to control cravings, up to 2 films total in day max.    Dispense:  30 each    Refill:  0   gabapentin (NEURONTIN) 300 MG capsule    Sig: Take 1 capsule (300 mg  total) by mouth 3 (three) times daily.    Dispense:  90 capsule    Refill:  2     ------------------------------------------------------ Attestation:  Today's Healthcare Provider Lula Olszewski, MD was located at office at [facility] [address], [place] [address].  The patient was located at on side of road in car, she pulled over for call. All video encounter participant identities and locations confirmed visually and verbally.Today's Telemedicine visit was conducted via synchronous Video after consent for telemedicine was obtained:  Video connection was never lost    This document was transcribed and resynthesized, in part, by artificial intelligence ([employer]bridge) using HIPAA-compliant recording of the clinical interaction;   We have discussed the our use of AI scribe software for clinical note transcription with the patient, who has given verbal consent to proceed.

## 2024-02-05 NOTE — Assessment & Plan Note (Addendum)
Suboxone. PDMP reviewed during this encounter.  phe did have good pill count submission last month so we will move to monthly supplies and encouraged her to continue doing I encouraged her to discontinue cannabis and told her that if she is able to provide drug screen showing thc negative urine drug screen  it potentially upgrade  to 2 months suboxoneat a time but until then ongoing for 1 monthly checkins and pill counts Extended conversation again about importance of avoiding alcohol while taking Suboxone due to sedating effects especially when combined

## 2024-03-04 ENCOUNTER — Ambulatory Visit: Payer: 59 | Admitting: Internal Medicine

## 2024-03-26 ENCOUNTER — Ambulatory Visit (HOSPITAL_COMMUNITY): Payer: Self-pay | Admitting: Psychiatry

## 2024-04-03 ENCOUNTER — Telehealth (INDEPENDENT_AMBULATORY_CARE_PROVIDER_SITE_OTHER): Admitting: Internal Medicine

## 2024-04-03 ENCOUNTER — Encounter: Payer: Self-pay | Admitting: Internal Medicine

## 2024-04-03 DIAGNOSIS — F419 Anxiety disorder, unspecified: Secondary | ICD-10-CM | POA: Diagnosis not present

## 2024-04-03 DIAGNOSIS — F1193 Opioid use, unspecified with withdrawal: Secondary | ICD-10-CM | POA: Diagnosis not present

## 2024-04-03 DIAGNOSIS — R110 Nausea: Secondary | ICD-10-CM | POA: Diagnosis not present

## 2024-04-03 DIAGNOSIS — F1129 Opioid dependence with unspecified opioid-induced disorder: Secondary | ICD-10-CM

## 2024-04-03 DIAGNOSIS — F11288 Opioid dependence with other opioid-induced disorder: Secondary | ICD-10-CM

## 2024-04-03 HISTORY — DX: Opioid use, unspecified with withdrawal: F11.93

## 2024-04-03 MED ORDER — ONDANSETRON 4 MG PO TBDP: 4.0000 mg | ORAL_TABLET | Freq: Three times a day (TID) | ORAL | 2 refills | Status: AC | PRN

## 2024-04-03 MED ORDER — BUPRENORPHINE HCL-NALOXONE HCL 8-2 MG SL FILM: 1.0000 | ORAL_FILM | Freq: Every day | SUBLINGUAL | 0 refills | Status: DC

## 2024-04-03 NOTE — Progress Notes (Signed)
 ====================================  [facility]: [phone]   --  Virtual Video Medical Office Visit --  Patient: Denise Rubio      Age: 38 y.o.       Sex:  female  Date:   04/03/2024 Today's Healthcare Provider: Anthon Kins, MD  ====================================    Chief Complaint/Reason For Visit: Medication Refill Wishes to resume suboxone due to stopped it and now in withdrawal.   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 Discussed the use of AI scribe software for clinical note transcription with the patient, who gave verbal consent to proceed.  History of Present Illness HISTORY OF PRESENT ILLNESS       38 year old female with opioid use disorder presenting with withdrawal symptoms after discontinuing Suboxone.  Patient reports experiencing withdrawal symptoms after discontinuing Suboxone approximately 3-4 days ago. Her symptoms include: Physical symptoms: Nausea, vomiting, hot and cold sensations, leg pain Psychological symptoms: Anxiety, emotional lability She attempted to extend her last doses of Suboxone due to external pressures and has now been completely off the medication for several  days. To manage her symptoms, she has been: Taking gabapentin, which she picked up today Using nausea medication (ran out after three doses yesterday due to persistent vomiting) Taking clonidine for withdrawal symptoms Treatment Request: Patient wishes to continue her previous regimen of one 8 mg Suboxone film daily, which has been effective without causing intoxication. She reports it allows her to function normally, including driving and caring for her children.      Psychosocial Context: Reports significant anxiety and emotional distress related to the withdrawal process Felt external pressure to discontinue Suboxone Recently engaged with therapy services at [facility] Awaiting assessment appointment with mental health services  Medication Review Note: Current medications include bupropion, chlorthalidone, lisinopril, gabapentin, omeprazole, and several PRN medications. Patient previously on Suboxone 8-2 mg film (discontinued). Last UDS (11/18/2023): Positive for buprenorphine, cannabinoids, and ethanol biomarkers. Negative for other substances of concern.   Medications reviewed Current Outpatient Medications on File Prior to Visit  Medication Sig   buPROPion ER (WELLBUTRIN SR) 100 MG 12 hr tablet Take 1 tablet (100 mg total) by mouth in the morning.   chlorthalidone (HYGROTON) 25 MG tablet Take 1 tablet (25 mg total) by mouth daily. Take in am, replaces hydrochloroTHIAZIDE.   famotidine-calcium carbonate-magnesium hydroxide (PEPCID COMPLETE) 10-800-165 MG chewable tablet Chew 1 tablet by mouth 2 (two) times daily as needed.   gabapentin (NEURONTIN) 300 MG capsule Take 1 capsule (300 mg total) by mouth 3 (three) times daily.   hydrochlorothiazide (HYDRODIURIL) 25 MG tablet Take 1 tablet (25 mg total) by mouth daily. To start, take half tablet daily, only if home blood pressures are over 140/90   levonorgestrel (MIRENA) 20 MCG/DAY IUD by Intrauterine route.   lisinopril (ZESTRIL) 40 MG  tablet Take 1 tablet (40 mg total) by mouth daily. History of tubal but can't take if pregnant.  Olopatadine HCl 0.2 % SOLN Apply 2 drops to eye daily at 6 (six) AM.   omeprazole (PRILOSEC) 20 MG capsule Take 1 capsule (20 mg total) by mouth daily.   ondansetron (ZOFRAN-ODT) 4 MG disintegrating tablet Take 4 mg by mouth 3 (three) times daily as needed.   VENTOLIN HFA 108 (90 Base) MCG/ACT inhaler Inhale 2 puffs into the lungs every 4 (four) hours as needed for shortness of breath.   benzonatate (TESSALON) 200 MG capsule Take 200 mg by mouth 3 (three) times daily as needed. (Patient not taking: Reported on 04/03/2024)   chlorhexidine (PERIDEX) 0.12 % solution SWISH 15 ML (1 CAPFUL) IN THE MOUTH FOR 30 SECONDS, THEN EXPECTORATE TWICE DAILY. START THE DAY BEFORE SURGERY AND CONTINUE THE DAY AFTER SURGERY (Patient not taking: Reported on 04/03/2024)   methocarbamol (ROBAXIN) 500 MG tablet Take 500 mg by mouth 4 (four) times daily as needed.   oseltamivir (TAMIFLU) 75 MG capsule Take 75 mg by mouth 2 (two) times daily. (Patient not taking: Reported on 04/03/2024)   promethazine-dextromethorphan (PROMETHAZINE-DM) 6.25-15 MG/5ML syrup Take 5 mLs by mouth every 4 (four) hours as needed. (Patient not taking: Reported on 04/03/2024)   No current facility-administered medications on file prior to visit.   Medications Discontinued During This Encounter  Medication Reason   Buprenorphine HCl-Naloxone HCl (SUBOXONE) 8-2 MG FILM Reorder        Virtual Physical Exam Physical Exam Exam Context: Evaluation limited by virtual format; however, patient is clearly visualized, cooperative, and engaged throughout. General Appearance: Well-developed, well-nourished; no acute distress by limited video assessment. Pulmonary: No respiratory distress apparent; normal work of breathing observed. Neurological: Patient is awake, alert, and demonstrates no obvious focal neurological deficits or cognitive impairments; sensorium  appears unclouded. Psychiatric/Mental Status: Mood is appropriate; demeanor is pleasant, calm, and articulate. Speech is coherent and goal-directed with no evidence of slurred or pressured speech. No abnormal psychomotor activity noted. Substance Misuse Indicators: Pupils appear symmetric and reactive as far as can be assessed via video. No track marks, skin lesions, or other stigmata of substance misuse visible.  Patient does have signs of withdrawal such as restlessness, mildly distressed speech patterns, tearfulness, facial expressions of distress and discomfort.            No results found for any visits on 04/03/24. Office Visit on 11/18/2023  Component Date Value   Buprenorphine 11/18/2023 Comment    Ethyl Glucuronide 11/18/2023 Comment    Amphetamines 11/18/2023 Negative    Barbiturates 11/18/2023 Negative    Benzodiazepines 11/18/2023 Negative    Cocaine Metabolite 11/18/2023 Negative    Phencyclidine (PCP) 11/18/2023 Negative    Marijuana MTB (THC) 11/18/2023 Comment    6-Acetylmorphine 11/18/2023 Negative    Opiates 11/18/2023 Negative    Oxycodone 11/18/2023 Negative    Tapentadol 11/18/2023 Negative    Fentanyl 11/18/2023 Negative    Methadone 11/18/2023 Negative    Propoxyphene 11/18/2023 Negative    Tramadol 11/18/2023 Negative    Carisoprodol 11/18/2023 Negative    Gabapentin 11/18/2023 Negative    Creatinine 11/18/2023 100    Urine pH 11/18/2023 7.5    Nitrites 11/18/2023 Negative    BUPRENORPHINE 11/18/2023 ++POSITIVE++ (A)    Buprenorphine/CR 11/18/2023 35    Norbuprenorphine/CR 11/18/2023 200    Norbup/Bup Ratio 11/18/2023 5.71    OPIATE ANTAGONIST 11/18/2023 ++POSITIVE++ (A)    Naloxone 11/18/2023 33    CANNABINOIDS 11/18/2023 ++POSITIVE++ (A)    THC/CR Ratio 11/18/2023 92    ETHANOL BIOMARKERS 11/18/2023 ++POSITIVE++ (A)  Ethyl Glucuronide 11/18/2023 >50000    Ethyl Sulfate 11/18/2023 7,878   No image results found. No results found.US BREAST  LTD UNI LEFT INC AXILLA Result Date: 02/19/2023 CLINICAL DATA:  Patient presents to the emergency room for possible left breast infection/abscess. EXAM: ULTRASOUND OF THE LEFT BREAST COMPARISON:  Previous exam(s). FINDINGS: Note that this exam was performed through the emergency department as there was no breast imaging radiologist present for the exam. Targeted ultrasound is performed, showing a superficial complex fluid collection within the thickened skin of the area of concern over the upper central periareolar region. This collection as a small component extending deeper into the breast tissue and may be an evolving abscess. IMPRESSION: Superficial complex fluid collection within the skin of the upper central periareolar region of the left breast. In the proper clinical setting, this is compatible with superficial infection/mastitis with small deeper component possibly evolving abscess. RECOMMENDATION: Recommend initiating 7-10 day course of antibiotics as well as further complete diagnostic imaging workup with mammogram/ultrasound at the [facility]. I have discussed the findings and recommendations with the patient. If applicable, a reminder letter will be sent to the patient regarding the next appointment. BI-RADS CATEGORY  0: Incomplete. Need additional imaging evaluation and/or prior mammograms for comparison. Electronically Signed   By: Elberta Fortis M.D.   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 Symptoms include nausea, vomiting, anxiety, emotional lability, hot/cold sensations, and leg pain.  Treatment History Previously stable on Suboxone 8mg  film daily with good functional status. Reports no intoxication  effects, maintaining normal activities including childcare and driving.  Self-Management Attempting to manage symptoms with gabapentin, clonidine, and anti-nausea medication with limited success.  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 History of underlying anxiety disorder with PTSD features.  Treatment Currently on bupropion. Previous trials with gabapentin showed positive response, while duloxetine had side effects without benefit.  PLAN: Immediate Management: Continue clonidine for withdrawal-related anxiety Long-term Management: Continue current psychiatric medication regimen Referrals: Encourage continuation with [facility] therapy services Monitoring: Reassess anxiety symptoms at follow-up visit once withdrawal symptoms have resolved  ADDITIONAL HEALTH CONSIDERATIONS   Condition Assessment & Plan  Constipation Common side effect of long-term Suboxone use. Patient aware and managing with OTC medications like Dulcolax or Senna. Continue current management approach with OTC laxatives as needed.  Hypertension Currently on lisinopril 40mg  daily and chlorthalidone 25mg  daily. Continue  current medication regimen. Home BP monitoring encouraged.  Tobacco Use Ongoing tobacco use with some reduction attributed to Suboxone stabilization. Continue to offer cessation resources at future visits.   SEEK IMMEDIATE MEDICAL ATTENTION if experiencing: Severe vomiting with inability to keep fluids  down for >24 hours Signs of dehydration (excessive thirst, dark urine, dizziness) Thoughts of self-harm or severe psychological distress    FOLLOW-UP PLAN: Schedule appointment prior to medication depletion (within 30 days). Continue engagement with [facility] therapy. Total encounter time: 13 minutes.         Orders Placed During this Encounter:  No orders of the defined types were placed in this encounter.  Meds ordered this encounter  Medications   Buprenorphine HCl-Naloxone HCl (SUBOXONE) 8-2 MG FILM    Sig: Place 1 Film under the tongue daily at 6 (six) AM. At first, take only 0.25  films (2 mg opioid) each hour until minimum effective dose reached to control cravings, up to 2 films total in day max.    Dispense:  30 each    Refill:  0   ondansetron (ZOFRAN-ODT) 4 MG disintegrating tablet    Sig: Take 1 tablet (4 mg total) by mouth every 8 (eight) hours as needed for nausea or vomiting.    Dispense:  40 tablet    Refill:  2     MEDICAL DECISION MAKING ATTESTATION   MODERATE-HIGH COMPLEXITY MDM (91478)       MDM Complexity Documentation: This encounter involved moderate to high complexity medical decision making to address the patient's acute withdrawal symptoms and ongoing management of opioid use disorder, with consideration of multiple comorbidities.   Element 1: Number and Complexity of Problems Addressed  Complexity Level Moderate to High  Primary Problem Opioid Use Disorder with Acute Withdrawal (F11.23) High-risk condition with potential for significant morbidity Acute presentation requiring immediate intervention Condition presents risk without appropriate treatment   Secondary Problems Anxiety Disorder with PTSD Features (F41.1) - Chronic, exacerbated by current withdrawal state Hypertension (I10) - Stable but requiring management alongside OUD treatment  Problem Interactions Withdrawal symptoms exacerbating underlying anxiety Potential impact of medication decisions on multiple conditions Need to balance treatment approaches across comorbidities   Element 2: Amount and/or Complexity of Data Reviewed  Complexity Level Moderate  Data Reviewed Review of prior urine drug screen results from 11/18/2023 Assessment of recent medication history and effectiveness Review of prior treatment response to Suboxone Evaluation of previous psychiatric medication trials Integration of information from problem list and prior visit notes  Independent Assessment Independent evaluation of withdrawal severity Assessment of treatment adherence patterns Interpretation of current symptomatic presentation   Element 3: Risk of Complications and/or Morbidity/Mortality  Risk Level Moderate to High  Risk of No Treatment Continued withdrawal symptoms with potential for dehydration Risk of return to illicit opioid use if withdrawal untreated Exacerbation of anxiety and PTSD symptoms Potential destabilization of recovery progress  Treatment Risk Management Prescription of controlled substance (Suboxone) requiring careful monitoring Management of multiple psychoactive medications (bupropion, gabapentin, clonidine) Coordination with mental health treatment Close follow-up required within 30 days to ensure stabilization  Social Determinants Impact External pressures affecting medication adherence Childcare responsibilities requiring functional status Need for integrated behavioral health approach  MDM Summary: The medical decision making for this encounter meets criteria for moderate to high complexity (29562) based on:  Multiple problems addressed with one high-risk acute  condition (opioid withdrawal) Moderate amount of data reviewed from multiple sources Moderate to high risk of morbidity without intervention and management of controlled substance therapy This patient presentation required comprehensive assessment, prescription drug management of high-risk medications, and coordination between multiple treatment domains.   Treatment plan discussed and reviewed in detail. Explained medication safety and potential side effects.  Answered all patient questions and  confirmed understanding and comfort with the plan. Encouraged patient to contact our office if they have any questions or concerns.  Agreed on patient coming for a sooner office visit if symptoms worsen, persist, or new symptoms develop. Discussed precautions in case of needing to visit the Emergency Department.    ----------------------------------------------------- Attestation:  Today's Healthcare Provider Anthon Kins, MD was located at office at [facility] [address] [place] [address].  The patient was located in car outside pharmacy. All video encounter participant identities and locations confirmed visually and verbally.Today's Telemedicine visit was conducted via synchronous Video after consent for telemedicine was obtained:  Video connection was never lost    This document was transcribed and resynthesized, in part, by artificial intelligence ([employer]) using HIPAA-compliant recording of the clinical interaction;   We have discussed the our use of AI scribe software for clinical note transcription with the patient, who has given verbal consent to proceed.

## 2024-04-05 ENCOUNTER — Encounter: Payer: Self-pay | Admitting: Internal Medicine

## 2024-04-05 NOTE — Assessment & Plan Note (Signed)
 ASSESSMENT & PLAN   OPIOID DEPENDENCE WITH WITHDRAWAL SYMPTOMS (F11.23)   Assessment Clinical Findings  Current Status Patient experiencing moderate to severe withdrawal symptoms after 3-4 days without Suboxone. Symptoms include nausea, vomiting, anxiety, emotional lability, hot/cold sensations, and leg pain.  Treatment History Previously stable on Suboxone 8mg  film daily with good functional status. Reports no intoxication effects, maintaining normal activities including childcare and driving.  Self-Management Attempting to manage symptoms with gabapentin, clonidine, and anti-nausea medication with limited success.  PLAN: Medication: Prescribe Suboxone 8-2mg  film daily for 30 days Symptom Management: Prescribe ondansetron (Zofran-ODT) 4mg  tablet every 8 hours PRN for nausea/vomiting Monitoring: Scheduled follow-up prior to medication depletion Education: Discussed safety profile of long-term Suboxone therapy compared to other opioids Contingency Planning: Reviewed importance of maintaining consistent therapy and avoiding abrupt discontinuation

## 2024-04-05 NOTE — Patient Instructions (Signed)
 YOUR AFTER VISIT SUMMARY  Today's Visit: Thank you for your telehealth visit today. This summary outlines your care plan and important follow-up information.  YOUR MEDICATION PLAN   Medication Instructions  Suboxone 8-2 mg film Place 1 film under your tongue daily in the morning. At first, take only  film (2 mg) each hour until minimum dose needed to control cravings, up to 2 films total in one day.  Ondansetron (Zofran-ODT) 4 mg Take 1 tablet by mouth every 8 hours as needed for nausea or vomiting. This dissolves on your tongue - no water needed.  Your other medications Continue all other medications as previously prescribed unless specifically discussed today.   MANAGING YOUR SYMPTOMS  For Withdrawal Symptoms: Take your Suboxone exactly as prescribed to help control withdrawal symptoms Use ondansetron (Zofran) as needed for nausea and vomiting Continue gabapentin as prescribed to help with discomfort Stay hydrated by sipping clear fluids regularly, especially if experiencing vomiting Use comfort measures such as warm showers or baths for physical discomfort For Anxiety Symptoms: Continue your prescribed medications Practice deep breathing exercises when feeling anxious Maintain your therapy appointments Use relaxation techniques that have helped you in the past For Constipation: Continue using over-the-counter laxatives like Dulcolax or Senna as needed Increase fiber in your diet and drink plenty of water Regular physical activity can help with bowel regularity SEEK IMMEDIATE MEDICAL ATTENTION IF YOU EXPERIENCE: Severe vomiting where you cannot keep any fluids down for 24 hours Signs of dehydration (extreme thirst, dark urine, dizziness, confusion) Thoughts of harming yourself or severe emotional distress Difficulty breathing or chest pain Severe abdominal pain     YOUR FOLLOW-UP PLAN   Appointment Type When  Medication Follow-up Schedule your next appointment within 30 days  (before your medication runs out)  Therapy Continue with your scheduled appointments at [facility]  IMPORTANT REMINDERS:  Call our office if your symptoms worsen or you have questions about your medications Always store Suboxone in a safe place away from children Avoid alcohol while taking your medications Bring all your medications to your next appointment Continue monitoring your blood pressure at home if possible HELPFUL RESOURCES: ?? Substance Use Support Hotline: 1-800-662-HELP (4357) - Available 24/7 ?? Suicide & Crisis Lifeline: Dial 988 - Available 24/7 ?? Our Office: [employer] Number] - Call during business hours for prescription issues or questions

## 2024-04-05 NOTE — Assessment & Plan Note (Signed)
 ANXIETY DISORDER (F41.9)   Assessment Clinical Findings  Current Status Heightened anxiety, likely exacerbated by current withdrawal state. History of underlying anxiety disorder with PTSD features.  Treatment Currently on bupropion. Previous trials with gabapentin showed positive response, while duloxetine had side effects without benefit.  PLAN: Immediate Management: Continue clonidine for withdrawal-related anxiety Long-term Management: Continue current psychiatric medication regimen Referrals: Encourage continuation with [facility] therapy services Monitoring: Reassess anxiety symptoms at follow-up visit once withdrawal symptoms have resolved

## 2024-04-05 NOTE — Assessment & Plan Note (Signed)
 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

## 2024-04-06 ENCOUNTER — Other Ambulatory Visit (HOSPITAL_COMMUNITY): Payer: Self-pay

## 2024-04-17 ENCOUNTER — Other Ambulatory Visit: Payer: Self-pay | Admitting: Internal Medicine

## 2024-04-28 ENCOUNTER — Other Ambulatory Visit: Payer: Self-pay | Admitting: Internal Medicine

## 2024-04-28 DIAGNOSIS — K2100 Gastro-esophageal reflux disease with esophagitis, without bleeding: Secondary | ICD-10-CM

## 2024-05-04 ENCOUNTER — Encounter: Payer: Self-pay | Admitting: Internal Medicine

## 2024-05-04 ENCOUNTER — Telehealth (INDEPENDENT_AMBULATORY_CARE_PROVIDER_SITE_OTHER): Admitting: Internal Medicine

## 2024-05-04 DIAGNOSIS — R112 Nausea with vomiting, unspecified: Secondary | ICD-10-CM

## 2024-05-04 DIAGNOSIS — G629 Polyneuropathy, unspecified: Secondary | ICD-10-CM | POA: Insufficient documentation

## 2024-05-04 DIAGNOSIS — Z79899 Other long term (current) drug therapy: Secondary | ICD-10-CM

## 2024-05-04 DIAGNOSIS — F1129 Opioid dependence with unspecified opioid-induced disorder: Secondary | ICD-10-CM

## 2024-05-04 DIAGNOSIS — K5903 Drug induced constipation: Secondary | ICD-10-CM | POA: Insufficient documentation

## 2024-05-04 DIAGNOSIS — M5442 Lumbago with sciatica, left side: Secondary | ICD-10-CM

## 2024-05-04 DIAGNOSIS — M5441 Lumbago with sciatica, right side: Secondary | ICD-10-CM

## 2024-05-04 DIAGNOSIS — G8929 Other chronic pain: Secondary | ICD-10-CM | POA: Insufficient documentation

## 2024-05-04 MED ORDER — SENNOSIDES 8.6 MG PO TABS: 1.0000 | ORAL_TABLET | Freq: Every day | ORAL | 3 refills | Status: AC

## 2024-05-04 MED ORDER — BUPRENORPHINE HCL-NALOXONE HCL 8-2 MG SL FILM: 1.0000 | ORAL_FILM | Freq: Every day | SUBLINGUAL | 0 refills | Status: DC

## 2024-05-04 MED ORDER — GABAPENTIN 300 MG PO CAPS: 300.0000 mg | ORAL_CAPSULE | Freq: Three times a day (TID) | ORAL | 2 refills | Status: DC

## 2024-05-04 NOTE — Progress Notes (Signed)
 ====================================  [facility]: [phone]   --  Virtual Video Medical Office Visit --  Patient: Denise Rubio      Age: 38 y.o.       Sex:  female  Date:   05/04/2024 Today's Healthcare Provider: Anthon Kins, MD  ====================================    Chief Complaint/Reason For Visit: Medication Refill Suboxone  management Nausea vomiting  Chart reviewed: has HTN (hypertension); Anxiety; Chronic hepatitis C without hepatic coma (HCC); Insomnia; Tobacco user; History of drug use disorder; PTSD (post-traumatic stress disorder); Weight disorder; Idiopathic peripheral neuropathy; Chronic bronchitis (HCC); Elevated blood pressure reading; High risk medication use; Opioid dependence with opioid-induced disorder (HCC); FH: breast cancer; Encounter for monitoring Suboxone  maintenance therapy; Opioid withdrawal (HCC); Chronic bilateral low back pain with bilateral sciatica; Neuropathy; and Drug-induced constipation on their problem list..  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   History of Present Illness 38 year old female who presents with nausea and vomiting.  She has been experiencing nausea and vomiting for the past three weeks, with episodes occurring two to three times a week, typically 20 to 30 minutes after eating. The vomitus is sometimes yellow, indicating the presence of stomach acid. Despite taking Prilosec 20 mg and Zofran , these symptoms persist. She feels better after vomiting but has noticed recent weight loss, which she attributes to these episodes.  She has a history of frequent BC powder use, which her mother suggested might have caused stomach issues. Due to concerns about  potential stomach damage, she has reduced her intake of BC powders. Her eating pattern is poor, often eating only once a day and not consuming full meals. She experiences stomach discomfort and pain at the epigastric region, and suspects it might be related to her gallbladder, as her brother had similar symptoms. She also reports frequent burping and a reduced appetite, often feeling full after only a few bites of food.  She is currently taking Suboxone  8 mg daily, split into two doses, and Colace for constipation, which occurs every other day or every two days.  She has a history of back pain related to sciatica, which is exacerbated by prolonged sitting. The pain radiates from the top of her buttock to the back of her kneecap. She takes gabapentin  300 mg three times a day for this condition, but it is not sufficiently managing her pain. She avoids taking Tylenol  or ibuprofen  due to concerns about liver health.   Medications reviewed Current Outpatient Medications on File Prior to Visit  Medication Sig   buPROPion  ER (WELLBUTRIN  SR) 100 MG 12 hr tablet Take 1 tablet (100 mg total) by mouth in the morning.   chlorthalidone  (HYGROTON ) 25 MG tablet Take 1 tablet (25 mg total) by mouth daily. Take in am, replaces hydrochloroTHIAZIDE .   famotidine -calcium carbonate-magnesium hydroxide (PEPCID  COMPLETE) 10-800-165 MG chewable tablet Chew 1 tablet by mouth 2 (two) times daily as needed.   hydrochlorothiazide  (HYDRODIURIL ) 25 MG tablet Take 1 tablet (25 mg total) by mouth daily. To start, take half tablet daily, only if home blood pressures are over 140/90   levonorgestrel (MIRENA) 20 MCG/DAY IUD by Intrauterine route.   lisinopril  (ZESTRIL ) 40 MG tablet Take 1 tablet (40 mg total) by mouth daily. History of tubal but can't take if pregnant.   methocarbamol (ROBAXIN) 500  MG tablet Take 500 mg by mouth 4 (four) times daily as needed.   Olopatadine  HCl 0.2 % SOLN Apply 2 drops to eye daily at 6 (six) AM.    omeprazole  (PRILOSEC) 20 MG capsule Take 1 capsule by mouth once daily   ondansetron  (ZOFRAN -ODT) 4 MG disintegrating tablet Take 4 mg by mouth 3 (three) times daily as needed.   ondansetron  (ZOFRAN -ODT) 4 MG disintegrating tablet Take 1 tablet (4 mg total) by mouth every 8 (eight) hours as needed for nausea or vomiting.   VENTOLIN HFA 108 (90 Base) MCG/ACT inhaler Inhale 2 puffs into the lungs every 4 (four) hours as needed for shortness of breath.   No current facility-administered medications on file prior to visit.   Medications Discontinued During This Encounter  Medication Reason   benzonatate (TESSALON) 200 MG capsule    chlorhexidine (PERIDEX) 0.12 % solution    oseltamivir (TAMIFLU) 75 MG capsule    promethazine-dextromethorphan (PROMETHAZINE-DM) 6.25-15 MG/5ML syrup    gabapentin  (NEURONTIN ) 300 MG capsule Reorder   Buprenorphine  HCl-Naloxone  HCl (SUBOXONE ) 8-2 MG FILM Reorder        Virtual Physical Exam Physical Exam Exam Context: Evaluation limited by virtual format; however, patient is clearly visualized, cooperative, and engaged throughout. General Appearance: Well-developed, well-nourished; no acute distress by limited video assessment. Pulmonary: No respiratory distress apparent; normal work of breathing observed. Neurological: Patient is awake, alert, and demonstrates no obvious focal neurological deficits or cognitive impairments; sensorium appears unclouded. Psychiatric/Mental Status: Mood is appropriate; demeanor is pleasant, calm, and articulate. Speech is coherent and goal-directed with no evidence of slurred or pressured speech. No abnormal psychomotor activity noted. Substance Misuse Indicators: Pupils appear symmetric and reactive as far as can be assessed via video. No track marks, skin lesions, or other stigmata of substance misuse visible. No signs of intoxication or withdrawal are evident.        No results found for any visits on 05/04/24. Office Visit  on 11/18/2023  Component Date Value   Buprenorphine  11/18/2023 Comment    Ethyl Glucuronide 11/18/2023 Comment    Amphetamines 11/18/2023 Negative    Barbiturates 11/18/2023 Negative    Benzodiazepines 11/18/2023 Negative    Cocaine Metabolite 11/18/2023 Negative    Phencyclidine (PCP) 11/18/2023 Negative    Marijuana MTB (THC) 11/18/2023 Comment    6-Acetylmorphine 11/18/2023 Negative    Opiates 11/18/2023 Negative    Oxycodone 11/18/2023 Negative    Tapentadol 11/18/2023 Negative    Fentanyl 11/18/2023 Negative    Methadone  11/18/2023 Negative    Propoxyphene 11/18/2023 Negative    Tramadol  11/18/2023 Negative    Carisoprodol 11/18/2023 Negative    Gabapentin  11/18/2023 Negative    Creatinine 11/18/2023 100    Urine pH 11/18/2023 7.5    Nitrites 11/18/2023 Negative    BUPRENORPHINE  11/18/2023 ++POSITIVE++ (A)    Buprenorphine /CR 11/18/2023 35    Norbuprenorphine/CR 11/18/2023 200    Norbup/Bup Ratio 11/18/2023 5.71    OPIATE ANTAGONIST 11/18/2023 ++POSITIVE++ (A)    Naloxone  11/18/2023 33    CANNABINOIDS 11/18/2023 ++POSITIVE++ (A)    THC/CR Ratio 11/18/2023 92    ETHANOL BIOMARKERS 11/18/2023 ++POSITIVE++ (A)    Ethyl Glucuronide 11/18/2023 >50000    Ethyl Sulfate 11/18/2023 7,878   No image results found. No results found.US  BREAST LTD UNI LEFT INC AXILLA Result Date: 02/19/2023 CLINICAL DATA:  Patient presents to the emergency room for possible left breast infection/abscess. EXAM: ULTRASOUND OF THE LEFT BREAST COMPARISON:  Previous exam(s). FINDINGS: Note that this exam was performed through the  emergency department as there was no breast imaging radiologist present for the exam. Targeted ultrasound is performed, showing a superficial complex fluid collection within the thickened skin of the area of concern over the upper central periareolar region. This collection as a small component extending deeper into the breast tissue and may be an evolving abscess. IMPRESSION:  Superficial complex fluid collection within the skin of the upper central periareolar region of the left breast. In the proper clinical setting, this is compatible with superficial infection/mastitis with small deeper component possibly evolving abscess. RECOMMENDATION: Recommend initiating 7-10 day course of antibiotics as well as further complete diagnostic imaging workup with mammogram/ultrasound at the [facility]. I have discussed the findings and recommendations with the patient. If applicable, a reminder letter will be sent to the patient regarding the next appointment. BI-RADS CATEGORY  0: Incomplete. Need additional imaging evaluation and/or prior mammograms for comparison. Electronically Signed   By: Roda Cirri M.D.   On: 02/19/2023 19:34      Assessment & Plan Opioid dependence with opioid-induced disorder (HCC) Opioid dependence is effectively managed with Suboxone . She is reminded to upload pictures of unopened films halfway through the month via MyChart to ensure compliance. Send Suboxone  prescription to [facility], 30 of the 8 mg films. Instruct to upload pictures of unopened films halfway through the month via MyChart. Nausea and vomiting, unspecified vomiting type Intermittent nausea and vomiting occur 2-3 times a week, possibly due to dietary habits or gallbladder issues. Symptoms include pain under the rib cage and vomiting after meals. Differential includes gallbladder issues or severe constipation. An abdominal ultrasound is planned to evaluate gallbladder function. Order abdominal ultrasound. Continue taking Zofran  as needed for nausea. Drug-induced constipation Constipation may be related to Suboxone  use, with bowel movements every other day or every two days. She is advised to take Senna with Colace. Prescribe Senna to be taken with Colace. Chronic bilateral low back pain with bilateral sciatica Chronic sciatic pain worsens with prolonged sitting. Current gabapentin   dosage is inadequate. Her last back evaluation was two years ago, and she is open to consulting a sports medicine specialist. Increase gabapentin  to 120 tablets to allow taking two at once. Refer to a sports medicine specialist for further evaluation. High risk medication use PDMP reviewed during this encounter. Advised patient to upload film counts      Orders Placed During this Encounter:   Orders Placed This Encounter  Procedures   US  Abdomen Complete    Standing Status:   Future    Expiration Date:   05/04/2025    Reason for Exam (SYMPTOM  OR DIAGNOSIS REQUIRED):   nausea, vomiting, burping    Preferred imaging location?:   [facility]   Ambulatory referral to Sports Medicine    Referral Priority:   Routine    Referral Type:   Consultation    Number of Visits Requested:   1   Meds ordered this encounter  Medications   Buprenorphine  HCl-Naloxone  HCl (SUBOXONE ) 8-2 MG FILM    Sig: Place 1 Film under the tongue daily at 6 (six) AM.    Dispense:  30 each    Refill:  0   senna (SENOKOT) 8.6 MG tablet    Sig: Take 1 tablet (8.6 mg total) by mouth daily.    Dispense:  90 tablet    Refill:  3   gabapentin  (NEURONTIN ) 300 MG capsule    Sig: Take 1 capsule (300 mg total) by mouth 3 (three) times daily.  Dispense:  120 capsule    Refill:  2   Medical Decision Making: 2 or more stable chronic illnesses Prescription drug management     Treatment plan discussed and reviewed in detail. Explained medication safety and potential side effects.  Answered all patient questions and confirmed understanding and comfort with the plan. Encouraged patient to contact our office if they have any questions or concerns.  Agreed on patient coming for a sooner office visit if symptoms worsen, persist, or new symptoms develop. Discussed precautions in case of needing to visit the Emergency Department.    ----------------------------------------------------- Attestation:  Today's Healthcare  Provider Anthon Kins, MD was located at office at [facility] [address], [place] [address].  The patient was located at store, in car. parked. All video encounter participant identities and locations confirmed visually and verbally.Today's Telemedicine visit was conducted via synchronous Video after consent for telemedicine was obtained:  Video connection was never lost    This document was transcribed and resynthesized, in part, by artificial intelligence (Abridge) using HIPAA-compliant recording of the clinical interaction;   We have discussed the our use of AI scribe software for clinical note transcription with the patient, who has given verbal consent to proceed.

## 2024-05-04 NOTE — Assessment & Plan Note (Signed)
 Opioid dependence is effectively managed with Suboxone . She is reminded to upload pictures of unopened films halfway through the month via MyChart to ensure compliance. Send Suboxone  prescription to [facility], 30 of the 8 mg films. Instruct to upload pictures of unopened films halfway through the month via MyChart.

## 2024-05-04 NOTE — Assessment & Plan Note (Signed)
 Constipation may be related to Suboxone  use, with bowel movements every other day or every two days. She is advised to take Senna with Colace. Prescribe Senna to be taken with Colace.

## 2024-05-04 NOTE — Assessment & Plan Note (Signed)
 PDMP reviewed during this encounter. Advised patient to upload film counts

## 2024-05-04 NOTE — Assessment & Plan Note (Signed)
 Chronic sciatic pain worsens with prolonged sitting. Current gabapentin  dosage is inadequate. Her last back evaluation was two years ago, and she is open to consulting a sports medicine specialist. Increase gabapentin  to 120 tablets to allow taking two at once. Refer to a sports medicine specialist for further evaluation.

## 2024-05-04 NOTE — Patient Instructions (Signed)
 VISIT SUMMARY:  During your visit, we discussed your ongoing issues with nausea and vomiting, chronic pain due to sciatica, opioid dependence, constipation, and hypertension. We reviewed your symptoms, current medications, and made adjustments to your treatment plan to better manage your conditions.  YOUR PLAN:  -CHRONIC PAIN DUE TO SCIATICA: Sciatica is pain that radiates along the path of the sciatic nerve, which extends from your lower back through your hips and buttocks and down each leg. Your current medication, gabapentin , is not adequately managing your pain, so we are increasing your dosage to allow taking two tablets at once. We will also refer you to a sports medicine specialist for further evaluation.  -OPIOID DEPENDENCE WITH WITHDRAWAL: Opioid dependence is a condition where the body relies on opioids to function normally. Your dependence is being effectively managed with Suboxone . Please remember to upload pictures of your unopened films halfway through the month via MyChart to ensure compliance. We have sent a prescription for 30 of the 8 mg films to [facility].  -NAUSEA AND VOMITING: Nausea and vomiting can be caused by various factors, including dietary habits or gallbladder issues. You experience these symptoms 2-3 times a week, often after meals. We have ordered an abdominal ultrasound to evaluate your gallbladder function. Continue taking Zofran  as needed for nausea.  -CONSTIPATION: Constipation is infrequent bowel movements or difficulty passing stools. This may be related to your use of Suboxone . We recommend taking Senna along with Colace to help manage this condition.  -HYPERTENSION: Hypertension is high blood pressure. Your blood pressure is currently well-controlled with a recent reading of 135/78 mmHg.  INSTRUCTIONS:  Please follow up with the sports medicine specialist for your sciatica pain evaluation. Additionally, ensure you complete the abdominal ultrasound as planned.  Continue taking your medications as prescribed and upload pictures of your unopened Suboxone  films halfway through the month via MyChart.

## 2024-05-13 ENCOUNTER — Inpatient Hospital Stay: Admission: RE | Admit: 2024-05-13 | Source: Ambulatory Visit

## 2024-05-14 ENCOUNTER — Other Ambulatory Visit: Payer: Self-pay | Admitting: Internal Medicine

## 2024-05-14 DIAGNOSIS — K2100 Gastro-esophageal reflux disease with esophagitis, without bleeding: Secondary | ICD-10-CM

## 2024-05-14 DIAGNOSIS — G629 Polyneuropathy, unspecified: Secondary | ICD-10-CM

## 2024-05-15 ENCOUNTER — Telehealth: Payer: Self-pay

## 2024-05-15 DIAGNOSIS — G629 Polyneuropathy, unspecified: Secondary | ICD-10-CM

## 2024-05-15 MED ORDER — GABAPENTIN 300 MG PO CAPS: 300.0000 mg | ORAL_CAPSULE | Freq: Four times a day (QID) | ORAL | 2 refills | Status: DC

## 2024-05-15 NOTE — Telephone Encounter (Signed)
 Sent to provider to see the corrected dosage  Copied from CRM [id number]. Topic: Clinical - Prescription Issue >> May 15, 2024  3:03 PM Denise Rubio wrote: Reason for CRM:  PT states that the pharmacy sent over some requests to adjust the directions for this prescription. Pt did not know what changes were needed but they will not fill it until this issue is corrected.   gabapentin  (NEURONTIN ) 300 MG capsule    [facility] [address]), [place] - [address]) [place] [address] Phone: [phone] Fax: [phone] Hours: Not open 24 hours  Patient callback number is [phone]

## 2024-05-15 NOTE — Addendum Note (Signed)
 Addended by: Giovanni Bath G on: 05/15/2024 04:45 PM   Modules accepted: Orders

## 2024-05-19 ENCOUNTER — Telehealth: Payer: Self-pay

## 2024-05-19 NOTE — Telephone Encounter (Signed)
 Tried to call pt to see if she was able to pick up medication no answer could not leave message. Also tried to call pt pharmacy on file [facility] twice today no answer.

## 2024-05-19 NOTE — Telephone Encounter (Signed)
-----   Message from Anthon Kins sent at 05/15/2024  9:31 PM EDT ----- I changed it to 4x daily. But still seems that didn't fix it.  Need specifics on how they want it changed. (Doses per day and quantity) ----- Message ----- From: Marrianne Six, CMA Sent: 05/15/2024   3:42 PM EDT To: Anthon Kins, MD  Pt called stated that her direction on the Gabapentin  needed to be fixed if you advise.

## 2024-05-23 ENCOUNTER — Other Ambulatory Visit: Payer: Self-pay | Admitting: Internal Medicine

## 2024-05-23 DIAGNOSIS — K2100 Gastro-esophageal reflux disease with esophagitis, without bleeding: Secondary | ICD-10-CM

## 2024-06-05 ENCOUNTER — Ambulatory Visit: Admitting: Internal Medicine

## 2024-06-08 ENCOUNTER — Telehealth (INDEPENDENT_AMBULATORY_CARE_PROVIDER_SITE_OTHER): Admitting: Internal Medicine

## 2024-06-08 ENCOUNTER — Encounter: Payer: Self-pay | Admitting: Internal Medicine

## 2024-06-08 DIAGNOSIS — F1991 Other psychoactive substance use, unspecified, in remission: Secondary | ICD-10-CM

## 2024-06-08 DIAGNOSIS — Z5181 Encounter for therapeutic drug level monitoring: Secondary | ICD-10-CM

## 2024-06-08 DIAGNOSIS — Z79891 Long term (current) use of opiate analgesic: Secondary | ICD-10-CM

## 2024-06-08 DIAGNOSIS — Z79899 Other long term (current) drug therapy: Secondary | ICD-10-CM | POA: Diagnosis not present

## 2024-06-08 DIAGNOSIS — F1129 Opioid dependence with unspecified opioid-induced disorder: Secondary | ICD-10-CM

## 2024-06-08 MED ORDER — BUPRENORPHINE HCL-NALOXONE HCL 8-2 MG SL FILM: 1.0000 | ORAL_FILM | Freq: Every day | SUBLINGUAL | 0 refills | Status: DC

## 2024-06-08 NOTE — Addendum Note (Signed)
 Addended by: Jesson Foskey G on: 06/08/2024 03:37 PM   Modules accepted: Level of Service

## 2024-06-08 NOTE — Assessment & Plan Note (Signed)
 Video appointment indicates she doing well visually and verbally. She would like to continue on the same dose which is prescribed to the same pharmacy as last month.

## 2024-06-08 NOTE — Assessment & Plan Note (Signed)
 I encouraged her to send in pill counts to demonstrate she is taking it as prescribed PDMP reviewed during this encounter. Reviewed urine drug screening report from November this was prior to starting the medicine if she still had alcohol  in her system when she had borrowed this buprenorphine  from the street source per report which is part of what got her to present for treatment

## 2024-06-08 NOTE — Assessment & Plan Note (Signed)
 She denies any relapses reports consistent use of Suboxone  seems the substance use disorder is in deep remission improperly being treated

## 2024-06-08 NOTE — Patient Instructions (Signed)
 It was a pleasure seeing you today! Your health and satisfaction are our top priorities.  Scherrie Curt, MD  Your Providers PCP: Anthon Kins, MD,  [phone]) Referring Provider: Anthon Kins, MD,  [phone])     NEXT STEPS: [x]  Early Intervention: Schedule sooner appointment, call our on-call services, or go to emergency room if there is any significant Increase in pain or discomfort New or worsening symptoms Sudden or severe changes in your health [x]  Flexible Follow-Up: We recommend a Return in about 1 month (around 07/08/2024) for chronic disease monitoring and management. for optimal routine care. This allows for progress monitoring and treatment adjustments. [x]  Preventive Care: Schedule your annual preventive care visit! It's typically covered by insurance and helps identify potential health issues early. [x]  Lab & X-ray Appointments: Incomplete tests scheduled today, or call to schedule. X-rays: [facility] (M-F, 8:30am-noon or 1pm-5pm). [x]  Medical Information Release: Sign a release form at front desk to obtain relevant medical information we don't have.  MAKING THE MOST OF OUR FOCUSED 20 MINUTE APPOINTMENTS: [x]   Clearly state your top concerns at the beginning of the visit to focus our discussion [x]   If you anticipate you will need more time, please inform the front desk during scheduling - we can book multiple appointments in the same week. [x]   If you have transportation problems- use our convenient video appointments or ask about transportation support. [x]   We can get down to business faster if you use MyChart to update information before the visit and submit non-urgent questions before your visit. Thank you for taking the time to provide details through MyChart.  Let our nurse know and she can import this information into your encounter documents.  Arrival and Wait Times: [x]   Arriving on time ensures that everyone receives prompt  attention. [x]   Early morning (8a) and afternoon (1p) appointments tend to have shortest wait times. [x]   Unfortunately, we cannot delay appointments for late arrivals or hold slots during phone calls.  Getting Answers and Following Up [x]   Simple Questions & Concerns: For quick questions or basic follow-up after your visit, reach us  at [phone] or MyChart messaging. [x]   Complex Concerns: If your concern is more complex, scheduling an appointment might be best. Discuss this with the staff to find the most suitable option. [x]   Lab & Imaging Results: We'll contact you directly if results are abnormal or you don't use MyChart. Most normal results will be on MyChart within 2-3 business days, with a review message from Dr. Boston Byers. Haven't heard back in 2 weeks? Need results sooner? Contact us  at [phone]. [x]   Referrals: Our referral coordinator will manage specialist referrals. The specialist's office should contact you within 2 weeks to schedule an appointment. Call us  if you haven't heard from them after 2 weeks.  Staying Connected [x]   MyChart: Activate your MyChart for the fastest way to access results and message us . See the last page of this paperwork for instructions on how to activate.  Bring to Your Next Appointment [x]   Medications: Please bring all your medication bottles to your next appointment to ensure we have an accurate record of your prescriptions. [x]   Health Diaries: If you're monitoring any health conditions at home, keeping a diary of your readings can be very helpful for discussions at your next appointment.  Billing [x]   X-ray & Lab Orders: These are billed by separate companies. Contact the invoicing company directly for questions or concerns. [x]   Visit  Charges: Discuss any billing inquiries with our administrative services team.  Your Satisfaction Matters [x]   Share Your Experience: We strive for your satisfaction! If you have any complaints, or preferably  compliments, please let Dr. Boston Byers know directly or contact our Practice Administrators, Olinda Bertrand or [employer], by asking at the front desk.   Reviewing Your Records [x]   Review this early draft of your clinical encounter notes below and the final encounter summary tomorrow on MyChart after its been completed.  All orders placed so far are visible here: Encounter for monitoring Suboxone  maintenance therapy  High risk medication use  History of drug use disorder  Opioid dependence with opioid-induced disorder (HCC) -     Buprenorphine  HCl-Naloxone  HCl; Place 1 Film under the tongue daily at 6 (six) AM.  Dispense: 30 each; Refill: 0

## 2024-06-08 NOTE — Assessment & Plan Note (Signed)
 I encouraged her to do 12-step meetings and try to find a sponsor

## 2024-06-08 NOTE — Progress Notes (Signed)
 ====================================  [facility]: [phone]   --  Virtual Video Medical Office Visit --  Patient: Denise Rubio      Age: 38 y.o.       Sex:  female  Date:   06/08/2024 Today's Healthcare Provider: Anthon Kins, MD  ====================================    Chief Complaint/Reason For Visit: Medication Refill Suboxone  management opioid dependence  Recent Visits with Me she wants to limit visit to just suboxone  management as in most prior visits. Date Visit Type Primary Dx  05/04/2024 Telemedicine Opioid dependence with opioid-induced disorder (HCC)  04/03/2024 Telemedicine Opioid dependence with other opioid-induced disorder (HCC)  02/05/2024 Telemedicine Opioid dependence with opioid-induced disorder (HCC)  01/22/2024 Telemedicine Opioid dependence with opioid-induced disorder (HCC)  01/08/2024 Video Visit Opioid dependence with other opioid-induced disorder (HCC)  12/24/2023 Telemedicine Opioid dependence with other opioid-induced disorder (HCC)  12/10/2023 Video Visit Acute bilateral thoracic back pain  11/26/2023 Video Visit Opioid dependence with other opioid-induced disorder (HCC)  11/18/2023 Office Visit Opioid dependence with other opioid-induced disorder (HCC)   Chart reviewed: has HTN (hypertension); Anxiety; Chronic hepatitis C without hepatic coma (HCC); Insomnia; Tobacco user; History of drug use disorder; PTSD (post-traumatic stress disorder); Weight disorder; Idiopathic peripheral neuropathy; Chronic bronchitis (HCC); Elevated blood pressure reading; High risk medication use; Opioid dependence with opioid-induced disorder (HCC); FH: breast cancer; Encounter for monitoring Suboxone  maintenance therapy; Chronic bilateral low back pain with bilateral sciatica; Neuropathy; and Drug-induced constipation on their problem list..  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  Discussed the use of AI scribe software for clinical note transcription with the patient, who gave verbal consent to proceed.  History of Present Illness Patient presents for monthly follow-up Suboxone  management only.  Reports she has been staying sober watching her kids stay at home not working right now father living with her.  She holds her fingers about an inch apart and says she has been having about that much cravings.  She reports no intention of trying to taper again as in previously this year.  She reports taking the medicine as prescribed but missing her appointment 3 days ago so that she ran out and she has been in withdrawal she does not have any other issues she wants addressed today medication refill although she does have other problems.  She reports she has gone to some 12-step meetings but has not found one she likes yet   Medications reviewed Current Outpatient Medications on File Prior to Visit  Medication Sig   buPROPion  ER (WELLBUTRIN  SR) 100 MG 12 hr tablet Take 1 tablet (100 mg total) by mouth in the morning.   chlorthalidone  (HYGROTON ) 25 MG tablet Take 1 tablet (25 mg total) by mouth daily. Take in am, replaces hydrochloroTHIAZIDE .   famotidine -calcium carbonate-magnesium hydroxide (PEPCID  COMPLETE) [phone] MG chewable tablet Chew 1 tablet by mouth 2 (two) times daily as needed.   gabapentin  (NEURONTIN ) 300 MG capsule Take 1 capsule (300 mg total) by mouth 4 (four) times daily.   hydrochlorothiazide  (HYDRODIURIL ) 25 MG tablet Take 1 tablet (25 mg total) by mouth daily. To start, take half tablet daily, only if home blood pressures are over 140/90   levonorgestrel (MIRENA) 20 MCG/DAY IUD by Intrauterine route.   lisinopril  (ZESTRIL ) 40 MG  tablet Take 1  tablet (40 mg total) by mouth daily. History of tubal but can't take if pregnant.   methocarbamol (ROBAXIN) 500 MG tablet Take 500 mg by mouth 4 (four) times daily as needed.   Olopatadine  HCl 0.2 % SOLN Apply 2 drops to eye daily at 6 (six) AM.   omeprazole  (PRILOSEC) 20 MG capsule Take 1 capsule by mouth once daily   ondansetron  (ZOFRAN -ODT) 4 MG disintegrating tablet Take 4 mg by mouth 3 (three) times daily as needed.   ondansetron  (ZOFRAN -ODT) 4 MG disintegrating tablet Take 1 tablet (4 mg total) by mouth every 8 (eight) hours as needed for nausea or vomiting.   senna (SENOKOT) 8.6 MG tablet Take 1 tablet (8.6 mg total) by mouth daily.   VENTOLIN HFA 108 (90 Base) MCG/ACT inhaler Inhale 2 puffs into the lungs every 4 (four) hours as needed for shortness of breath.   No current facility-administered medications on file prior to visit.   Medications Discontinued During This Encounter  Medication Reason   Buprenorphine  HCl-Naloxone  HCl (SUBOXONE ) 8-2 MG FILM Reorder        Virtual Physical Exam Physical Exam  Exam Context: Evaluation limited by virtual format; however, patient is clearly visualized, cooperative, and engaged throughout. General Appearance: Well-developed, well-nourished; no acute distress by limited video assessment. Pulmonary: No respiratory distress apparent; normal work of breathing observed. Neurological: Patient is awake, alert, and demonstrates no obvious focal neurological deficits or cognitive impairments; sensorium appears unclouded. Psychiatric/Mental Status: Mood is appropriate; demeanor is pleasant, calm, and articulate. Speech is coherent and goal-directed with no evidence of slurred or pressured speech. No abnormal psychomotor activity noted. Substance Misuse Indicators: Pupils appear symmetric and reactive as far as can be assessed via video. No track marks, skin lesions, or other stigmata of substance misuse visible. No signs of intoxication or  withdrawal are evident.   Office Visit on 11/18/2023  Component Date Value   Buprenorphine  11/18/2023 Comment    Ethyl Glucuronide 11/18/2023 Comment    Amphetamines 11/18/2023 Negative    Barbiturates 11/18/2023 Negative    Benzodiazepines 11/18/2023 Negative    Cocaine Metabolite 11/18/2023 Negative    Phencyclidine (PCP) 11/18/2023 Negative    Marijuana MTB (THC) 11/18/2023 Comment    6-Acetylmorphine 11/18/2023 Negative    Opiates 11/18/2023 Negative    Oxycodone 11/18/2023 Negative    Tapentadol 11/18/2023 Negative    Fentanyl 11/18/2023 Negative    Methadone  11/18/2023 Negative    Propoxyphene 11/18/2023 Negative    Tramadol  11/18/2023 Negative    Carisoprodol 11/18/2023 Negative    Gabapentin  11/18/2023 Negative    Creatinine 11/18/2023 100    Urine pH 11/18/2023 7.5    Nitrites 11/18/2023 Negative    BUPRENORPHINE  11/18/2023 ++POSITIVE++ (A)    Buprenorphine /CR 11/18/2023 35    Norbuprenorphine/CR 11/18/2023 200    Norbup/Bup Ratio 11/18/2023 5.71    OPIATE ANTAGONIST 11/18/2023 ++POSITIVE++ (A)    Naloxone  11/18/2023 33    CANNABINOIDS 11/18/2023 ++POSITIVE++ (A)    THC/CR Ratio 11/18/2023 92    ETHANOL BIOMARKERS 11/18/2023 ++POSITIVE++ (A)    Ethyl Glucuronide 11/18/2023 >50000    Ethyl Sulfate 11/18/2023 7,878   No image results found. No results found.US  BREAST LTD UNI LEFT INC AXILLA Result Date: 02/19/2023 CLINICAL DATA:  Patient presents to the emergency room for possible left breast infection/abscess. EXAM: ULTRASOUND OF THE LEFT BREAST COMPARISON:  Previous exam(s). FINDINGS: Note that this exam was performed through the emergency department as there was no breast imaging radiologist present for the exam. Targeted ultrasound is performed,  showing a superficial complex fluid collection within the thickened skin of the area of concern over the upper central periareolar region. This collection as a small component extending deeper into the breast tissue and may  be an evolving abscess. IMPRESSION: Superficial complex fluid collection within the skin of the upper central periareolar region of the left breast. In the proper clinical setting, this is compatible with superficial infection/mastitis with small deeper component possibly evolving abscess. RECOMMENDATION: Recommend initiating 7-10 day course of antibiotics as well as further complete diagnostic imaging workup with mammogram/ultrasound at the [facility]. I have discussed the findings and recommendations with the patient. If applicable, a reminder letter will be sent to the patient regarding the next appointment. BI-RADS CATEGORY  0: Incomplete. Need additional imaging evaluation and/or prior mammograms for comparison. Electronically Signed   By: Roda Cirri M.D.   On: 02/19/2023 19:34      Assessment & Plan Encounter for monitoring Suboxone  maintenance therapy Video appointment indicates she doing well visually and verbally. She would like to continue on the same dose which is prescribed to the same pharmacy as last month. High risk medication use I encouraged her to send in pill counts to demonstrate she is taking it as prescribed PDMP reviewed during this encounter. Reviewed urine drug screening report from November this was prior to starting the medicine if she still had alcohol  in her system when she had borrowed this buprenorphine  from the street source per report which is part of what got her to present for treatment History of drug use disorder She denies any relapses reports consistent use of Suboxone  seems the substance use disorder is in deep remission improperly being treated Opioid dependence with opioid-induced disorder (HCC) I encouraged her to do 12-step meetings and try to find a sponsor        Orders Placed During this Encounter:  No orders of the defined types were placed in this encounter.  Meds ordered this encounter  Medications   Buprenorphine  HCl-Naloxone   HCl (SUBOXONE ) 8-2 MG FILM    Sig: Place 1 Film under the tongue daily at 6 (six) AM.    Dispense:  30 each    Refill:  0    Treatment plan discussed and reviewed in detail. Explained medication safety and potential side effects.  Answered all patient questions and confirmed understanding and comfort with the plan. Encouraged patient to contact our office if they have any questions or concerns.  Agreed on patient coming for a sooner office visit if symptoms worsen, persist, or new symptoms develop. Discussed precautions in case of needing to visit the Emergency Department.    ----------------------------------------------------- Attestation:  Today's Healthcare Provider Anthon Kins, MD was located at office at [facility] [address], [place] [address].  The patient was located at home. All video encounter participant identities and locations confirmed visually and verbally.Today's Telemedicine visit was conducted via synchronous Video after consent for telemedicine was obtained:  Video connection was never lost    This document was transcribed and resynthesized, in part, by artificial intelligence (Abridge) using HIPAA-compliant recording of the clinical interaction;   We have discussed the our use of AI scribe software for clinical note transcription with the patient, who has given verbal consent to proceed.

## 2024-07-15 ENCOUNTER — Encounter

## 2024-07-23 ENCOUNTER — Telehealth: Payer: Self-pay | Admitting: *Deleted

## 2024-07-23 ENCOUNTER — Telehealth

## 2024-07-23 NOTE — Telephone Encounter (Signed)
 Copied from CRM [id number]. Topic: Clinical - Medication Question >> Jul 23, 2024  8:14 AM Marissa P wrote: Reason for CRM: Patient is completely out of here meds after today and was hoping for an appt very soon, nothing is available please advise.

## 2024-07-27 ENCOUNTER — Encounter: Payer: Self-pay | Admitting: Internal Medicine

## 2024-07-27 ENCOUNTER — Telehealth (INDEPENDENT_AMBULATORY_CARE_PROVIDER_SITE_OTHER): Admitting: Internal Medicine

## 2024-07-27 DIAGNOSIS — F1129 Opioid dependence with unspecified opioid-induced disorder: Secondary | ICD-10-CM

## 2024-07-27 MED ORDER — BUPRENORPHINE HCL-NALOXONE HCL 8-2 MG SL FILM: 1.0000 | ORAL_FILM | Freq: Every day | SUBLINGUAL | 0 refills | Status: DC

## 2024-07-27 NOTE — Assessment & Plan Note (Signed)
 Opioid dependence on medication-assisted therapy   Currently on Suboxone , 8 mg BID, with effective control and no recent cravings. She engages in [facility] but faces difficulty accessing therapy due to availability issues. Send Suboxone  prescription to [facility] in [place]. Schedule follow-up appointment in one month.

## 2024-07-27 NOTE — Progress Notes (Signed)
 ====================================  [facility]: [phone]   --  Virtual Video Medical Office Visit --  Patient: Denise Rubio      Age: 38 y.o.       Sex:  female  Date:   07/27/2024 Today's Healthcare Provider: Bernardino KANDICE Cone, MD  ====================================   Chief Complaint/Reason For Visit: Medication Refill Chart reviewed: has HTN (hypertension); Anxiety; Chronic hepatitis C without hepatic coma (HCC); Insomnia; Tobacco user; History of drug use disorder; PTSD (post-traumatic stress disorder); Weight disorder; Idiopathic peripheral neuropathy; Chronic bronchitis (HCC); Elevated blood pressure reading; High risk medication use; Opioid dependence with opioid-induced disorder (HCC); FH: breast cancer; Encounter for monitoring Suboxone  maintenance therapy; Chronic bilateral low back pain with bilateral sciatica; Neuropathy; and Drug-induced constipation on their problem list..  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 Discussed the use of AI scribe software for clinical note transcription with the patient, who gave verbal consent to proceed. History of Present Illness 38 year old female with opioid use disorder who presents for a Suboxone  refill.  She has been stable on Suboxone , taking 4 mg in the morning and 4 mg in the evening. She has not experienced cravings for opioids and sometimes forgets she is taking Suboxone . She went without her medication for a few days due to difficulty navigating the appointment system but has since resolved this issue.  She is currently not working and is occupied with family responsibilities, including caring for her father and her  children. She is actively involved in online meetings for support and is attempting to get into therapy, though finding availability has been challenging. She plans to take her children to the beach for their birthdays and is involved in supporting their activities, such as football.   Medications reviewed Current Outpatient Medications on File Prior to Visit  Medication Sig   buPROPion  ER (WELLBUTRIN  SR) 100 MG 12 hr tablet Take 1 tablet (100 mg total) by mouth in the morning.   chlorthalidone  (HYGROTON ) 25 MG tablet Take 1 tablet (25 mg total) by mouth daily. Take in am, replaces hydrochloroTHIAZIDE .   famotidine -calcium carbonate-magnesium hydroxide (PEPCID  COMPLETE) 10-800-165 MG chewable tablet Chew 1 tablet by mouth 2 (two) times daily as needed.   gabapentin  (NEURONTIN ) 300 MG capsule Take 1 capsule (300 mg total) by mouth 4 (four) times daily.   hydrochlorothiazide  (HYDRODIURIL ) 25 MG tablet Take 1 tablet (25 mg total) by mouth daily. To start, take half tablet daily, only if home blood pressures are over 140/90   levonorgestrel (MIRENA) 20 MCG/DAY IUD by Intrauterine route.   lisinopril  (ZESTRIL ) 40 MG tablet Take 1 tablet (40 mg total) by mouth daily. History of tubal but can't take if pregnant.   methocarbamol (ROBAXIN) 500 MG tablet Take 500 mg by mouth 4 (four) times daily as needed.   Olopatadine  HCl 0.2 % SOLN Apply 2 drops to eye daily at 6 (six) AM.   omeprazole  (PRILOSEC) 20 MG capsule Take 1 capsule by mouth once daily   ondansetron  (ZOFRAN -ODT) 4 MG disintegrating tablet Take 4 mg by mouth 3 (three) times daily as needed.   ondansetron  (ZOFRAN -ODT) 4 MG disintegrating tablet Take 1 tablet (4 mg total) by mouth every 8 (eight) hours as needed for nausea or vomiting.   senna (SENOKOT) 8.6 MG tablet  Take 1 tablet (8.6 mg total) by mouth daily.   VENTOLIN HFA 108 (90 Base) MCG/ACT inhaler Inhale 2 puffs into the lungs every 4 (four) hours as needed for shortness of breath.   No  current facility-administered medications on file prior to visit.   Medications Discontinued During This Encounter  Medication Reason   Buprenorphine  HCl-Naloxone  HCl (SUBOXONE ) 8-2 MG FILM Reorder        Virtual Physical Exam Physical Exam  Exam Context: Evaluation limited by virtual format; however, patient is clearly visualized, cooperative, and engaged throughout. General Appearance: Well-developed, well-nourished; no acute distress by limited video assessment. Pulmonary: No respiratory distress apparent; normal work of breathing observed. Neurological: Patient is awake, alert, and demonstrates no obvious focal neurological deficits or cognitive impairments; sensorium appears unclouded. Psychiatric/Mental Status: Mood is appropriate; demeanor is pleasant, calm, and articulate. Speech is coherent and goal-directed with no evidence of slurred or pressured speech. No abnormal psychomotor activity noted. Substance Misuse Indicators: Pupils appear symmetric and reactive as far as can be assessed via video. No track marks, skin lesions, or other stigmata of substance misuse visible. No signs of intoxication or withdrawal are evident.  Office Visit on 11/18/2023  Component Date Value   Buprenorphine  11/18/2023 Comment    Ethyl Glucuronide 11/18/2023 Comment    Amphetamines 11/18/2023 Negative    Barbiturates 11/18/2023 Negative    Benzodiazepines 11/18/2023 Negative    Cocaine Metabolite 11/18/2023 Negative    Phencyclidine (PCP) 11/18/2023 Negative    Marijuana MTB (THC) 11/18/2023 Comment    6-Acetylmorphine 11/18/2023 Negative    Opiates 11/18/2023 Negative    Oxycodone 11/18/2023 Negative    Tapentadol 11/18/2023 Negative    Fentanyl 11/18/2023 Negative    Methadone  11/18/2023 Negative    Propoxyphene 11/18/2023 Negative    Tramadol  11/18/2023 Negative    Carisoprodol 11/18/2023 Negative    Gabapentin  11/18/2023 Negative    Creatinine 11/18/2023 100    Urine pH 11/18/2023 7.5     Nitrites 11/18/2023 Negative    BUPRENORPHINE  11/18/2023 ++POSITIVE++ (A)    Buprenorphine /CR 11/18/2023 35    Norbuprenorphine/CR 11/18/2023 200    Norbup/Bup Ratio 11/18/2023 5.71    OPIATE ANTAGONIST 11/18/2023 ++POSITIVE++ (A)    Naloxone  11/18/2023 33    CANNABINOIDS 11/18/2023 ++POSITIVE++ (A)    THC/CR Ratio 11/18/2023 92    ETHANOL BIOMARKERS 11/18/2023 ++POSITIVE++ (A)    Ethyl Glucuronide 11/18/2023 >50000    Ethyl Sulfate 11/18/2023 7,878   No image results found. No results found.US  BREAST LTD UNI LEFT INC AXILLA Result Date: 02/19/2023 CLINICAL DATA:  Patient presents to the emergency room for possible left breast infection/abscess. EXAM: ULTRASOUND OF THE LEFT BREAST COMPARISON:  Previous exam(s). FINDINGS: Note that this exam was performed through the emergency department as there was no breast imaging radiologist present for the exam. Targeted ultrasound is performed, showing a superficial complex fluid collection within the thickened skin of the area of concern over the upper central periareolar region. This collection as a small component extending deeper into the breast tissue and may be an evolving abscess. IMPRESSION: Superficial complex fluid collection within the skin of the upper central periareolar region of the left breast. In the proper clinical setting, this is compatible with superficial infection/mastitis with small deeper component possibly evolving abscess. RECOMMENDATION: Recommend initiating 7-10 day course of antibiotics as well as further complete diagnostic imaging workup with mammogram/ultrasound at the [facility]. I have discussed the findings and recommendations with the patient. If applicable, a reminder letter will be sent  to the patient regarding the next appointment. BI-RADS CATEGORY  0: Incomplete. Need additional imaging evaluation and/or prior mammograms for comparison. Electronically Signed   By: Toribio Agreste M.D.   On: 02/19/2023 19:34       Assessment & Plan Opioid dependence with opioid-induced disorder (HCC) Opioid dependence on medication-assisted therapy   Currently on Suboxone , 8 mg BID, with effective control and no recent cravings. She engages in [employer] but faces difficulty accessing therapy due to availability issues. Send Suboxone  prescription to [facility] in [place]. Schedule follow-up appointment in one month.       Orders Placed During this Encounter:  No orders of the defined types were placed in this encounter.  Meds ordered this encounter  Medications   Buprenorphine  HCl-Naloxone  HCl (SUBOXONE ) 8-2 MG FILM    Sig: Place 1 Film under the tongue daily at 6 (six) AM.    Dispense:  30 each    Refill:  0    Treatment plan discussed and reviewed in detail. Explained medication safety and potential side effects.  Answered all patient questions and confirmed understanding and comfort with the plan. Encouraged patient to contact our office if they have any questions or concerns.  Agreed on patient coming for a sooner office visit if symptoms worsen, persist, or new symptoms develop. Discussed precautions in case of needing to visit the Emergency Department.    ----------------------------------------------------- Attestation:  Today's Healthcare Provider Bernardino KANDICE Cone, MD was located at office at [facility] [address], [place] [address].  The patient was located at home. All video encounter participant identities and locations confirmed visually and verbally.Today's Telemedicine visit was conducted via synchronous Video after consent for telemedicine was obtained:  Video connection was never lost    This document was transcribed and resynthesized, in part, by artificial intelligence (Abridge) using HIPAA-compliant recording of the clinical interaction;   We have discussed the our use of AI scribe software for clinical note transcription with the patient, who has  given verbal consent to proceed.

## 2024-07-27 NOTE — Patient Instructions (Signed)
 It was a pleasure seeing you today! Your health and satisfaction are our top priorities.  Denise Cone, MD  VISIT SUMMARY: Denise Denise, you came in today for a refill of your Suboxone  prescription. You have been stable on your current dose and have not experienced any cravings for opioids. You mentioned some difficulty in navigating the appointment system but have resolved this issue. You are currently not working and are busy with family responsibilities, including caring for your father and children. You are actively participating in online support meetings and are trying to get into therapy, although availability has been a challenge.  YOUR PLAN: -OPIOID DEPENDENCE ON MEDICATION-ASSISTED THERAPY: Opioid dependence is a condition where the body relies on opioids, and medication-assisted therapy like Suboxone  helps manage this dependence. You are currently taking Suboxone  8 mg twice a day, which has been effective in controlling your cravings. We will send your Suboxone  prescription to [facility] in [place]. Please continue attending your online support meetings and keep trying to get into therapy.  INSTRUCTIONS: Please schedule a follow-up appointment in one month.  Your Providers PCP: Denise Denise MATSU, MD,  [phone]) Referring Provider: [name] MATSU, MD,  [phone])  NEXT STEPS: [x]  Early Intervention: Schedule sooner appointment, call our on-call services, or go to emergency room if there is any significant Increase in pain or discomfort New or worsening symptoms Sudden or severe changes in your health [x]  Flexible Follow-Up: We recommend a No follow-ups on file. for optimal routine care. This allows for progress monitoring and treatment adjustments. [x]  Preventive Care: Schedule your annual preventive care visit! It's typically covered by insurance and helps identify potential health issues early. [x]  Lab & X-ray Appointments: Incomplete tests scheduled today, or call to  schedule. X-rays: [facility] (M-F, 8:30am-noon or 1pm-5pm). [x]  Medical Information Release: Sign a release form at front desk to obtain relevant medical information we don't have.  MAKING THE MOST OF OUR FOCUSED 20 MINUTE APPOINTMENTS: [x]   Clearly state your top concerns at the beginning of the visit to focus our discussion [x]   If you anticipate you will need more time, please inform the front desk during scheduling - we can book multiple appointments in the same week. [x]   If you have transportation problems- use our convenient video appointments or ask about transportation support. [x]   We can get down to business faster if you use MyChart to update information before the visit and submit non-urgent questions before your visit. Thank you for taking the time to provide details through MyChart.  Let our nurse know and she can import this information into your encounter documents.  Arrival and Wait Times: [x]   Arriving on time ensures that everyone receives prompt attention. [x]   Early morning (8a) and afternoon (1p) appointments tend to have shortest wait times. [x]   Unfortunately, we cannot delay appointments for late arrivals or hold slots during phone calls.  Getting Answers and Following Up [x]   Simple Questions & Concerns: For quick questions or basic follow-up after your visit, reach us  at [phone] or MyChart messaging. [x]   Complex Concerns: If your concern is more complex, scheduling an appointment might be best. Discuss this with the staff to find the most suitable option. [x]   Lab & Imaging Results: We'll contact you directly if results are abnormal or you don't use MyChart. Most normal results will be on MyChart within 2-3 business days, with a review message from Dr. Cone. Haven't heard back in 2 weeks? Need results sooner?  Contact us  at [phone]. [x]   Referrals: Our referral coordinator will manage specialist referrals. The specialist's office  should contact you within 2 weeks to schedule an appointment. Call us  if you haven't heard from them after 2 weeks.  Staying Connected [x]   MyChart: Activate your MyChart for the fastest way to access results and message us . See the last page of this paperwork for instructions on how to activate.  Bring to Your Next Appointment [x]   Medications: Please bring all your medication bottles to your next appointment to ensure we have an accurate record of your prescriptions. [x]   Health Diaries: If you're monitoring any health conditions at home, keeping a diary of your readings can be very helpful for discussions at your next appointment.  Billing [x]   X-ray & Lab Orders: These are billed by separate companies. Contact the invoicing Denise directly for questions or concerns. [x]   Visit Charges: Discuss any billing inquiries with our administrative services team.  Your Satisfaction Matters [x]   Share Your Experience: We strive for your satisfaction! If you have any complaints, or preferably compliments, please let Dr. Jesus know directly or contact our Practice Administrators, Denise Denise or Denise Denise, by asking at the front desk.   Reviewing Your Records [x]   Review this early draft of your clinical encounter notes below and the final encounter summary tomorrow on MyChart after its been completed.  All orders placed so far are visible here: Opioid dependence with opioid-induced disorder (HCC) -     Buprenorphine  HCl-Naloxone  HCl; Place 1 Film under the tongue daily at 6 (six) AM.  Dispense: 30 each; Refill: 0

## 2024-07-28 ENCOUNTER — Ambulatory Visit: Admitting: Internal Medicine

## 2024-08-27 ENCOUNTER — Telehealth (INDEPENDENT_AMBULATORY_CARE_PROVIDER_SITE_OTHER): Admitting: Internal Medicine

## 2024-08-27 ENCOUNTER — Telehealth: Payer: Self-pay

## 2024-08-27 ENCOUNTER — Other Ambulatory Visit: Payer: Self-pay | Admitting: Internal Medicine

## 2024-08-27 ENCOUNTER — Encounter: Payer: Self-pay | Admitting: Internal Medicine

## 2024-08-27 DIAGNOSIS — Z5181 Encounter for therapeutic drug level monitoring: Secondary | ICD-10-CM | POA: Diagnosis not present

## 2024-08-27 DIAGNOSIS — M21612 Bunion of left foot: Secondary | ICD-10-CM | POA: Diagnosis not present

## 2024-08-27 DIAGNOSIS — E871 Hypo-osmolality and hyponatremia: Secondary | ICD-10-CM

## 2024-08-27 DIAGNOSIS — Z79891 Long term (current) use of opiate analgesic: Secondary | ICD-10-CM

## 2024-08-27 DIAGNOSIS — S93142A Subluxation of metatarsophalangeal joint of left great toe, initial encounter: Secondary | ICD-10-CM | POA: Diagnosis not present

## 2024-08-27 DIAGNOSIS — Z79899 Other long term (current) drug therapy: Secondary | ICD-10-CM | POA: Diagnosis not present

## 2024-08-27 DIAGNOSIS — F1129 Opioid dependence with unspecified opioid-induced disorder: Secondary | ICD-10-CM

## 2024-08-27 DIAGNOSIS — D72829 Elevated white blood cell count, unspecified: Secondary | ICD-10-CM

## 2024-08-27 DIAGNOSIS — R7989 Other specified abnormal findings of blood chemistry: Secondary | ICD-10-CM

## 2024-08-27 DIAGNOSIS — R8290 Unspecified abnormal findings in urine: Secondary | ICD-10-CM

## 2024-08-27 MED ORDER — BUPRENORPHINE HCL-NALOXONE HCL 8-2 MG SL FILM: 1.0000 | ORAL_FILM | Freq: Every day | SUBLINGUAL | 0 refills | Status: DC

## 2024-08-27 NOTE — Assessment & Plan Note (Signed)
 Opioid dependence, stable on buprenorphine -naloxone  therapy   Her opioid dependence is well-managed with the current buprenorphine -naloxone  regimen. She reports no cravings or side effects and maintains sobriety and adherence to the medication regimen. There are no dental issues due to the absence of natural teeth. Continue the current buprenorphine -naloxone  therapy, prescribing 8 mg films daily, 30 films with no refills,  PDMP reviewed during this encounter. Encouraged follow up in clinic when possible.

## 2024-08-27 NOTE — Progress Notes (Signed)
 ====================================  [facility]: [phone]   --  Virtual Video Medical Office Visit --  Patient: Denise Rubio      Age: 38 y.o.       Sex:  female  Date:   08/27/2024 Today's Healthcare Provider: Bernardino KANDICE Cone, MD  ===================================    Chief Complaint/Reason For Visit: Foot pain, suboxone  management.    Chart reviewed: has HTN (hypertension); Anxiety; Chronic hepatitis C without hepatic coma (HCC); Insomnia; Tobacco user; History of drug use disorder; PTSD (post-traumatic stress disorder); Weight disorder; Idiopathic peripheral neuropathy; Chronic bronchitis (HCC); Elevated blood pressure reading; High risk medication use; Opioid dependence with opioid-induced disorder (HCC); FH: breast cancer; Encounter for monitoring Suboxone  maintenance therapy; Chronic bilateral low back pain with bilateral sciatica; Neuropathy; Drug-induced constipation; and Subluxation of metatarsophalangeal joint of great toe, left, initial encounter on their problem list. 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 Discussed the use of AI scribe software for clinical note transcription with the patient, who gave verbal consent to proceed.  History of Present Illness 38 year old female who presents with left foot pain and suspected bunion, and ongoing suboxone  management.  She has been experiencing sharp, lightning-like pains in her left foot for the past three weeks, described as feeling like walking on prickly objects, accompanied by a burning sensation. The pain is primarily located at the base of the great toe on the left foot, where she has noticed an outgrowth. It  is exacerbated by standing for prolonged periods, such as when cooking, and she finds relief by propping her foot up. She can only stand for about 20 to 30 minutes before needing to sit down. The pain sometimes occurs while driving, necessitating pulling over until it subsides.  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 She also mentions wearing tight shoes, which may have contributed to the issue. The area around the bunion is very tender and slightly red. She describes the pain as feeling like a cigarette burn and notes difficulty in bending her big toe down without using her hand. No loss of nerve function or blood flow in the foot is reported.  She has a history of IV drug use, which she believes may have caused some nerve damage in her hands. She has been sober for nearly six years. Her current medication includes Suboxone , taken as one 8 mg film per day, which she reports is effective in controlling her cravings without side effects.   Medications reviewed: Current Outpatient Medications on File Prior to Visit  Medication Sig   buPROPion  ER (WELLBUTRIN  SR) 100 MG 12 hr tablet Take 1 tablet (100 mg total) by mouth in the morning.   chlorthalidone  (HYGROTON ) 25 MG tablet Take 1 tablet (25 mg total) by mouth daily. Take in am, replaces hydrochloroTHIAZIDE .   famotidine -calcium carbonate-magnesium hydroxide (PEPCID  COMPLETE) 10-800-165 MG chewable tablet Chew 1 tablet by mouth 2 (two) times daily as needed.   gabapentin  (NEURONTIN ) 300 MG capsule Take 1 capsule (300 mg total) by mouth 4 (four) times daily.   hydrochlorothiazide  (HYDRODIURIL ) 25 MG tablet Take 1 tablet (25 mg total) by mouth daily. To start, take half tablet  daily, only if home blood pressures are over 140/90   levonorgestrel (MIRENA) 20 MCG/DAY IUD by Intrauterine route.   lisinopril  (ZESTRIL ) 40 MG tablet Take 1  tablet (40 mg total) by mouth daily. History of tubal but can't take if pregnant.   methocarbamol (ROBAXIN) 500 MG tablet Take 500 mg by mouth 4 (four) times daily as needed.   Olopatadine  HCl 0.2 % SOLN Apply 2 drops to eye daily at 6 (six) AM.   omeprazole  (PRILOSEC) 20 MG capsule Take 1 capsule by mouth once daily   ondansetron  (ZOFRAN -ODT) 4 MG disintegrating tablet Take 4 mg by mouth 3 (three) times daily as needed.   ondansetron  (ZOFRAN -ODT) 4 MG disintegrating tablet Take 1 tablet (4 mg total) by mouth every 8 (eight) hours as needed for nausea or vomiting.   senna (SENOKOT) 8.6 MG tablet Take 1 tablet (8.6 mg total) by mouth daily.   VENTOLIN HFA 108 (90 Base) MCG/ACT inhaler Inhale 2 puffs into the lungs every 4 (four) hours as needed for shortness of breath.   No current facility-administered medications on file prior to visit.   Medications Discontinued During This Encounter  Medication Reason   Buprenorphine  HCl-Naloxone  HCl (SUBOXONE ) 8-2 MG FILM Reorder       Virtual Physical Exam: Physical Exam EXTREMITIES: Outgrowth at the base of the left great toe. Tenderness and redness at the bunion site on the left foot. Pain with range of motion of the left great toe.  Inspection:  Mild to moderate lateral deviation of the left hallux at the first metatarsophalangeal (MTP) joint, consistent with hallux valgus deformity.  Prominent medial eminence at the base of the left great toe, without overlying erythema, swelling, or ulceration.  No visible calluses, hyperkeratotic lesions, or skin breakdown on the medial aspect of the first MTP or adjacent toes.  No evidence of hammertoe, claw toe, or other digital deformities.[5]  No signs of infection (no redness, warmth, or drainage). Palpation:  Point tenderness localized to the medial aspect of the left first MTP joint over the bunion.  Exam Context: Evaluation limited by virtual format; however, patient is clearly visualized,  cooperative, and engaged throughout. General Appearance: Well-developed, well-nourished; no acute distress by limited video assessment. Pulmonary: No respiratory distress apparent; normal work of breathing observed. Neurological: Patient is awake, alert, and demonstrates no obvious focal neurological deficits or cognitive impairments; sensorium appears unclouded. Psychiatric/Mental Status: Mood is appropriate; demeanor is pleasant, calm, and articulate. Speech is coherent and goal-directed with no evidence of slurred or pressured speech. No abnormal psychomotor activity noted. Substance Misuse Indicators: Pupils appear symmetric and reactive as far as can be assessed via video. No track marks, skin lesions, or other stigmata of substance misuse visible. No signs of intoxication or withdrawal are evident.         No results found for any visits on 08/27/24. Office Visit on 11/18/2023  Component Date Value   Buprenorphine  11/18/2023 Comment    Ethyl Glucuronide 11/18/2023 Comment    Amphetamines 11/18/2023 Negative    Barbiturates 11/18/2023 Negative    Benzodiazepines 11/18/2023 Negative    Cocaine Metabolite 11/18/2023 Negative    Phencyclidine (PCP) 11/18/2023 Negative    Marijuana MTB (THC) 11/18/2023 Comment    6-Acetylmorphine 11/18/2023 Negative    Opiates 11/18/2023 Negative    Oxycodone 11/18/2023 Negative    Tapentadol 11/18/2023 Negative    Fentanyl 11/18/2023 Negative    Methadone  11/18/2023 Negative    Propoxyphene 11/18/2023 Negative    Tramadol  11/18/2023 Negative  Carisoprodol 11/18/2023 Negative    Gabapentin  11/18/2023 Negative    Creatinine 11/18/2023 100    Urine pH 11/18/2023 7.5    Nitrites 11/18/2023 Negative    BUPRENORPHINE  11/18/2023 ++POSITIVE++ (A)    Buprenorphine /CR 11/18/2023 35    Norbuprenorphine/CR 11/18/2023 200    Norbup/Bup Ratio 11/18/2023 5.71    OPIATE ANTAGONIST 11/18/2023 ++POSITIVE++ (A)    Naloxone  11/18/2023 33    CANNABINOIDS  11/18/2023 ++POSITIVE++ (A)    THC/CR Ratio 11/18/2023 92    ETHANOL BIOMARKERS 11/18/2023 ++POSITIVE++ (A)    Ethyl Glucuronide 11/18/2023 >50000    Ethyl Sulfate 11/18/2023 7,878   No image results found. No results found.US  BREAST LTD UNI LEFT INC AXILLA Result Date: 02/19/2023 CLINICAL DATA:  Patient presents to the emergency room for possible left breast infection/abscess. EXAM: ULTRASOUND OF THE LEFT BREAST COMPARISON:  Previous exam(s). FINDINGS: Note that this exam was performed through the emergency department as there was no breast imaging radiologist present for the exam. Targeted ultrasound is performed, showing a superficial complex fluid collection within the thickened skin of the area of concern over the upper central periareolar region. This collection as a small component extending deeper into the breast tissue and may be an evolving abscess. IMPRESSION: Superficial complex fluid collection within the skin of the upper central periareolar region of the left breast. In the proper clinical setting, this is compatible with superficial infection/mastitis with small deeper component possibly evolving abscess. RECOMMENDATION: Recommend initiating 7-10 day course of antibiotics as well as further complete diagnostic imaging workup with mammogram/ultrasound at the [facility]. I have discussed the findings and recommendations with the patient. If applicable, a reminder letter will be sent to the patient regarding the next appointment. BI-RADS CATEGORY  0: Incomplete. Need additional imaging evaluation and/or prior mammograms for comparison. Electronically Signed   By: Toribio Agreste M.D.   On: 02/19/2023 19:34       ASSESSMENT & PLAN   Assessment & Plan Bunion of great toe of left foot Subluxation of metatarsophalangeal joint of great toe, left, initial encounter Bunion and subluxation of left great toe metatarsophalangeal joint   Chronic pain and tenderness at the base of her  left great toe likely result from a bunion, with pain worsening during standing and walking and alleviated by elevation. Tight footwear may contribute. Although infection due to past IV drug use is a differential, her last use was nearly six years ago, and there are no signs of acute infection. An urgent evaluation is necessary to rule out dormant infection. An urgent referral to podiatry is placed for evaluation and management. Imaging such as x-ray or MRI may be needed to assess for infection or structural issues. Surgical intervention will be discussed if a bunion is confirmed.  Most Likely Diagnoses The most likely diagnosis is hallux valgus (bunion) with mechanical irritation or bursitis of the first metatarsophalangeal (MTP) joint. This is supported by point tenderness over the bunion, pain modulated by toe movement, history of poor footwear, and preserved range of motion. Bunions are common in adults and often present with localized pain, especially when aggravated by mechanical stress or footwear, without systemic symptoms or signs of infection.[1] Mechanical bursitis or synovitis may coexist, given the tenderness and pain with joint movement. Other considerations include capsulitis or synovitis of the first MTP joint, which can present with focal tenderness and pain on movement, particularly in the setting of biomechanical stress. The [employer] notes that mechanical etiologies are  common and recommends offloading and orthotic therapy as first-line management. Most Important Not to Miss Diagnoses Septic arthritis or osteomyelitis of the first MTP joint must be excluded, but the absence of fever, systemic symptoms, and risk factors (no diabetes, no current immunosuppression, remote IVDU) makes this unlikely. The Infectious Diseases Society of America recommends considering joint aspiration and laboratory evaluation if infection is suspected. Primary or metastatic  bone/soft-tissue malignancy is rare but should be considered if there is a rapidly enlarging, deep, or firm mass, or unexplained persistent pain. The [employer] recommends MRI or CT for indeterminate lesions on radiographs.[4] Key Additional History and Follow-Up Tests  Weight-bearing foot radiographs to assess for bony deformity, joint space narrowing, or other pathology. Defer to podiatry  Consider ultrasound or MRI if atypical features, persistent pain, or suspicion for soft tissue or bony pathology. Defer to podiatry  Laboratory studies (CBC, ESR, CRP) only if infection or inflammatory arthritis is suspected.  Suspicious is low for me  Podiatry referral for biomechanical assessment and management. First-line management includes footwear modification, use of protective pads or orthoses, and NSAIDs for pain control. Surgical intervention is reserved for refractory cases or severe deformity. Encounter for monitoring Suboxone  maintenance therapy High risk medication use Opioid dependence with opioid-induced disorder (HCC) Opioid dependence, stable on buprenorphine -naloxone  therapy   Her opioid dependence is well-managed with the current buprenorphine -naloxone  regimen. She reports no cravings or side effects and maintains sobriety and adherence to the medication regimen. There are no dental issues due to the absence of natural teeth. Continue the current buprenorphine -naloxone  therapy, prescribing 8 mg films daily, 30 films with no refills,  PDMP reviewed during this encounter. Encouraged follow up in clinic when possible.   ORDER ASSOCIATIONS  #   DIAGNOSIS / CONDITION ICD-10 ENCOUNTER ORDER     ICD-10-CM   1. Bunion of great toe of left foot  M21.612 Ambulatory referral to Podiatry    2. Subluxation of metatarsophalangeal joint of great toe, left, initial encounter  S93.142A     3. Encounter for monitoring Suboxone  maintenance therapy  Z51.81    Z79.891     4. High risk  medication use  Z79.899     5. Opioid dependence with opioid-induced disorder (HCC)  F11.29 Buprenorphine  HCl-Naloxone  HCl (SUBOXONE ) 8-2 MG FILM           Orders Placed in Encounter:   Lab Orders  No laboratory test(s) ordered today   Imaging Orders  No imaging studies ordered today   Referral Orders         Ambulatory referral to Podiatry     Meds ordered this encounter  Medications   Buprenorphine  HCl-Naloxone  HCl (SUBOXONE ) 8-2 MG FILM    Sig: Place 1 Film under the tongue daily at 6 (six) AM.    Dispense:  30 each    Refill:  0    Orders Placed This Encounter  Procedures   Ambulatory referral to Podiatry    Referral Priority:   Urgent    Referral Type:   Consultation    Referral Reason:   Specialty Services Required    Requested Specialty:   Podiatry    Number of Visits Requested:   1          Treatment plan discussed and reviewed in detail. Explained medication safety and potential side effects.  Answered all patient questions and confirmed understanding and comfort with the plan. Encouraged patient to contact our office if they have any questions or concerns.  Agreed on patient coming for a sooner office visit if symptoms worsen, persist, or new symptoms develop. Discussed precautions in case of needing to visit the Emergency Department.    ----------------------------------------------------- Attestation:  Today's Healthcare Provider Bernardino KANDICE Cone, MD was located at office at [facility] [address] [place] [address].  The patient was located at home. All video encounter participant identities and locations confirmed visually and verbally.Today's Telemedicine visit was conducted via synchronous Video after consent for telemedicine was obtained:  Video connection was never lost   This document was transcribed and resynthesized, in part, by artificial intelligence ([employer])  using HIPAA-compliant recording of the clinical  interaction;   We have discussed the our use of AI scribe software for clinical note transcription with the patient, who has given verbal consent to proceed.

## 2024-08-27 NOTE — Patient Instructions (Signed)
 It was a pleasure seeing you today! Your health and satisfaction are our top priorities.  Denise Cone, MD  VISIT SUMMARY: During your visit, we discussed the sharp, burning pain in your left foot, which has been troubling you for the past three weeks. We also reviewed your history of opioid dependence and your current treatment with Suboxone .  YOUR PLAN: -BUNION AND SUBLUXATION OF LEFT GREAT TOE METATARSOPHALANGEAL JOINT: A bunion is a bony bump that forms on the joint at the base of your big toe, often causing pain and tenderness. Your pain is likely due to this condition, which is worsened by standing and alleviated by elevating your foot. We have referred you to a podiatrist for an urgent evaluation to rule out any infection and to discuss potential treatments, including imaging tests like x-rays or MRI and possibly surgery.  -OPIOID DEPENDENCE, STABLE ON BUPRENORPHINE -NALOXONE  THERAPY: Opioid dependence is a condition where the body relies on opioids, and it can be managed with medications like buprenorphine -naloxone . Your dependence is well-controlled with your current medication, Suboxone , which you are taking as prescribed without any cravings or side effects. We will continue this treatment and have sent a prescription for 30 films of 8 mg Suboxone  to your pharmacy.  INSTRUCTIONS: Please follow up with the podiatrist as soon as possible for your foot pain. Continue taking your Suboxone  as prescribed and maintain your current regimen. If you experience any new symptoms or have concerns, contact our office.  Your Providers PCP: Denise Rubio MATSU, MD,  [phone]) Referring Provider: [name] MATSU, MD,  [phone])  NEXT STEPS: [x]  Early Intervention: Schedule sooner appointment, call our on-call services, or go to emergency room if there is any significant Increase in pain or discomfort New or worsening symptoms Sudden or severe changes in your health [x]  Flexible Follow-Up: We  recommend a Return in about 1 month (around 09/26/2024). for optimal routine care. This allows for progress monitoring and treatment adjustments. [x]  Preventive Care: Schedule your annual preventive care visit! It's typically covered by insurance and helps identify potential health issues early. [x]  Lab & X-ray Appointments: Incomplete tests scheduled today, or call to schedule. X-rays: [facility] (M-F, 8:30am-noon or 1pm-5pm). [x]  Medical Information Release: Sign a release form at front desk to obtain relevant medical information we don't have.  MAKING THE MOST OF OUR FOCUSED 20 MINUTE APPOINTMENTS: [x]   Clearly state your top concerns at the beginning of the visit to focus our discussion [x]   If you anticipate you will need more time, please inform the front desk during scheduling - we can book multiple appointments in the same week. [x]   If you have transportation problems- use our convenient video appointments or ask about transportation support. [x]   We can get down to business faster if you use MyChart to update information before the visit and submit non-urgent questions before your visit. Thank you for taking the time to provide details through MyChart.  Let our nurse know and she can import this information into your encounter documents.  Arrival and Wait Times: [x]   Arriving on time ensures that everyone receives prompt attention. [x]   Early morning (8a) and afternoon (1p) appointments tend to have shortest wait times. [x]   Unfortunately, we cannot delay appointments for late arrivals or hold slots during phone calls.  Getting Answers and Following Up [x]   Simple Questions & Concerns: For quick questions or basic follow-up after your visit, reach us  at [phone] or MyChart messaging. [x]   Complex Concerns:  If your concern is more complex, scheduling an appointment might be best. Discuss this with the staff to find the most suitable option. [x]   Lab & Imaging  Results: We'll contact you directly if results are abnormal or you don't use MyChart. Most normal results will be on MyChart within 2-3 business days, with a review message from Dr. Jesus. Haven't heard back in 2 weeks? Need results sooner? Contact us  at [phone]. [x]   Referrals: Our referral coordinator will manage specialist referrals. The specialist's office should contact you within 2 weeks to schedule an appointment. Call us  if you haven't heard from them after 2 weeks.  Staying Connected [x]   MyChart: Activate your MyChart for the fastest way to access results and message us . See the last page of this paperwork for instructions on how to activate.  Bring to Your Next Appointment [x]   Medications: Please bring all your medication bottles to your next appointment to ensure we have an accurate record of your prescriptions. [x]   Health Diaries: If you're monitoring any health conditions at home, keeping a diary of your readings can be very helpful for discussions at your next appointment.  Billing [x]   X-ray & Lab Orders: These are billed by separate companies. Contact the invoicing company directly for questions or concerns. [x]   Visit Charges: Discuss any billing inquiries with our administrative services team.  Your Satisfaction Matters [x]   Share Your Experience: We strive for your satisfaction! If you have any complaints, or preferably compliments, please let Dr. Jesus know directly or contact our Practice Administrators, Manuelita Rubin or [employer], by asking at the front desk.   Reviewing Your Records [x]   Review this early draft of your clinical encounter notes below and the final encounter summary tomorrow on MyChart after its been completed.  All orders placed so far are visible here: Bunion of great toe of left foot -     Ambulatory referral to Podiatry  Subluxation of metatarsophalangeal joint of great toe, left, initial encounter  Encounter for monitoring  Suboxone  maintenance therapy  High risk medication use  Opioid dependence with opioid-induced disorder (HCC) -     Buprenorphine  HCl-Naloxone  HCl; Place 1 Film under the tongue daily at 6 (six) AM.  Dispense: 30 each; Refill: 0

## 2024-08-27 NOTE — Telephone Encounter (Signed)
 Copied from CRM [id number]. Topic: Clinical - Request for Lab/Test Order >> Aug 27, 2024  9:26 AM Denise Rubio wrote: Reason for CRM: Patient had virtual appointment with provider this morning.He would like for her to have labs done.No orders are placed. Could provider place orders and patient be contacted to get scheduled for labs?  Please advise which labs you would like to be placed for pt?

## 2024-08-27 NOTE — Assessment & Plan Note (Signed)
 Bunion and subluxation of left great toe metatarsophalangeal joint   Chronic pain and tenderness at the base of her left great toe likely result from a bunion, with pain worsening during standing and walking and alleviated by elevation. Tight footwear may contribute. Although infection due to past IV drug use is a differential, her last use was nearly six years ago, and there are no signs of acute infection. An urgent evaluation is necessary to rule out dormant infection. An urgent referral to podiatry is placed for evaluation and management. Imaging such as x-ray or MRI may be needed to assess for infection or structural issues. Surgical intervention will be discussed if a bunion is confirmed.  Most Likely Diagnoses The most likely diagnosis is hallux valgus (bunion) with mechanical irritation or bursitis of the first metatarsophalangeal (MTP) joint. This is supported by point tenderness over the bunion, pain modulated by toe movement, history of poor footwear, and preserved range of motion. Bunions are common in adults and often present with localized pain, especially when aggravated by mechanical stress or footwear, without systemic symptoms or signs of infection.[1] Mechanical bursitis or synovitis may coexist, given the tenderness and pain with joint movement. Other considerations include capsulitis or synovitis of the first MTP joint, which can present with focal tenderness and pain on movement, particularly in the setting of biomechanical stress. The [employer] notes that mechanical etiologies are common and recommends offloading and orthotic therapy as first-line management. Most Important Not to Miss Diagnoses Septic arthritis or osteomyelitis of the first MTP joint must be excluded, but the absence of fever, systemic symptoms, and risk factors (no diabetes, no current immunosuppression, remote IVDU) makes this unlikely. The Infectious Diseases Society of America  recommends considering joint aspiration and laboratory evaluation if infection is suspected. Primary or metastatic bone/soft-tissue malignancy is rare but should be considered if there is a rapidly enlarging, deep, or firm mass, or unexplained persistent pain. The [employer] recommends MRI or CT for indeterminate lesions on radiographs.[4] Key Additional History and Follow-Up Tests  Weight-bearing foot radiographs to assess for bony deformity, joint space narrowing, or other pathology. Defer to podiatry  Consider ultrasound or MRI if atypical features, persistent pain, or suspicion for soft tissue or bony pathology. Defer to podiatry  Laboratory studies (CBC, ESR, CRP) only if infection or inflammatory arthritis is suspected.  Suspicious is low for me  Podiatry referral for biomechanical assessment and management. First-line management includes footwear modification, use of protective pads or orthoses, and NSAIDs for pain control. Surgical intervention is reserved for refractory cases or severe deformity.

## 2024-09-01 ENCOUNTER — Ambulatory Visit: Payer: Self-pay | Admitting: Podiatry

## 2024-09-15 ENCOUNTER — Other Ambulatory Visit: Payer: Self-pay | Admitting: Internal Medicine

## 2024-09-15 DIAGNOSIS — K2100 Gastro-esophageal reflux disease with esophagitis, without bleeding: Secondary | ICD-10-CM

## 2024-09-15 DIAGNOSIS — G629 Polyneuropathy, unspecified: Secondary | ICD-10-CM

## 2024-09-15 NOTE — Telephone Encounter (Unsigned)
 Copied from CRM #[id number]. Topic: Clinical - Medication Refill >> Sep 15, 2024  1:04 PM Dedra B wrote: Medication: omeprazole  (PRILOSEC) 20 MG capsule gabapentin  (NEURONTIN ) 300 MG capsule (not time for refill but out of refills and just want it sent to pharmacy for when it's time)  Has the patient contacted their pharmacy? Yes, out of refills  This is the patient's preferred pharmacy:  [facility] [address]), [place] - [address] [place]) [place] [address] Phone: [phone] Fax: [phone]  Is this the correct pharmacy for this prescription? Yes  Has the prescription been filled recently? No  Is the patient out of the medication? Yes, out of omeprazole . Not out of gabapentin   Has the patient been seen for an appointment in the last year OR does the patient have an upcoming appointment? Yes  Can we respond through MyChart? Yes  Agent: Please be advised that Rx refills may take up to 3 business days. We ask that you follow-up with your pharmacy.

## 2024-09-17 MED ORDER — GABAPENTIN 300 MG PO CAPS: 300.0000 mg | ORAL_CAPSULE | Freq: Four times a day (QID) | ORAL | 2 refills | Status: AC

## 2024-09-17 MED ORDER — OMEPRAZOLE 20 MG PO CPDR: 20.0000 mg | DELAYED_RELEASE_CAPSULE | Freq: Every day | ORAL | 0 refills | Status: DC

## 2024-09-28 ENCOUNTER — Encounter: Payer: Self-pay | Admitting: Internal Medicine

## 2024-09-28 ENCOUNTER — Telehealth (INDEPENDENT_AMBULATORY_CARE_PROVIDER_SITE_OTHER): Admitting: Internal Medicine

## 2024-09-28 DIAGNOSIS — K2100 Gastro-esophageal reflux disease with esophagitis, without bleeding: Secondary | ICD-10-CM | POA: Diagnosis not present

## 2024-09-28 DIAGNOSIS — F1129 Opioid dependence with unspecified opioid-induced disorder: Secondary | ICD-10-CM | POA: Diagnosis not present

## 2024-09-28 DIAGNOSIS — Z79899 Other long term (current) drug therapy: Secondary | ICD-10-CM

## 2024-09-28 DIAGNOSIS — I10 Essential (primary) hypertension: Secondary | ICD-10-CM

## 2024-09-28 DIAGNOSIS — S93142A Subluxation of metatarsophalangeal joint of left great toe, initial encounter: Secondary | ICD-10-CM

## 2024-09-28 MED ORDER — BUPRENORPHINE HCL-NALOXONE HCL 8-2 MG SL FILM: 1.0000 | ORAL_FILM | Freq: Every day | SUBLINGUAL | 0 refills | Status: DC

## 2024-09-28 MED ORDER — OMEPRAZOLE 20 MG PO CPDR: 20.0000 mg | DELAYED_RELEASE_CAPSULE | Freq: Every day | ORAL | 1 refills | Status: AC

## 2024-09-28 NOTE — Progress Notes (Signed)
 ====================================  [facility]: [phone]   --  Virtual Video Medical Office Visit --  Patient: Denise Rubio      Age: 38 y.o.       Sex:  female  Date:   09/28/2024 Today's Healthcare Provider: Bernardino KANDICE Cone, MD  ====================================    Chief Complaint/Reason For Visit: Medication Refill Suboxone  management.  Chart reviewed: has HTN (hypertension); Anxiety; Chronic hepatitis C without hepatic coma (HCC); Insomnia; Tobacco user; History of drug use disorder; PTSD (post-traumatic stress disorder); Weight disorder; Idiopathic peripheral neuropathy; Chronic bronchitis (HCC); Elevated blood pressure reading; High risk medication use; Opioid dependence with opioid-induced disorder (HCC); FH: breast cancer; Encounter for monitoring Suboxone  maintenance therapy; Chronic bilateral low back pain with bilateral sciatica; Neuropathy; Drug-induced constipation; and Subluxation of metatarsophalangeal joint of great toe, left, initial encounter on their problem list. 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 Discussed the use of AI scribe software for clinical note transcription with the patient, who gave verbal consent to proceed.  History of Present Illness 38 year old female who presents for Suboxone  management.  She has been on Suboxone  for an extended period, finding it life-saving and is committed to adhering to the treatment plan. She is currently taking Suboxone , omeprazole  20 mg for heartburn, gabapentin , hydrochlorothiazide , and lisinopril . Omeprazole  is crucial for managing her heartburn, as even water can cause acid reflux without it.  She  experiences symptoms in her feet, including an uncontrollable burning and itching sensation, swelling, and the feeling of 'marbles' on the bottoms of her feet. Hard knots on the outer sides of her feet make it difficult to apply pressure. These symptoms flare up and subside. She suspects a bunion may be involved.  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 She is cautious about her physical limits due to her back and has support from the client's roommate if needed. She is involved in family activities, including caring for her children and preparing for the arrival of new family members.  She has a history of hypertension and monitors her blood pressure regularly, checking it two to three times a week. She is currently using a reliable vehicle for transportation, which has improved her ability to manage her responsibilities.  Medications reviewed: Current Outpatient Medications on File Prior to Visit  Medication Sig   buPROPion  ER (WELLBUTRIN  SR) 100 MG 12 hr tablet Take 1 tablet (100 mg total) by mouth in the morning.   chlorthalidone  (HYGROTON ) 25 MG tablet Take 1 tablet (25 mg total) by mouth daily. Take in am, replaces hydrochloroTHIAZIDE .   famotidine -calcium carbonate-magnesium hydroxide (PEPCID  COMPLETE) 10-800-165 MG chewable tablet Chew 1 tablet by mouth 2 (two) times daily as needed.   gabapentin  (NEURONTIN ) 300 MG capsule Take 1 capsule (300 mg total) by mouth 4 (four) times daily.   levonorgestrel (MIRENA) 20 MCG/DAY IUD by Intrauterine route.   lisinopril  (ZESTRIL ) 40 MG tablet Take 1 tablet (40 mg total) by mouth daily. History of tubal but can't take if pregnant.   methocarbamol (ROBAXIN) 500 MG tablet Take 500 mg by mouth 4 (four) times daily as needed.   Olopatadine  HCl 0.2 % SOLN Apply 2 drops to eye daily at 6 (six) AM.   ondansetron  (ZOFRAN -ODT)  4 MG disintegrating tablet Take 4 mg by mouth 3 (three) times  daily as needed.   ondansetron  (ZOFRAN -ODT) 4 MG disintegrating tablet Take 1 tablet (4 mg total) by mouth every 8 (eight) hours as needed for nausea or vomiting.   senna (SENOKOT) 8.6 MG tablet Take 1 tablet (8.6 mg total) by mouth daily.   VENTOLIN HFA 108 (90 Base) MCG/ACT inhaler Inhale 2 puffs into the lungs every 4 (four) hours as needed for shortness of breath.   No current facility-administered medications on file prior to visit.   Medications Discontinued During This Encounter  Medication Reason   Buprenorphine  HCl-Naloxone  HCl (SUBOXONE ) 8-2 MG FILM Reorder   hydrochlorothiazide  (HYDRODIURIL ) 25 MG tablet Completed Course   omeprazole  (PRILOSEC) 20 MG capsule Reorder       Virtual Physical Exam: Exam Context: Evaluation limited by virtual format; however, patient is clearly visualized, cooperative, and engaged throughout. General Appearance: Well-developed, well-nourished; no acute distress by limited video assessment. Pulmonary: No respiratory distress apparent; normal work of breathing observed. Neurological: Patient is awake, alert, and demonstrates no obvious focal neurological deficits or cognitive impairments; sensorium appears unclouded. Psychiatric/Mental Status: Mood is appropriate; demeanor is pleasant, calm, and articulate. Speech is coherent and goal-directed with no evidence of slurred or pressured speech. No abnormal psychomotor activity noted. Substance Misuse Indicators: Pupils appear symmetric and reactive as far as can be assessed via video. No track marks, skin lesions, or other stigmata of substance misuse visible. No signs of intoxication or withdrawal are evident.  Office Visit on 11/18/2023  Component Date Value   Buprenorphine  11/18/2023 Comment    Ethyl Glucuronide 11/18/2023 Comment    Amphetamines 11/18/2023 Negative    Barbiturates 11/18/2023 Negative    Benzodiazepines 11/18/2023 Negative    Cocaine Metabolite 11/18/2023 Negative    Phencyclidine  (PCP) 11/18/2023 Negative    Marijuana MTB (THC) 11/18/2023 Comment    6-Acetylmorphine 11/18/2023 Negative    Opiates 11/18/2023 Negative    Oxycodone 11/18/2023 Negative    Tapentadol 11/18/2023 Negative    Fentanyl 11/18/2023 Negative    Methadone  11/18/2023 Negative    Propoxyphene 11/18/2023 Negative    Tramadol  11/18/2023 Negative    Carisoprodol 11/18/2023 Negative    Gabapentin  11/18/2023 Negative    Creatinine 11/18/2023 100    Urine pH 11/18/2023 7.5    Nitrites 11/18/2023 Negative    BUPRENORPHINE  11/18/2023 ++POSITIVE++ (A)    Buprenorphine /CR 11/18/2023 35    Norbuprenorphine/CR 11/18/2023 200    Norbup/Bup Ratio 11/18/2023 5.71    OPIATE ANTAGONIST 11/18/2023 ++POSITIVE++ (A)    Naloxone  11/18/2023 33    CANNABINOIDS 11/18/2023 ++POSITIVE++ (A)    THC/CR Ratio 11/18/2023 92    ETHANOL BIOMARKERS 11/18/2023 ++POSITIVE++ (A)    Ethyl Glucuronide 11/18/2023 >50000    Ethyl Sulfate 11/18/2023 7,878   No image results found. No results found.US  BREAST LTD UNI LEFT INC AXILLA Result Date: 02/19/2023 CLINICAL DATA:  Patient presents to the emergency room for possible left breast infection/abscess. EXAM: ULTRASOUND OF THE LEFT BREAST COMPARISON:  Previous exam(s). FINDINGS: Note that this exam was performed through the emergency department as there was no breast imaging radiologist present for the exam. Targeted ultrasound is performed, showing a superficial complex fluid collection within the thickened skin of the area of concern over the upper central periareolar region. This collection as a small component extending deeper into the breast tissue and may be an evolving abscess. IMPRESSION: Superficial complex fluid collection within the skin of the upper central periareolar region of the  left breast. In the proper clinical setting, this is compatible with superficial infection/mastitis with small deeper component possibly evolving abscess. RECOMMENDATION: Recommend initiating 7-10  day course of antibiotics as well as further complete diagnostic imaging workup with mammogram/ultrasound at the [facility]. I have discussed the findings and recommendations with the patient. If applicable, a reminder letter will be sent to the patient regarding the next appointment. BI-RADS CATEGORY  0: Incomplete. Need additional imaging evaluation and/or prior mammograms for comparison. Electronically Signed   By: Toribio Agreste M.D.   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 She acknowledges its importance in her recovery and is committed to adhering to the treatment plan. Regular medication counts are emphasized to ensure compliance and prevent diversion. Recent Medicare policy changes may require in-person visits if video appointments are canceled. Encourage sending pictures of medication every 10-20 days to demonstrate responsible use, allowing for longer prescription durations. Schedule a video appointment at the end of the month. Send a 30-day Suboxone  prescription. PDMP reviewed during this encounter.  Subluxation of metatarsophalangeal joint of great toe, left, initial encounter She will see podiatry Gastroesophageal reflux disease with esophagitis without hemorrhage GERD is managed with omeprazole , providing significant relief from heartburn. She is dependent on omeprazole  for symptom control, which will be addressed long-term. Send a 90-day supply of omeprazole  to the pharmacy with 1 refills... she is not ready for taper at this time  Primary hypertension Hypertension is managed with lisinopril  and hydrochlorothiazide . Her blood pressure is well-controlled with regular monitoring. Continue lisinopril  and hydrochlorothiazide  as prescribed and maintain regular blood pressure  monitoring.    ORDER ASSOCIATIONS  #   DIAGNOSIS / CONDITION ICD-10 ENCOUNTER ORDER     ICD-10-CM   1. High risk medication use  Z79.899     2. Opioid dependence with opioid-induced disorder (HCC)  F11.29 Buprenorphine  HCl-Naloxone  HCl (SUBOXONE ) 8-2 MG FILM    3. Subluxation of metatarsophalangeal joint of great toe, left, initial encounter  S93.142A     4. Gastroesophageal reflux disease with esophagitis without hemorrhage  K21.00 omeprazole  (PRILOSEC) 20 MG capsule    5. Elevated blood pressure reading  R03.0     6. Primary hypertension  I10            Orders Placed in Encounter:   Meds ordered this encounter  Medications   Buprenorphine  HCl-Naloxone  HCl (SUBOXONE ) 8-2 MG FILM    Sig: Place 1 Film under the tongue daily at 6 (six) AM.    Dispense:  30 each    Refill:  0   omeprazole  (PRILOSEC) 20 MG capsule    Sig: Take 1 capsule (20 mg total) by mouth daily.    Dispense:  90 capsule    Refill:  1       Treatment plan discussed and reviewed in detail. Explained medication safety and potential side effects.  Answered all patient questions and confirmed understanding and comfort with the plan. Encouraged patient to contact our office if they have any questions or concerns.  Agreed on patient coming for a sooner office visit if symptoms worsen, persist, or new symptoms develop. Discussed precautions in case of needing to visit the Emergency Department.    ----------------------------------------------------- Attestation:  Today's Healthcare Provider Bernardino KANDICE Cone, MD was located at office at [facility] at  [place] [address], [place] [address].  The patient was located at home. All video encounter participant identities and locations confirmed visually and verbally.Today's Telemedicine visit was conducted via synchronous Video after consent for telemedicine was obtained:  Video connection was never lost   This document was transcribed and  resynthesized, in part, by artificial intelligence ([employer])  using HIPAA-compliant recording of the clinical interaction;   We have discussed the our use of AI scribe software for clinical note transcription with the patient, who has given verbal consent to proceed.

## 2024-09-28 NOTE — Assessment & Plan Note (Signed)
 Opioid dependence with opioid-induced disorder   Opioid dependence is managed with Suboxone . She acknowledges its importance in her recovery and is committed to adhering to the treatment plan. Regular medication counts are emphasized to ensure compliance and prevent diversion. Recent Medicare policy changes may require in-person visits if video appointments are canceled. Encourage sending pictures of medication every 10-20 days to demonstrate responsible use, allowing for longer prescription durations. Schedule a video appointment at the end of the month. Send a 30-day Suboxone  prescription. PDMP reviewed during this encounter.

## 2024-09-28 NOTE — Assessment & Plan Note (Signed)
She will see podiatry.

## 2024-09-28 NOTE — Assessment & Plan Note (Signed)
 Hypertension is managed with lisinopril  and hydrochlorothiazide . Her blood pressure is well-controlled with regular monitoring. Continue lisinopril  and hydrochlorothiazide  as prescribed and maintain regular blood pressure monitoring.

## 2024-09-28 NOTE — Patient Instructions (Signed)
 It was a pleasure seeing you today! Your health and satisfaction are our top priorities.  Denise Cone, MD  VISIT SUMMARY: Today, you came in for a follow-up on your Suboxone  management. We discussed your current medications and symptoms, including issues with your feet and your recent part-time work. We also reviewed your blood pressure management and heartburn treatment.  YOUR PLAN: -OPIOID DEPENDENCE WITH OPIOID-INDUCED DISORDER: Opioid dependence means you rely on opioids, and Suboxone  helps manage this condition. You are committed to your treatment plan, and we emphasized the importance of regular medication counts to ensure compliance. Due to Medicare policy changes, in-person visits may be required if video appointments are canceled. Please send pictures of your medication every 10-20 days to show responsible use, which can allow for longer prescription durations. We have scheduled a video appointment for the end of the month and sent a 30-day Suboxone  prescription to [facility].  -HYPERTENSION: Hypertension is high blood pressure. Your condition is well-controlled with lisinopril  and hydrochlorothiazide . Continue taking these medications as prescribed and keep monitoring your blood pressure regularly.  -GASTROESOPHAGEAL REFLUX DISEASE (GERD): GERD is a condition where stomach acid frequently flows back into the tube connecting your mouth and stomach, causing heartburn. Omeprazole  is effectively managing your symptoms. We have sent a 90-day supply of omeprazole  to the pharmacy.  INSTRUCTIONS: Please send pictures of your Suboxone  medication every 10-20 days. We have scheduled a video appointment for the end of the month. Continue monitoring your blood pressure regularly and take your medications as prescribed.  Your Providers PCP: Denise Denise MATSU, MD,  [phone]) Referring Provider: [name] MATSU, MD,  [phone])  NEXT STEPS: [x]  Early Intervention: Schedule sooner  appointment, call our on-call services, or go to emergency room if there is any significant Increase in pain or discomfort New or worsening symptoms Sudden or severe changes in your health [x]  Flexible Follow-Up: We recommend a No follow-ups on file. for optimal routine care. This allows for progress monitoring and treatment adjustments. [x]  Preventive Care: Schedule your annual preventive care visit! It's typically covered by insurance and helps identify potential health issues early. [x]  Lab & X-ray Appointments: Incomplete tests scheduled today, or call to schedule. X-rays: [facility] (M-F, 8:30am-noon or 1pm-5pm). [x]  Medical Information Release: Sign a release form at front desk to obtain relevant medical information we don't have.  MAKING THE MOST OF OUR FOCUSED 20 MINUTE APPOINTMENTS: [x]   Clearly state your top concerns at the beginning of the visit to focus our discussion [x]   If you anticipate you will need more time, please inform the front desk during scheduling - we can book multiple appointments in the same week. [x]   If you have transportation problems- use our convenient video appointments or ask about transportation support. [x]   We can get down to business faster if you use MyChart to update information before the visit and submit non-urgent questions before your visit. Thank you for taking the time to provide details through MyChart.  Let our nurse know and she can import this information into your encounter documents.  Arrival and Wait Times: [x]   Arriving on time ensures that everyone receives prompt attention. [x]   Early morning (8a) and afternoon (1p) appointments tend to have shortest wait times. [x]   Unfortunately, we cannot delay appointments for late arrivals or hold slots during phone calls.  Getting Answers and Following Up [x]   Simple Questions & Concerns: For quick questions or basic follow-up after your visit, reach us  at [phone]  [phone] or  MyChart messaging. [x]   Complex Concerns: If your concern is more complex, scheduling an appointment might be best. Discuss this with the staff to find the most suitable option. [x]   Lab & Imaging Results: We'll contact you directly if results are abnormal or you don't use MyChart. Most normal results will be on MyChart within 2-3 business days, with a review message from Dr. Jesus. Haven't heard back in 2 weeks? Need results sooner? Contact us  at [phone]. [x]   Referrals: Our referral coordinator will manage specialist referrals. The specialist's office should contact you within 2 weeks to schedule an appointment. Call us  if you haven't heard from them after 2 weeks.  Staying Connected [x]   MyChart: Activate your MyChart for the fastest way to access results and message us . See the last page of this paperwork for instructions on how to activate.  Bring to Your Next Appointment [x]   Medications: Please bring all your medication bottles to your next appointment to ensure we have an accurate record of your prescriptions. [x]   Health Diaries: If you're monitoring any health conditions at home, keeping a diary of your readings can be very helpful for discussions at your next appointment.  Billing [x]   X-ray & Lab Orders: These are billed by separate companies. Contact the invoicing company directly for questions or concerns. [x]   Visit Charges: Discuss any billing inquiries with our administrative services team.  Your Satisfaction Matters [x]   Share Your Experience: We strive for your satisfaction! If you have any complaints, or preferably compliments, please let Dr. Jesus know directly or contact our Practice Administrators, Manuelita Rubin or [employer], by asking at the front desk.   Reviewing Your Records [x]   Review this early draft of your clinical encounter notes below and the final encounter summary tomorrow on MyChart after its been completed.  All orders placed so far are  visible here: High risk medication use  Opioid dependence with opioid-induced disorder (HCC) -     Buprenorphine  HCl-Naloxone  HCl; Place 1 Film under the tongue daily at 6 (six) AM.  Dispense: 30 each; Refill: 0  Subluxation of metatarsophalangeal joint of great toe, left, initial encounter  Gastroesophageal reflux disease with esophagitis without hemorrhage -     Omeprazole ; Take 1 capsule (20 mg total) by mouth daily.  Dispense: 90 capsule; Refill: 1  Elevated blood pressure reading

## 2024-10-01 ENCOUNTER — Ambulatory Visit: Admitting: Podiatry

## 2024-10-16 ENCOUNTER — Encounter: Payer: Self-pay | Admitting: Internal Medicine

## 2024-10-16 DIAGNOSIS — J4521 Mild intermittent asthma with (acute) exacerbation: Secondary | ICD-10-CM

## 2024-10-27 MED ORDER — VENTOLIN HFA 108 (90 BASE) MCG/ACT IN AERS: 2.0000 | INHALATION_SPRAY | RESPIRATORY_TRACT | 11 refills | Status: DC | PRN

## 2024-10-30 ENCOUNTER — Telehealth: Admitting: Internal Medicine

## 2024-10-30 ENCOUNTER — Encounter: Payer: Self-pay | Admitting: Internal Medicine

## 2024-10-30 DIAGNOSIS — F1129 Opioid dependence with unspecified opioid-induced disorder: Secondary | ICD-10-CM

## 2024-10-30 DIAGNOSIS — F419 Anxiety disorder, unspecified: Secondary | ICD-10-CM

## 2024-10-30 DIAGNOSIS — J410 Simple chronic bronchitis: Secondary | ICD-10-CM | POA: Diagnosis not present

## 2024-10-30 DIAGNOSIS — F424 Excoriation (skin-picking) disorder: Secondary | ICD-10-CM

## 2024-10-30 DIAGNOSIS — Z720 Tobacco use: Secondary | ICD-10-CM

## 2024-10-30 DIAGNOSIS — Z79899 Other long term (current) drug therapy: Secondary | ICD-10-CM | POA: Diagnosis not present

## 2024-10-30 DIAGNOSIS — J4521 Mild intermittent asthma with (acute) exacerbation: Secondary | ICD-10-CM

## 2024-10-30 MED ORDER — NICOTINE POLACRILEX 4 MG MT GUM: CHEWING_GUM | OROMUCOSAL | 0 refills | Status: AC

## 2024-10-30 MED ORDER — NICOTINE 14 MG/24HR TD PT24: MEDICATED_PATCH | TRANSDERMAL | 0 refills | Status: AC

## 2024-10-30 MED ORDER — BUPROPION HCL ER (SR) 150 MG PO TB12: 150.0000 mg | ORAL_TABLET | Freq: Two times a day (BID) | ORAL | 4 refills | Status: AC

## 2024-10-30 MED ORDER — NICOTINE POLACRILEX 4 MG MT LOZG: LOZENGE | OROMUCOSAL | 0 refills | Status: AC

## 2024-10-30 MED ORDER — VENTOLIN HFA 108 (90 BASE) MCG/ACT IN AERS: 2.0000 | INHALATION_SPRAY | RESPIRATORY_TRACT | 11 refills | Status: AC | PRN

## 2024-10-30 MED ORDER — NICOTINE 21 MG/24HR TD PT24: MEDICATED_PATCH | TRANSDERMAL | 0 refills | Status: AC

## 2024-10-30 MED ORDER — NICOTINE 7 MG/24HR TD PT24: MEDICATED_PATCH | TRANSDERMAL | 0 refills | Status: AC

## 2024-10-30 MED ORDER — BUPRENORPHINE HCL-NALOXONE HCL 8-2 MG SL FILM: 1.0000 | ORAL_FILM | Freq: Every day | SUBLINGUAL | 0 refills | Status: DC

## 2024-10-30 NOTE — Progress Notes (Signed)
 ====================================  [facility]: [phone]   --  Virtual Video Medical Office Visit --  Patient: Denise Rubio      Age: 38 y.o.       Sex:  female  Date:   10/30/2024 Today's Healthcare Provider: Bernardino KANDICE Cone, MD  ====================================    Chief Complaint/Reason For Visit: Medication Refill  Chart reviewed: has Hypertension; Anxiety; Chronic hepatitis C without hepatic coma (HCC); Insomnia; Tobacco user; History of drug use disorder; PTSD (post-traumatic stress disorder); Weight disorder; Idiopathic peripheral neuropathy; Chronic bronchitis (HCC); Elevated blood pressure reading; High risk medication use; Opioid dependence with opioid-induced disorder (HCC); FH: breast cancer; Encounter for monitoring Suboxone  maintenance therapy; Chronic bilateral low back pain with bilateral sciatica; Neuropathy; Drug-induced constipation; and Subluxation of metatarsophalangeal joint of great toe, left, initial encounter on their problem list. 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 Discussed the use of AI scribe software for clinical note transcription with the patient, who gave verbal consent to proceed.  History of Present Illness 38 year old female with chronic bronchitis secondary to smoking who presents with difficulty breathing.  She experiences difficulty breathing and uses her inhaler more frequently, particularly during this time of year. Despite the increased usage, she finds the inhaler helpful.  She is actively trying to quit smoking, recognizing its detrimental effects on her health, especially her respiratory function. She has attempted to  quit in the past but finds smoking cessation challenging. Her smoking varies with her activities, smoking more when bored and less when occupied, such as when caring for her grandchildren. She has tried vaping but found it more harmful to her lungs than cigarettes. Currently, she smokes about half a pack to a pack a day.  She has a history of anxiety, which manifests as compulsive skin picking, particularly when anxious or stressed. This behavior has been present since childhood and is exacerbated by stress. She reports picking at a spot along her hairline. No use of CBD, THC, or Kratom products is reported, and she only uses prescribed medications.  She is currently taking Wellbutrin , although not consistently, and has a history of using it during detox for substance use. She has previously tried other medications like Prozac but did not like their effects. She is interested in natural methods and therapy for managing her anxiety and picking behavior.  She has a history of substance use, having been prescribed pain medication post-surgery, which led to street drug use after the prescription ended. She is currently on Suboxone  and reports compliance with the treatment.   Medications reviewed: Current Outpatient Medications on File Prior to Visit  Medication Sig   chlorthalidone  (HYGROTON ) 25 MG tablet Take 1 tablet (25 mg total) by mouth daily. Take in am, replaces hydrochloroTHIAZIDE .   famotidine -calcium carbonate-magnesium hydroxide (PEPCID  COMPLETE) 10-800-165 MG chewable tablet Chew 1 tablet by mouth 2 (two) times daily as needed.   gabapentin  (NEURONTIN ) 300 MG capsule Take 1 capsule (300 mg total) by mouth 4 (four) times daily.   levonorgestrel (MIRENA) 20 MCG/DAY IUD by Intrauterine route.   lisinopril  (ZESTRIL ) 40 MG tablet Take 1 tablet (40 mg total) by mouth daily. History of tubal but can't take if pregnant.   methocarbamol (ROBAXIN) 500 MG tablet Take 500 mg by mouth 4 (four) times  daily as needed.   Olopatadine  HCl 0.2 % SOLN Apply 2 drops to eye daily at 6 (six) AM.   omeprazole  (PRILOSEC) 20 MG capsule Take 1 capsule (20 mg total) by mouth daily.   ondansetron  (ZOFRAN -ODT) 4 MG disintegrating tablet Take 4 mg by mouth 3 (three) times daily as needed.   ondansetron  (ZOFRAN -ODT) 4 MG disintegrating tablet Take 1 tablet (4 mg total) by mouth every 8 (eight) hours as needed for nausea or vomiting.   senna (SENOKOT) 8.6 MG tablet Take 1 tablet (8.6 mg total) by mouth daily.   No current facility-administered medications on file prior to visit.   Medications Discontinued During This Encounter  Medication Reason   buPROPion  ER (WELLBUTRIN  SR) 100 MG 12 hr tablet    Buprenorphine  HCl-Naloxone  HCl (SUBOXONE ) 8-2 MG FILM Reorder   VENTOLIN HFA 108 (90 Base) MCG/ACT inhaler Reorder       Virtual Physical Exam: Exam Context: Evaluation limited by virtual format; however, patient is clearly visualized, cooperative, and engaged throughout. General Appearance: Well-developed, well-nourished; no acute distress by limited video assessment. Pulmonary: No respiratory distress apparent; normal work of breathing observed. Neurological: Patient is awake, alert, and demonstrates no obvious focal neurological deficits or cognitive impairments; sensorium appears unclouded. Psychiatric/Mental Status: Mood is appropriate; demeanor is pleasant, calm, and articulate. Speech is coherent and goal-directed with no evidence of slurred or pressured speech. No abnormal psychomotor activity noted. Substance Misuse Indicators: Pupils appear symmetric and reactive as far as can be assessed via video. No track marks, skin lesions, or other stigmata of substance misuse visible. No signs of intoxication or withdrawal are evident.  Office Visit on 11/18/2023  Component Date Value   Buprenorphine  11/18/2023 Comment    Ethyl Glucuronide 11/18/2023 Comment    Amphetamines 11/18/2023 Negative     Barbiturates 11/18/2023 Negative    Benzodiazepines 11/18/2023 Negative    Cocaine Metabolite 11/18/2023 Negative    Phencyclidine (PCP) 11/18/2023 Negative    Marijuana MTB (THC) 11/18/2023 Comment    6-Acetylmorphine 11/18/2023 Negative    Opiates 11/18/2023 Negative    Oxycodone 11/18/2023 Negative    Tapentadol 11/18/2023 Negative    Fentanyl 11/18/2023 Negative    Methadone  11/18/2023 Negative    Propoxyphene 11/18/2023 Negative    Tramadol  11/18/2023 Negative    Carisoprodol 11/18/2023 Negative    Gabapentin  11/18/2023 Negative    Creatinine 11/18/2023 100    Urine pH 11/18/2023 7.5    Nitrites 11/18/2023 Negative    BUPRENORPHINE  11/18/2023 ++POSITIVE++ (A)    Buprenorphine /CR 11/18/2023 35    Norbuprenorphine/CR 11/18/2023 200    Norbup/Bup Ratio 11/18/2023 5.71    OPIATE ANTAGONIST 11/18/2023 ++POSITIVE++ (A)    Naloxone  11/18/2023 33    CANNABINOIDS 11/18/2023 ++POSITIVE++ (A)    THC/CR Ratio 11/18/2023 92    ETHANOL BIOMARKERS 11/18/2023 ++POSITIVE++ (A)    Ethyl Glucuronide 11/18/2023 >50000    Ethyl Sulfate 11/18/2023 7,878   No image results found. No results found.US  BREAST LTD UNI LEFT INC AXILLA Result Date: 02/19/2023 CLINICAL DATA:  Patient presents to the emergency room for possible left breast infection/abscess. EXAM: ULTRASOUND OF THE LEFT BREAST COMPARISON:  Previous exam(s). FINDINGS: Note that this exam was performed through the emergency department as there was no breast imaging radiologist present for the exam. Targeted ultrasound is performed, showing a superficial complex fluid collection within the thickened skin of the area of concern over the upper central periareolar region. This collection as a small component extending deeper into the breast tissue and may be  an evolving abscess. IMPRESSION: Superficial complex fluid collection within the skin of the upper central periareolar region of the left breast. In the proper clinical setting, this is  compatible with superficial infection/mastitis with small deeper component possibly evolving abscess. RECOMMENDATION: Recommend initiating 7-10 day course of antibiotics as well as further complete diagnostic imaging workup with mammogram/ultrasound at the [facility]. I have discussed the findings and recommendations with the patient. If applicable, a reminder letter will be sent to the patient regarding the next appointment. BI-RADS CATEGORY  0: Incomplete. Need additional imaging evaluation and/or prior mammograms for comparison. Electronically Signed   By: Toribio Agreste M.D.   On: 02/19/2023 19:34       ASSESSMENT & PLAN   Assessment & Plan Opioid dependence with opioid-induced disorder (HCC) High risk medication use Last urine drug screen 10/2023 needs refreshed soon but appears to be doing great in recovery. Satisfied with current suboxone  management of cravings - refilled medications Continue Suboxone  as prescribed. Continue(s) with random pill uploads PDMP reviewed during this encounter. Simple chronic bronchitis (HCC) Tobacco user Mild intermittent extrinsic asthma with acute exacerbation Chronic bronchitis and asthma exacerbation secondary to tobacco use   Exacerbation of chronic bronchitis and asthma symptoms likely due to chronic smoking. She reports increased inhaler use and difficulty breathing, especially during this time of year. Continue Ventolin HFA inhaler as needed for symptom relief.  Nicotine  dependence   Chronic nicotine  dependence with current smoking of half a pack to a pack per day. She expresses a desire to quit smoking and acknowledges its impact on her health. Discussed nicotine  replacement therapies and Wellbutrin  for smoking cessation. Previously found Nicorette gum effective. Increased Wellbutrin  ER to 150 mg daily for smoking cessation and weight management. Prescribed Nicorette gum, nicotine  patches, and lozenges. Advised to call 1-800-QUIT-NOW for  free nicotine  replacement therapies. Encouraged to set quit dates and use nicotine  replacement therapies consistently. Anxiety Anxiety disorder   Chronic anxiety with a history of self-soothing behaviors such as skin-picking. Discussed potential benefits of therapy and habit reversal training. Referred to a therapist for evaluation and management of anxiety. Skin picking habit Excoriation (skin-picking) disorder   Chronic skin-picking behavior, particularly along the hairline, possibly related to anxiety. Prefers natural methods and is interested in therapy. Apply antibiotic ointment to affected areas to prevent infection. Referred to a therapist for habit reversal training and further evaluation.   ORDER ASSOCIATIONS  #   DIAGNOSIS / CONDITION ICD-10 ENCOUNTER ORDER     ICD-10-CM   1. Simple chronic bronchitis (HCC)  J41.0     2. Tobacco user  Z72.0 buPROPion  (WELLBUTRIN  SR) 150 MG 12 hr tablet    nicotine  (NICODERM CQ  - DOSED IN MG/24 HOURS) 21 mg/24hr patch    nicotine  (NICODERM CQ  - DOSED IN MG/24 HOURS) 14 mg/24hr patch    nicotine  (NICODERM CQ  - DOSED IN MG/24 HR) 7 mg/24hr patch    nicotine  polacrilex (NICORETTE) 4 MG gum    nicotine  polacrilex (NICORETTE) 4 MG gum    nicotine  polacrilex (NICORETTE) 4 MG gum    nicotine  polacrilex (COMMIT) 4 MG lozenge    nicotine  polacrilex (COMMIT) 4 MG lozenge    nicotine  polacrilex (COMMIT) 4 MG lozenge    3. Opioid dependence with opioid-induced disorder (HCC)  F11.29 Buprenorphine  HCl-Naloxone  HCl (SUBOXONE ) 8-2 MG FILM    4. High risk medication use  Z79.899     5. Anxiety  F41.9     6. Skin picking habit  F42.4 Ambulatory referral  to Psychology    7. Mild intermittent extrinsic asthma with acute exacerbation  J45.21 VENTOLIN HFA 108 (90 Base) MCG/ACT inhaler           Orders Placed in Encounter:   Lab Orders  No laboratory test(s) ordered today   Imaging Orders  No imaging studies ordered today   Referral Orders          Ambulatory referral to Psychology     Meds ordered this encounter  Medications   buPROPion  (WELLBUTRIN  SR) 150 MG 12 hr tablet    Sig: Take 1 tablet (150 mg total) by mouth 2 (two) times daily.    Dispense:  90 tablet    Refill:  4   VENTOLIN HFA 108 (90 Base) MCG/ACT inhaler    Sig: Inhale 2 puffs into the lungs every 4 (four) hours as needed for shortness of breath.    Dispense:  3 each    Refill:  11   Buprenorphine  HCl-Naloxone  HCl (SUBOXONE ) 8-2 MG FILM    Sig: Place 1 Film under the tongue daily at 6 (six) AM.    Dispense:  30 each    Refill:  0   nicotine  (NICODERM CQ  - DOSED IN MG/24 HOURS) 21 mg/24hr patch    Sig: RX #1 Weeks 1-4: 21 mg x 1 patch daily. Wear for 24 hours. If you have sleep disturbances, remove at bedtime.    Dispense:  28 patch    Refill:  0   nicotine  (NICODERM CQ  - DOSED IN MG/24 HOURS) 14 mg/24hr patch    Sig: RX #2 Weeks 5-6: 14 mg x 1 patch daily. Wear for 24 hours. If you have sleep disturbances, remove at bedtime.    Dispense:  14 patch    Refill:  0   nicotine  (NICODERM CQ  - DOSED IN MG/24 HR) 7 mg/24hr patch    Sig: RX #3 Weeks 7-8: 7 mg x 1 patch daily. Wear for 24 hours. If you have sleep disturbances, remove at bedtime.    Dispense:  14 patch    Refill:  0   nicotine  polacrilex (NICORETTE) 4 MG gum    Sig: RX #1 Weeks 1-6: 1 piece every 1-2 hours.Use at least 9 pieces of gum per day for the first 6 weeks. Max 24 pieces per day.    Dispense:  1008 each    Refill:  0   nicotine  polacrilex (NICORETTE) 4 MG gum    Sig: RX #2 Weeks 7-9: 1 piece every 2-4 hours.Max 24 pieces per day..    Dispense:  252 each    Refill:  0   nicotine  polacrilex (NICORETTE) 4 MG gum    Sig: RX #3 Weeks 10-12: 1 piece every 4-8 hours.Max 24 pieces per day.    Dispense:  126 each    Refill:  0   nicotine  polacrilex (COMMIT) 4 MG lozenge    Sig: RX #1 Weeks 1-6: 1 lozenge every 1-2 hours. Use at least 9 lozenges per day for the first 6 weeks. Max 20 lozenges per day.     Dispense:  1008 lozenge    Refill:  0   nicotine  polacrilex (COMMIT) 4 MG lozenge    Sig: RX #2 Weeks 7-9: 1 lozenge every 2-4 hours.Max 20 lozenges per day.    Dispense:  252 lozenge    Refill:  0   nicotine  polacrilex (COMMIT) 4 MG lozenge    Sig: RX #3 Weeks 10-12: 1 lozenge every 4-8 hours.Max 20 lozenges per day.  Dispense:  126 lozenge    Refill:  0    Orders Placed This Encounter  Procedures   Ambulatory referral to Psychology    Referral Priority:   Routine    Referral Type:   Psychiatric    Referral Reason:   Specialty Services Required    Requested Specialty:   Psychology    Number of Visits Requested:   1          Treatment plan discussed and reviewed in detail. Explained medication safety and potential side effects.  Answered all patient questions and confirmed understanding and comfort with the plan. Encouraged patient to contact our office if they have any questions or concerns.  Agreed on patient coming for a sooner office visit if symptoms worsen, persist, or new symptoms develop. Discussed precautions in case of needing to visit the Emergency Department.    ----------------------------------------------------- Attestation:  Today's Healthcare Provider Bernardino KANDICE Cone, MD was located at office at [facility] at [facility] [address], [place] [address].  The patient was located at her mom's home, she is carign for a gorgeous baby. All video encounter participant identities and locations confirmed visually and verbally.Today's Telemedicine visit was conducted via synchronous Video after consent for telemedicine was obtained:  Video connection was never lost   This document was transcribed and resynthesized, in part, by artificial intelligence ([employer])  using HIPAA-compliant recording of the clinical interaction;   We have discussed the our use of AI scribe software for clinical note transcription with the patient, who has given verbal  consent to proceed.

## 2024-10-31 ENCOUNTER — Encounter: Payer: Self-pay | Admitting: Internal Medicine

## 2024-10-31 NOTE — Assessment & Plan Note (Signed)
 Anxiety disorder   Chronic anxiety with a history of self-soothing behaviors such as skin-picking. Discussed potential benefits of therapy and habit reversal training. Referred to a therapist for evaluation and management of anxiety.

## 2024-10-31 NOTE — Assessment & Plan Note (Signed)
 Last urine drug screen 10/2023 needs refreshed soon but appears to be doing great in recovery. Satisfied with current suboxone  management of cravings - refilled medications Continue Suboxone  as prescribed. Continue(s) with random pill uploads PDMP reviewed during this encounter.

## 2024-10-31 NOTE — Patient Instructions (Signed)
 It was a pleasure seeing you today! Your health and satisfaction are our top priorities.  Denise Cone, MD  VISIT SUMMARY: During your visit, we discussed your chronic bronchitis and asthma, nicotine  dependence, skin-picking disorder, anxiety, and opioid dependence. We reviewed your current symptoms, medications, and strategies to manage your conditions.  YOUR PLAN: -CHRONIC BRONCHITIS AND ASTHMA EXACERBATION: Chronic bronchitis is a long-term inflammation of the airways in the lungs, often caused by smoking, which can lead to difficulty breathing. Continue using your Ventolin HFA inhaler as needed for symptom relief.  -NICOTINE  DEPENDENCE: Nicotine  dependence is an addiction to tobacco products. We discussed various nicotine  replacement therapies and increased your Wellbutrin  ER to 150 mg daily to help with smoking cessation and weight management. You were also prescribed Nicorette gum, nicotine  patches, and lozenges. Please call 1-800-QUIT-NOW for free nicotine  replacement therapies and set quit dates to use these therapies consistently.  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 Apply antibiotic ointment to the affected areas to prevent infection. You have been referred to a therapist for habit reversal training and further evaluation.  -ANXIETY DISORDER: Anxiety disorder involves excessive worry and can lead to behaviors like skin-picking. We discussed the potential benefits of therapy and habit reversal training. You have been referred to a therapist for further evaluation and management of your anxiety.  -OPIOID DEPENDENCE ON SUBOXONE : Opioid dependence is an addiction to opioid drugs. Continue taking Suboxone  as prescribed to manage this condition.  INSTRUCTIONS: Please follow up with the therapist for your skin-picking and anxiety disorders. Continue using your Ventolin HFA inhaler as needed and take Wellbutrin  ER 150  mg daily. Use Nicorette gum, nicotine  patches, and lozenges as prescribed, and call 1-800-QUIT-NOW for additional support with smoking cessation. Set quit dates and use nicotine  replacement therapies consistently.  Your Providers PCP: Denise Denise MATSU, MD,  [phone]) Referring Provider: [name] MATSU, MD,  [phone])  NEXT STEPS: [x]  Early Intervention: Schedule sooner appointment, call our on-call services, or go to emergency room if there is any significant Increase in pain or discomfort New or worsening symptoms Sudden or severe changes in your health [x]  Flexible Follow-Up: We recommend a No follow-ups on file. for optimal routine care. This allows for progress monitoring and treatment adjustments. [x]  Preventive Care: Schedule your annual preventive care visit! It's typically covered by insurance and helps identify potential health issues early. [x]  Lab & X-ray Appointments: Incomplete tests scheduled today, or call to schedule. X-rays: [facility] (M-F, 8:30am-noon or 1pm-5pm). [x]  Medical Information Release: Sign a release form at front desk to obtain relevant medical information we don't have.  MAKING THE MOST OF OUR FOCUSED 20 MINUTE APPOINTMENTS: [x]   Clearly state your top concerns at the beginning of the visit to focus our discussion [x]   If you anticipate you will need more time, please inform the front desk during scheduling - we can book multiple appointments in the same week. [x]   If you have transportation problems- use our convenient video appointments or ask about transportation support. [x]   We can get down to business faster if you use MyChart to update information before the visit and submit non-urgent questions before your visit. Thank you for taking the time to provide details through MyChart.  Let our nurse know and she can import this information into your encounter documents.  Arrival and Wait Times: [x]   Arriving on time ensures that  everyone receives prompt attention. [x]   Early morning (8a) and afternoon (  1p) appointments tend to have shortest wait times. [x]   Unfortunately, we cannot delay appointments for late arrivals or hold slots during phone calls.  Getting Answers and Following Up [x]   Simple Questions & Concerns: For quick questions or basic follow-up after your visit, reach us  at [phone] or MyChart messaging. [x]   Complex Concerns: If your concern is more complex, scheduling an appointment might be best. Discuss this with the staff to find the most suitable option. [x]   Lab & Imaging Results: We'll contact you directly if results are abnormal or you don't use MyChart. Most normal results will be on MyChart within 2-3 business days, with a review message from Dr. Jesus. Haven't heard back in 2 weeks? Need results sooner? Contact us  at [phone]. [x]   Referrals: Our referral coordinator will manage specialist referrals. The specialist's office should contact you within 2 weeks to schedule an appointment. Call us  if you haven't heard from them after 2 weeks.  Staying Connected [x]   MyChart: Activate your MyChart for the fastest way to access results and message us . See the last page of this paperwork for instructions on how to activate.  Bring to Your Next Appointment [x]   Medications: Please bring all your medication bottles to your next appointment to ensure we have an accurate record of your prescriptions. [x]   Health Diaries: If you're monitoring any health conditions at home, keeping a diary of your readings can be very helpful for discussions at your next appointment.  Billing [x]   X-ray & Lab Orders: These are billed by separate companies. Contact the invoicing company directly for questions or concerns. [x]   Visit Charges: Discuss any billing inquiries with our administrative services team.  Your Satisfaction Matters [x]   Share Your Experience: We strive for your satisfaction! If you have any  complaints, or preferably compliments, please let Dr. Jesus know directly or contact our Practice Administrators, Manuelita Rubin or [employer], by asking at the front desk.   Reviewing Your Records [x]   Review this early draft of your clinical encounter notes below and the final encounter summary tomorrow on MyChart after its been completed.  All orders placed so far are visible here: Opioid dependence with opioid-induced disorder (HCC) -     Buprenorphine  HCl-Naloxone  HCl; Place 1 Film under the tongue daily at 6 (six) AM.  Dispense: 30 each; Refill: 0  Simple chronic bronchitis (HCC)  Tobacco user -     buPROPion  HCl ER (SR); Take 1 tablet (150 mg total) by mouth 2 (two) times daily.  Dispense: 90 tablet; Refill: 4 -     Nicotine ; RX #1 Weeks 1-4: 21 mg x 1 patch daily. Wear for 24 hours. If you have sleep disturbances, remove at bedtime.  Dispense: 28 patch; Refill: 0 -     Nicotine ; RX #2 Weeks 5-6: 14 mg x 1 patch daily. Wear for 24 hours. If you have sleep disturbances, remove at bedtime.  Dispense: 14 patch; Refill: 0 -     Nicotine ; RX #3 Weeks 7-8: 7 mg x 1 patch daily. Wear for 24 hours. If you have sleep disturbances, remove at bedtime.  Dispense: 14 patch; Refill: 0 -     Nicotine  Polacrilex; RX #1 Weeks 1-6: 1 piece every 1-2 hours.Use at least 9 pieces of gum per day for the first 6 weeks. Max 24 pieces per day.  Dispense: 1008 each; Refill: 0 -     Nicotine  Polacrilex; RX #2 Weeks 7-9: 1 piece every 2-4 hours.Max 24 pieces per  day..  Dispense: 252 each; Refill: 0 -     Nicotine  Polacrilex; RX #3 Weeks 10-12: 1 piece every 4-8 hours.Max 24 pieces per day.  Dispense: 126 each; Refill: 0 -     Nicotine  Polacrilex; RX #1 Weeks 1-6: 1 lozenge every 1-2 hours. Use at least 9 lozenges per day for the first 6 weeks. Max 20 lozenges per day.  Dispense: 1008 lozenge; Refill: 0 -     Nicotine  Polacrilex; RX #2 Weeks 7-9: 1 lozenge every 2-4 hours.Max 20 lozenges per day.  Dispense: 252  lozenge; Refill: 0 -     Nicotine  Polacrilex; RX #3 Weeks 10-12: 1 lozenge every 4-8 hours.Max 20 lozenges per day.  Dispense: 126 lozenge; Refill: 0  High risk medication use  Anxiety  Skin picking habit -     Ambulatory referral to Psychology  Mild intermittent extrinsic asthma with acute exacerbation -     Ventolin HFA; Inhale 2 puffs into the lungs every 4 (four) hours as needed for shortness of breath.  Dispense: 3 each; Refill: 11

## 2024-10-31 NOTE — Assessment & Plan Note (Signed)
 Chronic bronchitis and asthma exacerbation secondary to tobacco use   Exacerbation of chronic bronchitis and asthma symptoms likely due to chronic smoking. She reports increased inhaler use and difficulty breathing, especially during this time of year. Continue Ventolin HFA inhaler as needed for symptom relief.  Nicotine  dependence   Chronic nicotine  dependence with current smoking of half a pack to a pack per day. She expresses a desire to quit smoking and acknowledges its impact on her health. Discussed nicotine  replacement therapies and Wellbutrin  for smoking cessation. Previously found Nicorette gum effective. Increased Wellbutrin  ER to 150 mg daily for smoking cessation and weight management. Prescribed Nicorette gum, nicotine  patches, and lozenges. Advised to call 1-800-QUIT-NOW for free nicotine  replacement therapies. Encouraged to set quit dates and use nicotine  replacement therapies consistently.

## 2024-11-16 MED ORDER — IPRATROPIUM-ALBUTEROL 0.5-2.5 (3) MG/3ML IN SOLN: 3.0000 mL | RESPIRATORY_TRACT | 4 refills | Status: AC | PRN

## 2024-11-16 NOTE — Addendum Note (Signed)
 Addended by: Stephen Baruch G on: 11/16/2024 10:51 AM   Modules accepted: Orders

## 2024-12-01 ENCOUNTER — Telehealth: Admitting: Internal Medicine

## 2024-12-01 ENCOUNTER — Encounter: Payer: Self-pay | Admitting: Internal Medicine

## 2024-12-01 DIAGNOSIS — K219 Gastro-esophageal reflux disease without esophagitis: Secondary | ICD-10-CM

## 2024-12-01 DIAGNOSIS — F11288 Opioid dependence with other opioid-induced disorder: Secondary | ICD-10-CM

## 2024-12-01 DIAGNOSIS — Z720 Tobacco use: Secondary | ICD-10-CM

## 2024-12-01 DIAGNOSIS — F411 Generalized anxiety disorder: Secondary | ICD-10-CM | POA: Insufficient documentation

## 2024-12-01 DIAGNOSIS — F1129 Opioid dependence with unspecified opioid-induced disorder: Secondary | ICD-10-CM

## 2024-12-01 DIAGNOSIS — Z79899 Other long term (current) drug therapy: Secondary | ICD-10-CM

## 2024-12-01 MED ORDER — FAMOTIDINE 20 MG PO TABS: 20.0000 mg | ORAL_TABLET | Freq: Two times a day (BID) | ORAL | 4 refills | Status: AC

## 2024-12-01 MED ORDER — ESCITALOPRAM OXALATE 10 MG PO TABS: 10.0000 mg | ORAL_TABLET | Freq: Every day | ORAL | 1 refills | Status: DC

## 2024-12-01 MED ORDER — BUPRENORPHINE HCL-NALOXONE HCL 8-2 MG SL FILM: 1.0000 | ORAL_FILM | Freq: Every day | SUBLINGUAL | 0 refills | Status: DC

## 2024-12-01 NOTE — Patient Instructions (Signed)
 It was a pleasure seeing you today! Your health and satisfaction are our top priorities.  Bernardino Cone, MD  VISIT SUMMARY: During your visit, we discussed your ongoing recovery from substance use disorder, management of anxiety, efforts to quit smoking, and treatment for gastroesophageal reflux disease (GERD). We reviewed your current medications and made some adjustments to better address your symptoms and support your health goals.  YOUR PLAN: -OPIOID DEPENDENCE WITH OPIOID-INDUCED DISORDER: Opioid dependence is a condition where the body relies on opioids, often leading to withdrawal symptoms without them. Your dependence is well-managed with Suboxone , and you are not experiencing significant cravings. We will continue your current Suboxone  dose and schedule a contract renewal and urine drug screen in one month.  -GENERALIZED ANXIETY DISORDER: Generalized anxiety disorder is characterized by excessive worry and physical symptoms like heart palpitations and facial flushing. Since gabapentin  may not be effective for you, we are prescribing Lexapro  10 mg, starting with 5 mg for the first week or two. We will follow up in one month to see how you are responding to Lexapro .  -TOBACCO USE DISORDER: Tobacco use disorder is a dependence on nicotine  found in cigarettes. You are making progress in reducing smoking and are using nicotine  patches, gums, and Wellbutrin . We encourage you to quit smoking entirely by January 9th and to use nicotine  replacement therapies like patches, gums, lozenges, or Zyn pouches to help you quit.  -GASTROESOPHAGEAL REFLUX DISEASE: Gastroesophageal reflux disease (GERD) is a condition where stomach acid frequently flows back into the tube connecting your mouth and stomach. You are currently using Prilosec, but long-term use can have side effects. We recommend switching to Pepcid  Complete and gradually tapering off Prilosec to reduce these risks.  INSTRUCTIONS: Please continue  your current Suboxone  dose and schedule a contract renewal and urine drug screen in one month. Start taking Lexapro  10 mg, beginning with 5 mg for the first week or two, and follow up in one month to assess your response. Aim to quit smoking entirely by January 9th, using nicotine  replacement therapies as needed. Transition from Prilosec to Pepcid  Complete gradually to manage your GERD symptoms.  Your Providers PCP: Cone Bernardino MATSU, MD,  [phone]) Referring Provider: [facility] Bernardino MATSU, MD,  [phone])  NEXT STEPS: [x]  Early Intervention: Schedule sooner appointment, call our on-call services, or go to emergency room if there is any significant Increase in pain or discomfort New or worsening symptoms Sudden or severe changes in your health [x]  Flexible Follow-Up: We recommend a Return in about 1 month (around 01/01/2025). for optimal routine care. This allows for progress monitoring and treatment adjustments. [x]  Preventive Care: Schedule your annual preventive care visit! It's typically covered by insurance and helps identify potential health issues early. [x]  Lab & X-ray Appointments: Incomplete tests scheduled today, or call to schedule. X-rays: [facility] (M-F, 8:30am-noon or 1pm-5pm). [x]  Medical Information Release: Sign a release form at front desk to obtain relevant medical information we don't have.  MAKING THE MOST OF OUR FOCUSED 20 MINUTE APPOINTMENTS: [x]   Clearly state your top concerns at the beginning of the visit to focus our discussion [x]   If you anticipate you will need more time, please inform the front desk during scheduling - we can book multiple appointments in the same week. [x]   If you have transportation problems- use our convenient video appointments or ask about transportation support. [x]   We can get down to business faster if you use MyChart to update information before the  visit and submit non-urgent questions before your visit. Thank you for  taking the time to provide details through MyChart.  Let our nurse know and she can import this information into your encounter documents.  Arrival and Wait Times: [x]   Arriving on time ensures that everyone receives prompt attention. [x]   Early morning (8a) and afternoon (1p) appointments tend to have shortest wait times. [x]   Unfortunately, we cannot delay appointments for late arrivals or hold slots during phone calls.  Getting Answers and Following Up [x]   Simple Questions & Concerns: For quick questions or basic follow-up after your visit, reach us  at [phone] or MyChart messaging. [x]   Complex Concerns: If your concern is more complex, scheduling an appointment might be best. Discuss this with the staff to find the most suitable option. [x]   Lab & Imaging Results: We'll contact you directly if results are abnormal or you don't use MyChart. Most normal results will be on MyChart within 2-3 business days, with a review message from Dr. Jesus. Haven't heard back in 2 weeks? Need results sooner? Contact us  at [phone]. [x]   Referrals: Our referral coordinator will manage specialist referrals. The specialist's office should contact you within 2 weeks to schedule an appointment. Call us  if you haven't heard from them after 2 weeks.  Staying Connected [x]   MyChart: Activate your MyChart for the fastest way to access results and message us . See the last page of this paperwork for instructions on how to activate.  Bring to Your Next Appointment [x]   Medications: Please bring all your medication bottles to your next appointment to ensure we have an accurate record of your prescriptions. [x]   Health Diaries: If you're monitoring any health conditions at home, keeping a diary of your readings can be very helpful for discussions at your next appointment.  Billing [x]   X-ray & Lab Orders: These are billed by separate companies. Contact the invoicing company directly for questions or  concerns. [x]   Visit Charges: Discuss any billing inquiries with our administrative services team.  Your Satisfaction Matters [x]   Share Your Experience: We strive for your satisfaction! If you have any complaints, or preferably compliments, please let Dr. Jesus know directly or contact our Practice Administrators, Manuelita Rubin or [employer], by asking at the front desk.   Reviewing Your Records [x]   Review this early draft of your clinical encounter notes below and the final encounter summary tomorrow on MyChart after its been completed.  All orders placed so far are visible here: Opioid dependence with other opioid-induced disorder (HCC)  High risk medication use Assessment & Plan: Last urine drug screen 10/2023 needs refreshed soon but appears to be doing great in recovery by limited Virtual Office Visit (Video Appointment)  Encouraged patient to to make next month appointment in person so we can refresh urine drug screen/contract; its a long drive/ Satisfied with current suboxone  management of cravings - refilled medications Continue Suboxone  as prescribed. Continue(s) with random pill uploads PDMP reviewed during this encounter.   Opioid dependence with opioid-induced disorder [facility]) Assessment & Plan: Last urine drug screen 10/2023 needs refreshed soon but appears to be doing great in recovery by limited Virtual Office Visit (Video Appointment)  Encouraged patient to to make next month appointment in person so we can refresh urine drug screen/contract; its a long drive/ Satisfied with current suboxone  management of cravings - refilled medications Continue Suboxone  as prescribed. Continue(s) with random pill uploads PDMP reviewed during this encounter.  Orders: -  Buprenorphine  HCl-Naloxone  HCl; Place 1 Film under the tongue daily at 6 (six) AM.  Dispense: 30 each; Refill: 0  Tobacco user Assessment & Plan: Still trying with patches Wellbutrin  nicotine  replacement  therapy    Generalized anxiety disorder -     Escitalopram  Oxalate; Take 1 tablet (10 mg total) by mouth daily.  Dispense: 30 tablet; Refill: 1  Gastroesophageal reflux disease without esophagitis -     Famotidine ; Take 1 tablet (20 mg total) by mouth 2 (two) times daily.  Dispense: 90 tablet; Refill: 4

## 2024-12-01 NOTE — Assessment & Plan Note (Addendum)
 Opioid dependence with opioid-induced disorder   Opioid dependence is well-managed with Suboxone . She reports no significant cravings and manages holidays without increased risk. Although considering a dose adjustment, main issue is uncontrolled anxiety so we will address that and  Continue current Suboxone  dose. Scheduled contract renewal and urine drug screen in one month.  Last urine drug screen 10/2023 needs refreshed soon but appears to be doing great in recovery by limited Virtual Office Visit (Video Appointment)  Encouraged patient to to make next month appointment in person so we can refresh urine drug screen/contract; its a long drive/ Satisfied with current suboxone  management of cravings - refilled medications Continue Suboxone  as prescribed. Continue(s) with random pill uploads PDMP reviewed during this encounter.

## 2024-12-01 NOTE — Assessment & Plan Note (Signed)
 Generalized anxiety disorder   She experiences increased anxiety symptoms, including heart palpitations and facial flushing. Gabapentin  may not be effective, and previous SSRI trials were ineffective. Lexapro  is recommended for its efficacy in treating anxiety. Prescribed Lexapro  10 mg, instructed to start with 5 mg for the first week or two. Scheduled follow-up in one month to assess response to Lexapro .

## 2024-12-01 NOTE — Assessment & Plan Note (Addendum)
 She is attempting to reduce smoking, currently using nicotine  patches, gums, and Wellbutrin . A pack of cigarettes lasts two days, and she is considering quitting smoking entirely. Zyn pouches are available for nicotine  replacement. Encouraged cessation of smoking by January 9th. Advised use of nicotine  replacement therapies such as patches, gums, lozenges, or Zyn pouches.

## 2024-12-01 NOTE — Progress Notes (Signed)
 ====================================  [facility]: [phone]   --  Virtual Video Medical Office Visit --  Patient: Denise Rubio      Age: 38 y.o.       Sex:  female  Date:   12/01/2024 Today's Healthcare Provider: Bernardino KANDICE Cone, MD  ====================================    Chief Complaint/Reason For Visit: Medication Refill Suboxone  management.  Chart reviewed: has Hypertension; Anxiety; Chronic hepatitis C without hepatic coma (HCC); Insomnia; Tobacco user; History of drug use disorder; PTSD (post-traumatic stress disorder); Weight disorder; Idiopathic peripheral neuropathy; Chronic bronchitis (HCC); Elevated blood pressure reading; High risk medication use; Opioid dependence with opioid-induced disorder (HCC); FH: breast cancer; Encounter for monitoring Suboxone  maintenance therapy; Chronic bilateral low back pain with bilateral sciatica; Neuropathy; Drug-induced constipation; Subluxation of metatarsophalangeal joint of great toe, left, initial encounter; and Generalized anxiety disorder on their problem list. 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 Discussed the use of AI scribe software for clinical note transcription with the patient, who gave verbal consent to proceed.  History of Present Illness 38 year old female with a history of substance use disorder and anxiety who presents for follow-up on her recovery and medication management.  She is experiencing increased anxiety recently, characterized by a racing heart, hot ears, and facial flushing. She is currently taking gabapentin  300 mg three times a day for anxiety and pain but questions if she has plateaued on this  medication.  She is on Suboxone , taking half a dose in the morning and half in the evening, and feels it is a good dose for her. She has been on Suboxone  for over a year and is managing well without significant cravings, although she remains cautious about potential triggers during the holidays.  She is actively trying to quit smoking and has reduced her cigarette consumption from a pack a day to one pack lasting two days. She is using Wellbutrin , nicotine  patches, and gum to aid in smoking cessation.  Her social history includes being in recovery from substance use and finding purpose in caring for her father, who has COPD and emphysema. She is financially stable and able to support her father, who recently returned from a hospital stay and is now on oxygen and a nebulizer.  She has a history of using Prilosec for stomach issues and is considering switching to Pepcid . She takes her medications, including blood pressure medication, at night as part of her regimen.  Medications reviewed: Current Outpatient Medications on File Prior to Visit  Medication Sig   buPROPion  (WELLBUTRIN  SR) 150 MG 12 hr tablet Take 1 tablet (150 mg total) by mouth 2 (two) times daily.   chlorthalidone  (HYGROTON ) 25 MG tablet Take 1 tablet (25 mg total) by mouth daily. Take in am, replaces hydrochloroTHIAZIDE .   famotidine -calcium carbonate-magnesium hydroxide (PEPCID  COMPLETE) 10-800-165 MG chewable tablet Chew 1 tablet by mouth 2 (two) times daily as needed.   gabapentin  (NEURONTIN ) 300 MG capsule Take 1 capsule (300 mg total) by mouth 4 (four) times daily.   ipratropium-albuterol  (DUONEB) 0.5-2.5 (3) MG/3ML SOLN Take 3 mLs by nebulization every 4 (four) hours as needed.   levonorgestrel (MIRENA) 20 MCG/DAY IUD by Intrauterine route.   lisinopril  (ZESTRIL ) 40 MG tablet Take 1 tablet (40 mg total) by mouth daily. History of tubal  but can't take if pregnant.   methocarbamol (ROBAXIN) 500 MG tablet Take 500 mg by mouth 4  (four) times daily as needed.   nicotine  (NICODERM CQ  - DOSED IN MG/24 HOURS) 14 mg/24hr patch RX #2 Weeks 5-6: 14 mg x 1 patch daily. Wear for 24 hours. If you have sleep disturbances, remove at bedtime.   nicotine  (NICODERM CQ  - DOSED IN MG/24 HOURS) 21 mg/24hr patch RX #1 Weeks 1-4: 21 mg x 1 patch daily. Wear for 24 hours. If you have sleep disturbances, remove at bedtime.   nicotine  (NICODERM CQ  - DOSED IN MG/24 HR) 7 mg/24hr patch RX #3 Weeks 7-8: 7 mg x 1 patch daily. Wear for 24 hours. If you have sleep disturbances, remove at bedtime.   nicotine  polacrilex (COMMIT) 4 MG lozenge RX #1 Weeks 1-6: 1 lozenge every 1-2 hours. Use at least 9 lozenges per day for the first 6 weeks. Max 20 lozenges per day.   nicotine  polacrilex (COMMIT) 4 MG lozenge RX #2 Weeks 7-9: 1 lozenge every 2-4 hours.Max 20 lozenges per day.   nicotine  polacrilex (COMMIT) 4 MG lozenge RX #3 Weeks 10-12: 1 lozenge every 4-8 hours.Max 20 lozenges per day.   nicotine  polacrilex (NICORETTE ) 4 MG gum RX #1 Weeks 1-6: 1 piece every 1-2 hours.Use at least 9 pieces of gum per day for the first 6 weeks. Max 24 pieces per day.   nicotine  polacrilex (NICORETTE ) 4 MG gum RX #2 Weeks 7-9: 1 piece every 2-4 hours.Max 24 pieces per day..   nicotine  polacrilex (NICORETTE ) 4 MG gum RX #3 Weeks 10-12: 1 piece every 4-8 hours.Max 24 pieces per day.   Olopatadine  HCl 0.2 % SOLN Apply 2 drops to eye daily at 6 (six) AM.   omeprazole  (PRILOSEC) 20 MG capsule Take 1 capsule (20 mg total) by mouth daily.   ondansetron  (ZOFRAN -ODT) 4 MG disintegrating tablet Take 4 mg by mouth 3 (three) times daily as needed.   ondansetron  (ZOFRAN -ODT) 4 MG disintegrating tablet Take 1 tablet (4 mg total) by mouth every 8 (eight) hours as needed for nausea or vomiting.   senna (SENOKOT) 8.6 MG tablet Take 1 tablet (8.6 mg total) by mouth daily.   VENTOLIN  HFA 108 (90 Base) MCG/ACT inhaler Inhale 2 puffs into the lungs every 4 (four) hours as needed for shortness of  breath.   No current facility-administered medications on file prior to visit.   Medications Discontinued During This Encounter  Medication Reason   Buprenorphine  HCl-Naloxone  HCl (SUBOXONE ) 8-2 MG FILM Reorder       Virtual Physical Exam: Exam Context: Evaluation limited by virtual format; however, patient is clearly visualized, cooperative, and engaged throughout. General Appearance: Well-developed, well-nourished; no acute distress by limited video assessment. Pulmonary: No respiratory distress apparent; normal work of breathing observed. Neurological: Patient is awake, alert, and demonstrates no obvious focal neurological deficits or cognitive impairments; sensorium appears unclouded. Psychiatric/Mental Status: Mood is appropriate; demeanor is pleasant, calm, and articulate. Speech is coherent and goal-directed with no evidence of slurred or pressured speech. No abnormal psychomotor activity noted. Substance Misuse Indicators: Pupils appear symmetric and reactive as far as can be assessed via video. No track marks, skin lesions, or other stigmata of substance misuse visible. No signs of intoxication or withdrawal are evident.  No visits with results within 1 Year(s) from this visit.  Latest known visit with results is:  Office Visit on 11/18/2023  Component Date Value   Buprenorphine  11/18/2023 Comment    Ethyl Glucuronide 11/18/2023  Comment    Amphetamines 11/18/2023 Negative    Barbiturates 11/18/2023 Negative    Benzodiazepines 11/18/2023 Negative    Cocaine Metabolite 11/18/2023 Negative    Phencyclidine (PCP) 11/18/2023 Negative    Marijuana MTB (THC) 11/18/2023 Comment    6-Acetylmorphine 11/18/2023 Negative    Opiates 11/18/2023 Negative    Oxycodone 11/18/2023 Negative    Tapentadol 11/18/2023 Negative    Fentanyl 11/18/2023 Negative    Methadone  11/18/2023 Negative    Propoxyphene 11/18/2023 Negative    Tramadol  11/18/2023 Negative    Carisoprodol 11/18/2023  Negative    Gabapentin  11/18/2023 Negative    Creatinine 11/18/2023 100    Urine pH 11/18/2023 7.5    Nitrites 11/18/2023 Negative    BUPRENORPHINE  11/18/2023 ++POSITIVE++ (A)    Buprenorphine /CR 11/18/2023 35    Norbuprenorphine/CR 11/18/2023 200    Norbup/Bup Ratio 11/18/2023 5.71    OPIATE ANTAGONIST 11/18/2023 ++POSITIVE++ (A)    Naloxone  11/18/2023 33    CANNABINOIDS 11/18/2023 ++POSITIVE++ (A)    THC/CR Ratio 11/18/2023 92    ETHANOL BIOMARKERS 11/18/2023 ++POSITIVE++ (A)    Ethyl Glucuronide 11/18/2023 >50000    Ethyl Sulfate 11/18/2023 7,878   No image results found. No results found.US  BREAST LTD UNI LEFT INC AXILLA Result Date: 02/19/2023 CLINICAL DATA:  Patient presents to the emergency room for possible left breast infection/abscess. EXAM: ULTRASOUND OF THE LEFT BREAST COMPARISON:  Previous exam(s). FINDINGS: Note that this exam was performed through the emergency department as there was no breast imaging radiologist present for the exam. Targeted ultrasound is performed, showing a superficial complex fluid collection within the thickened skin of the area of concern over the upper central periareolar region. This collection as a small component extending deeper into the breast tissue and may be an evolving abscess. IMPRESSION: Superficial complex fluid collection within the skin of the upper central periareolar region of the left breast. In the proper clinical setting, this is compatible with superficial infection/mastitis with small deeper component possibly evolving abscess. RECOMMENDATION: Recommend initiating 7-10 day course of antibiotics as well as further complete diagnostic imaging workup with mammogram/ultrasound at the [facility]. I have discussed the findings and recommendations with the patient. If applicable, a reminder letter will be sent to the patient regarding the next appointment. BI-RADS CATEGORY  0: Incomplete. Need additional imaging evaluation and/or  prior mammograms for comparison. Electronically Signed   By: Toribio Agreste M.D.   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 She reports no significant cravings and manages holidays without increased risk. Although considering a dose adjustment, main issue is uncontrolled anxiety so we will address that and  Continue current Suboxone  dose. Scheduled contract renewal and urine drug screen in one month.  Last urine drug screen 10/2023 needs refreshed soon but appears to be doing great in recovery by limited Virtual Office Visit (Video Appointment)  Encouraged patient to to make next month appointment in person so we can refresh urine drug screen/contract; its a long drive/ Satisfied with current suboxone  management of cravings - refilled medications Continue Suboxone  as prescribed. Continue(s) with random pill uploads PDMP reviewed during this encounter.  Tobacco user She is attempting to reduce smoking, currently using nicotine  patches, gums, and Wellbutrin . A pack of cigarettes lasts two days, and she is considering quitting  smoking entirely. Zyn pouches are available for nicotine  replacement. Encouraged cessation of smoking by January 9th. Advised use of nicotine  replacement therapies such as patches, gums, lozenges, or Zyn pouches. Generalized anxiety disorder Generalized anxiety disorder   She experiences increased anxiety symptoms, including heart palpitations and facial flushing. Gabapentin  may not be effective, and previous SSRI trials were ineffective. Lexapro  is recommended for its efficacy in treating anxiety. Prescribed Lexapro  10 mg, instructed to start with 5 mg for the first week or two. Scheduled follow-up in one month to assess  response to Lexapro . Gastroesophageal reflux disease without esophagitis Currently managed with Prilosec. Long-term use of Prilosec can lead to vitamin deficiencies, kidney disease, and dementia. Transition to Pepcid  Complete is recommended to reduce these risks. Prescribed Pepcid  Complete as an alternative to Prilosec. Advised gradual tapering off Prilosec.  ORDER ASSOCIATIONS  #   DIAGNOSIS / CONDITION ICD-10 ENCOUNTER ORDER     ICD-10-CM   1. 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 High risk medication use  Z79.899     3. Opioid dependence with opioid-induced disorder (HCC)  F11.29 Buprenorphine  HCl-Naloxone  HCl (SUBOXONE ) 8-2 MG FILM    4. Tobacco user  Z72.0     5. Generalized anxiety disorder  F41.1 escitalopram  (LEXAPRO ) 10 MG tablet    6. Gastroesophageal reflux disease without esophagitis  K21.9 famotidine  (PEPCID ) 20 MG tablet     Meds ordered this encounter  Medications   Buprenorphine  HCl-Naloxone  HCl (SUBOXONE ) 8-2 MG FILM    Sig: Place 1 Film under the tongue daily at 6 (six) AM.    Dispense:  30 each    Refill:  0   escitalopram  (LEXAPRO ) 10 MG tablet    Sig: Take 1 tablet (10 mg total) by mouth daily.    Dispense:  30 tablet    Refill:  1   famotidine  (PEPCID ) 20 MG tablet    Sig: Take 1 tablet (20 mg total) by mouth 2 (two) times daily.    Dispense:  90 tablet    Refill:  4       Treatment plan discussed and reviewed in detail. Explained medication safety and potential side effects.  Answered all patient questions and confirmed understanding and comfort with the plan. Encouraged patient to contact our office if they have any questions or concerns.  Agreed on patient coming for a sooner office visit if symptoms worsen, persist, or new symptoms develop. Discussed precautions in case of needing to visit the Emergency Department.    ----------------------------------------------------- Attestation:  Today's Healthcare Provider  Bernardino KANDICE Cone, MD was located at office at [facility] [address], [place] [address].  The patient was located at home. All video encounter participant identities and locations confirmed visually and verbally.Today's Telemedicine visit was conducted via synchronous Video after consent for telemedicine was obtained:  Video connection was never lost   This document was transcribed and resynthesized, in part, by artificial intelligence ([employer])  using HIPAA-compliant recording of the clinical interaction;   We have discussed the our use of AI scribe software for clinical note transcription with the patient, who has given verbal consent to proceed.

## 2025-01-01 ENCOUNTER — Ambulatory Visit: Admitting: Internal Medicine

## 2025-01-01 ENCOUNTER — Telehealth: Payer: Self-pay | Admitting: Internal Medicine

## 2025-01-01 ENCOUNTER — Encounter: Payer: Self-pay | Admitting: Internal Medicine

## 2025-01-01 VITALS — BP 130/82 | HR 72 | Temp 98.0°F | Ht 61.0 in | Wt 148.0 lb

## 2025-01-01 DIAGNOSIS — F419 Anxiety disorder, unspecified: Secondary | ICD-10-CM

## 2025-01-01 DIAGNOSIS — R6882 Decreased libido: Secondary | ICD-10-CM

## 2025-01-01 DIAGNOSIS — F424 Excoriation (skin-picking) disorder: Secondary | ICD-10-CM | POA: Diagnosis not present

## 2025-01-01 DIAGNOSIS — Z79891 Long term (current) use of opiate analgesic: Secondary | ICD-10-CM

## 2025-01-01 DIAGNOSIS — F4310 Post-traumatic stress disorder, unspecified: Secondary | ICD-10-CM

## 2025-01-01 DIAGNOSIS — Z6827 Body mass index (BMI) 27.0-27.9, adult: Secondary | ICD-10-CM | POA: Diagnosis not present

## 2025-01-01 DIAGNOSIS — Z3040 Encounter for surveillance of contraceptives, unspecified: Secondary | ICD-10-CM

## 2025-01-01 DIAGNOSIS — A0471 Enterocolitis due to Clostridium difficile, recurrent: Secondary | ICD-10-CM | POA: Insufficient documentation

## 2025-01-01 DIAGNOSIS — F411 Generalized anxiety disorder: Secondary | ICD-10-CM

## 2025-01-01 DIAGNOSIS — F5101 Primary insomnia: Secondary | ICD-10-CM | POA: Diagnosis not present

## 2025-01-01 DIAGNOSIS — Z79899 Other long term (current) drug therapy: Secondary | ICD-10-CM

## 2025-01-01 DIAGNOSIS — N951 Menopausal and female climacteric states: Secondary | ICD-10-CM | POA: Diagnosis not present

## 2025-01-01 DIAGNOSIS — F32A Depression, unspecified: Secondary | ICD-10-CM

## 2025-01-01 DIAGNOSIS — E669 Obesity, unspecified: Secondary | ICD-10-CM | POA: Diagnosis not present

## 2025-01-01 DIAGNOSIS — F1129 Opioid dependence with unspecified opioid-induced disorder: Secondary | ICD-10-CM | POA: Diagnosis not present

## 2025-01-01 LAB — CBC WITH DIFFERENTIAL/PLATELET
Basophils Absolute: 0 K/uL (ref 0.0–0.1)
Basophils Relative: 0.2 % (ref 0.0–3.0)
Eosinophils Absolute: 0.8 K/uL — ABNORMAL HIGH (ref 0.0–0.7)
Eosinophils Relative: 4 % (ref 0.0–5.0)
HCT: 45.6 % (ref 36.0–46.0)
Hemoglobin: 15.1 g/dL — ABNORMAL HIGH (ref 12.0–15.0)
Lymphocytes Relative: 12.1 % (ref 12.0–46.0)
Lymphs Abs: 2.4 K/uL (ref 0.7–4.0)
MCHC: 33.2 g/dL (ref 30.0–36.0)
MCV: 94.2 fl (ref 78.0–100.0)
Monocytes Absolute: 1 K/uL (ref 0.1–1.0)
Monocytes Relative: 5.3 % (ref 3.0–12.0)
Neutro Abs: 15.3 K/uL — ABNORMAL HIGH (ref 1.4–7.7)
Neutrophils Relative %: 78.4 % — ABNORMAL HIGH (ref 43.0–77.0)
Platelets: 357 K/uL (ref 150.0–400.0)
RBC: 4.84 Mil/uL (ref 3.87–5.11)
RDW: 13.6 % (ref 11.5–15.5)
WBC: 19.5 K/uL (ref 4.0–10.5)

## 2025-01-01 LAB — LIPID PANEL
Cholesterol: 174 mg/dL (ref 28–200)
HDL: 42.2 mg/dL
LDL Cholesterol: 106 mg/dL — ABNORMAL HIGH (ref 10–99)
NonHDL: 131.89
Total CHOL/HDL Ratio: 4
Triglycerides: 127 mg/dL (ref 10.0–149.0)
VLDL: 25.4 mg/dL (ref 0.0–40.0)

## 2025-01-01 MED ORDER — ESCITALOPRAM OXALATE 10 MG PO TABS: 10.0000 mg | ORAL_TABLET | Freq: Every day | ORAL | 1 refills | Status: AC

## 2025-01-01 MED ORDER — QUETIAPINE FUMARATE 50 MG PO TABS: 50.0000 mg | ORAL_TABLET | Freq: Every day | ORAL | 1 refills | Status: AC

## 2025-01-01 MED ORDER — FIDAXOMICIN 200 MG PO TABS: ORAL_TABLET | ORAL | 0 refills | Status: AC

## 2025-01-01 MED ORDER — BUPRENORPHINE HCL-NALOXONE HCL 8-2 MG SL FILM: 1.0000 | ORAL_FILM | Freq: Every day | SUBLINGUAL | 0 refills | Status: AC

## 2025-01-01 NOTE — Progress Notes (Unsigned)
 ==============================  [facility]: [phone]   -- Medical Office Visit --  Patient: Denise Rubio      Age: 39 y.o.       Sex:  female  Date:   01/01/2025 Today's Healthcare Provider: Bernardino KANDICE Cone, MD  ==============================   Chief Complaint: Medication Refill and referral obgyn (Needs to remove iud been five years.)  Discussed the use of AI scribe software for clinical note transcription with the patient, who gave verbal consent to proceed. History of Present Illness Denise Rubio is a 39 year old female with anxiety, depression, and insomnia who presents with anxiety, depression, and sleep problems.  She experiences ongoing anxiety, which she attributes to her skin picking behavior. Anxiety serves as a trigger for this behavior, and she has been attempting to manage it by attending more meetings on her phone.  She has a history of depression and is considering restarting medication. She previously found Lexapro  effective but has not been on any antidepressants recently. She experiences mood swings and heightened emotions, particularly regarding sensitive topics.  She has long-standing insomnia, present since her teenage years. She currently uses over-the-counter sleep aids but finds them increasingly ineffective. There is a family history of insomnia, as her mother also suffers from it and takes Ambien.  She has a history of substance use, including past heroin addiction, and is concerned about the impact of her past drug use on her health, particularly her stomach issues.  There is a family history of substance use, including her father, who she feels has given up on himself due to drug use. Her brother passed away from an overdose in 2018, and she has been involved in helping others in her family with substance use issues.  She is concerned about her sleep and mood stability and has previously found low doses of Seroquel   helpful for sleep.  Background Reviewed: Problem List: has Hypertension; Anxiety; Chronic hepatitis C without hepatic coma (HCC); Insomnia; Tobacco user; History of drug use disorder; PTSD (post-traumatic stress disorder); Weight disorder; Idiopathic peripheral neuropathy; Chronic bronchitis (HCC); Elevated blood pressure reading; High risk medication use; Opioid dependence with opioid-induced disorder (HCC); FH: breast cancer; Encounter for monitoring Suboxone  maintenance therapy; Chronic bilateral low back pain with bilateral sciatica; Neuropathy; Drug-induced constipation; Subluxation of metatarsophalangeal joint of great toe, left, initial encounter; and Generalized anxiety disorder on their problem list. Past Medical History:  has a past medical history of Anxiety, Asthma, Counseling about travel (11/26/2023), Heroin abuse (HCC), Hypertension, IV drug abuse (HCC) (04/01/2016), Opioid withdrawal (HCC) (04/03/2024), Polysubstance abuse (HCC) (04/01/2023), and SIRS (systemic inflammatory response syndrome) (HCC) (04/01/2016). Past Surgical History:   has a past surgical history that includes Abscess drainage (Left); Tubal ligation; Tonsillectomy; and Dental surgery. Social History:   reports that she has been smoking cigarettes. She has never used smokeless tobacco. She reports that she does not currently use drugs after having used the following drugs: Marijuana and Heroin. She reports that she does not drink alcohol . Family History:  family history includes Cancer in her brother; Drug abuse in her brother, brother, and brother; Hypertension in her father, mother, and sister. Allergies:  has no known allergies.   Medication Reconciliation: Current Outpatient Medications on File Prior to Visit  Medication Sig   buPROPion  (WELLBUTRIN  SR) 150 MG 12 hr tablet Take 1 tablet (150 mg total) by mouth 2 (two) times daily.   chlorthalidone  (HYGROTON ) 25 MG tablet Take 1 tablet (25 mg total)  by mouth daily.  Take in am, replaces hydrochloroTHIAZIDE .   escitalopram  (LEXAPRO ) 10 MG tablet Take 1 tablet (10 mg total) by mouth daily.   famotidine  (PEPCID ) 20 MG tablet Take 1 tablet (20 mg total) by mouth 2 (two) times daily.   gabapentin  (NEURONTIN ) 300 MG capsule Take 1 capsule (300 mg total) by mouth 4 (four) times daily.   ipratropium-albuterol  (DUONEB) 0.5-2.5 (3) MG/3ML SOLN Take 3 mLs by nebulization every 4 (four) hours as needed.   levonorgestrel (MIRENA) 20 MCG/DAY IUD by Intrauterine route.   lisinopril  (ZESTRIL ) 40 MG tablet Take 1 tablet (40 mg total) by mouth daily. History of tubal but can't take if pregnant.   Buprenorphine  HCl-Naloxone  HCl (SUBOXONE ) 8-2 MG FILM Place 1 Film under the tongue daily at 6 (six) AM.   famotidine -calcium carbonate-magnesium hydroxide (PEPCID  COMPLETE) 10-800-165 MG chewable tablet Chew 1 tablet by mouth 2 (two) times daily as needed.   methocarbamol (ROBAXIN) 500 MG tablet Take 500 mg by mouth 4 (four) times daily as needed.   nicotine  (NICODERM CQ  - DOSED IN MG/24 HOURS) 14 mg/24hr patch RX #2 Weeks 5-6: 14 mg x 1 patch daily. Wear for 24 hours. If you have sleep disturbances, remove at bedtime.   nicotine  (NICODERM CQ  - DOSED IN MG/24 HOURS) 21 mg/24hr patch RX #1 Weeks 1-4: 21 mg x 1 patch daily. Wear for 24 hours. If you have sleep disturbances, remove at bedtime.   nicotine  (NICODERM CQ  - DOSED IN MG/24 HR) 7 mg/24hr patch RX #3 Weeks 7-8: 7 mg x 1 patch daily. Wear for 24 hours. If you have sleep disturbances, remove at bedtime.   nicotine  polacrilex (COMMIT) 4 MG lozenge RX #1 Weeks 1-6: 1 lozenge every 1-2 hours. Use at least 9 lozenges per day for the first 6 weeks. Max 20 lozenges per day.   nicotine  polacrilex (COMMIT) 4 MG lozenge RX #2 Weeks 7-9: 1 lozenge every 2-4 hours.Max 20 lozenges per day.   nicotine  polacrilex (COMMIT) 4 MG lozenge RX #3 Weeks 10-12: 1 lozenge every 4-8 hours.Max 20 lozenges per day.   nicotine  polacrilex (NICORETTE ) 4 MG gum  RX #1 Weeks 1-6: 1 piece every 1-2 hours.Use at least 9 pieces of gum per day for the first 6 weeks. Max 24 pieces per day.   nicotine  polacrilex (NICORETTE ) 4 MG gum RX #2 Weeks 7-9: 1 piece every 2-4 hours.Max 24 pieces per day..   nicotine  polacrilex (NICORETTE ) 4 MG gum RX #3 Weeks 10-12: 1 piece every 4-8 hours.Max 24 pieces per day.   Olopatadine  HCl 0.2 % SOLN Apply 2 drops to eye daily at 6 (six) AM.   omeprazole  (PRILOSEC) 20 MG capsule Take 1 capsule (20 mg total) by mouth daily.   ondansetron  (ZOFRAN -ODT) 4 MG disintegrating tablet Take 4 mg by mouth 3 (three) times daily as needed.   ondansetron  (ZOFRAN -ODT) 4 MG disintegrating tablet Take 1 tablet (4 mg total) by mouth every 8 (eight) hours as needed for nausea or vomiting.   senna (SENOKOT) 8.6 MG tablet Take 1 tablet (8.6 mg total) by mouth daily.   VENTOLIN  HFA 108 (90 Base) MCG/ACT inhaler Inhale 2 puffs into the lungs every 4 (four) hours as needed for shortness of breath.   No current facility-administered medications on file prior to visit.  There are no discontinued medications.  Physical Exam:    01/01/2025   11:15 AM 02/05/2024    7:48 AM 01/22/2024    1:06 PM  Vitals with BMI  Height  5' 1 5' 1   Weight 148 lbs 163 lbs   BMI 27.98 30.81   Systolic 130  135  Diastolic 82  96  Pulse 72    Vital signs reviewed.  Nursing notes reviewed. Weight trend reviewed. Physical Activity: Not on file   General Appearance:  No acute distress appreciable.   Well-groomed, healthy-appearing female.  Well proportioned with no abnormal fat distribution.  Good muscle tone. Pulmonary:  Normal work of breathing at rest, no respiratory distress apparent. SpO2: 98 %  Musculoskeletal: All extremities are intact.  Neurological:  Awake, alert, oriented, and engaged.  No obvious focal neurological deficits or cognitive impairments.  Sensorium seems unclouded.   Speech is clear and coherent with logical content. Psychiatric:  Appropriate mood,  pleasant and cooperative demeanor, thoughtful and engaged during the exam Physical Exam      05/04/2024    8:50 AM 01/22/2024   12:46 PM 12/24/2023   10:50 AM 11/18/2023   10:23 AM  PHQ 2/9 Scores  PHQ - 2 Score 0 0 0 2  PHQ- 9 Score 0    5      Data saved with a previous flowsheet row definition    { {Insert previous labs (optional):23779} {See past labs  Heme  Chem  Endocrine  Serology  Results Review (optional):1} No results found for any visits on 01/01/25.} Office Visit on 11/18/2023  Component Date Value Ref Range Status   Buprenorphine  11/18/2023 Comment  CUTOFF:5 ng/mL Final   Ethyl Glucuronide 11/18/2023 Comment  CUTOFF:500 ng/mL Final   Amphetamines 11/18/2023 Negative  CUTOFF:300 ng/mL Final   Barbiturates 11/18/2023 Negative  CUTOFF:200 ng/mL Final   Benzodiazepines 11/18/2023 Negative  CUTOFF:50 ng/mL Final   Cocaine Metabolite 11/18/2023 Negative  CUTOFF:150 ng/mL Final   Phencyclidine (PCP) 11/18/2023 Negative  CUTOFF:25 ng/mL Final   Marijuana MTB (THC) 11/18/2023 Comment  CUTOFF:20 ng/mL Final   6-Acetylmorphine 11/18/2023 Negative  CUTOFF:10 ng/mL Final   Opiates 11/18/2023 Negative  CUTOFF:100 ng/mL Final   Oxycodone 11/18/2023 Negative  CUTOFF:100 ng/mL Final   Tapentadol 11/18/2023 Negative  CUTOFF:200 ng/mL Final   Fentanyl 11/18/2023 Negative  CUTOFF:2.0 ng/mL Final   Methadone  11/18/2023 Negative  CUTOFF:100 ng/mL Final   Propoxyphene 11/18/2023 Negative  CUTOFF:300 ng/mL Final   Tramadol  11/18/2023 Negative  CUTOFF:200 ng/mL Final   Carisoprodol 11/18/2023 Negative  CUTOFF:100 ng/mL Final   Gabapentin  11/18/2023 Negative  CUTOFF:1.0 ug/mL Final   Creatinine 11/18/2023 100  mg/dL Final   Urine pH 88/74/7975 7.5  4.5 - 8.9 Final   Nitrites 11/18/2023 Negative  NEGATIVE ug/mL Final   BUPRENORPHINE  11/18/2023 ++POSITIVE++ (A)   Final   Buprenorphine /CR 11/18/2023 35  ng/mg creat Final   Norbuprenorphine/CR 11/18/2023 200  ng/mg creat Final    Norbup/Bup Ratio 11/18/2023 5.71  >=0.3 Final   OPIATE ANTAGONIST 11/18/2023 ++POSITIVE++ (A)   Final   Naloxone  11/18/2023 33  ng/mg creat Final   CANNABINOIDS 11/18/2023 ++POSITIVE++ (A)   Final   THC/CR Ratio 11/18/2023 92  ng/mg creat Final   ETHANOL BIOMARKERS 11/18/2023 ++POSITIVE++ (A)   Final   Ethyl Glucuronide 11/18/2023 >50000  ng/mg creat Final   Ethyl Sulfate 11/18/2023 7,878  ng/mg creat Final  Admission on 02/19/2023, Discharged on 02/19/2023  Component Date Value Ref Range Status   WBC 02/19/2023 11.6 (H)  4.0 - 10.5 K/uL Final   RBC 02/19/2023 4.75  3.87 - 5.11 MIL/uL Final   Hemoglobin 02/19/2023 15.1 (H)  12.0 - 15.0 g/dL Final   HCT 97/72/7975 45.7  36.0 - 46.0 % Final   MCV 02/19/2023 96.2  80.0 - 100.0 fL Final   MCH 02/19/2023 31.8  26.0 - 34.0 pg Final   MCHC 02/19/2023 33.0  30.0 - 36.0 g/dL Final   RDW 97/72/7975 12.7  11.5 - 15.5 % Final   Platelets 02/19/2023 302  150 - 400 K/uL Final   nRBC 02/19/2023 0.0  0.0 - 0.2 % Final   Neutrophils Relative % 02/19/2023 68  % Final   Neutro Abs 02/19/2023 7.8 (H)  1.7 - 7.7 K/uL Final   Lymphocytes Relative 02/19/2023 20  % Final   Lymphs Abs 02/19/2023 2.3  0.7 - 4.0 K/uL Final   Monocytes Relative 02/19/2023 8  % Final   Monocytes Absolute 02/19/2023 0.9  0.1 - 1.0 K/uL Final   Eosinophils Relative 02/19/2023 4  % Final   Eosinophils Absolute 02/19/2023 0.5  0.0 - 0.5 K/uL Final   Basophils Relative 02/19/2023 0  % Final   Basophils Absolute 02/19/2023 0.0  0.0 - 0.1 K/uL Final   Immature Granulocytes 02/19/2023 0  % Final   Abs Immature Granulocytes 02/19/2023 0.05  0.00 - 0.07 K/uL Final   Sodium 02/19/2023 133 (L)  135 - 145 mmol/L Final   Potassium 02/19/2023 4.5  3.5 - 5.1 mmol/L Final   Chloride 02/19/2023 100  98 - 111 mmol/L Final   CO2 02/19/2023 22  22 - 32 mmol/L Final   Glucose, Bld 02/19/2023 77  70 - 99 mg/dL Final   BUN 97/72/7975 10  6 - 20 mg/dL Final   Creatinine, Ser 02/19/2023 0.70  0.44 -  1.00 mg/dL Final   Calcium 97/72/7975 9.1  8.9 - 10.3 mg/dL Final   Total Protein 97/72/7975 7.6  6.5 - 8.1 g/dL Final   Albumin 97/72/7975 3.6  3.5 - 5.0 g/dL Final   AST 97/72/7975 62 (H)  15 - 41 U/L Final   ALT 02/19/2023 69 (H)  0 - 44 U/L Final   Alkaline Phosphatase 02/19/2023 86  38 - 126 U/L Final   Total Bilirubin 02/19/2023 1.1  0.3 - 1.2 mg/dL Final   GFR, Estimated 02/19/2023 >60  >60 mL/min Final   Anion gap 02/19/2023 11  5 - 15 Final       ASSESSMENT & PLAN   Assessment & Plan Opioid dependence with opioid-induced disorder (HCC) Anxiety High risk medication use Encounter for monitoring Suboxone  maintenance therapy  Encounter for surveillance of contraceptives, unspecified contraceptive Refer care to gynecology for intrauterine device evaluate  Primary insomnia Will resume seroquel , suspicious for bipolar, she agreed to psychiatry if we can get in . Generalized anxiety disorder  PTSD (post-traumatic stress disorder)  Hot flushes, perimenopausal  Low libido  Obesity due to energy imbalance   {Assessment and Plan Assessment & Plan Opioid dependence with opioid-induced disorder   She is managing opioid dependence with associated opioid-induced disorder, expressing fear of government and police, and discussing risks such as legal consequences and overdose. A history of a seizure linked to drug withdrawal is noted. She is motivated to maintain sobriety, actively participating in meetings and support systems. Continue the current management plan, encourage participation in support meetings and counseling, and discuss potential use of transcranial magnetic stimulation for cravings and depression.  Generalized anxiety disorder   Anxiety contributes to skin picking behavior, with stressors including her father's health and her own health concerns. She manages anxiety through meetings and support systems. Continue the current management plan and encourage  participation in support  meetings and counseling.  Primary insomnia   Chronic insomnia since age 73 is worsened by anxiety and depression. She struggles with sleep despite over-the-counter aids and has a family history of insomnia. Seroquel  has been effective previously. Prescribe Seroquel  for sleep, cautioning against nightly use, and refer to psychiatry for further evaluation and management.  Depressive disorder   She experiences depressive symptoms, mood swings, and emotional sensitivity, with episodes of feeling overwhelmed and crying easily. Although not recently on antidepressants, Lexapro  has been effective in the past. Prescribe Lexapro  and refer to psychiatry for further evaluation and management.  Bipolar disorder   Possible bipolar disorder is indicated by mood swings and episodes of feeling 'speedy' without stimulant use. A family history of mood disorders is present, and she has self-medicated previously. She is open to psychiatric evaluation and treatment. Refer to psychiatry for evaluation and management.  Post-traumatic stress disorder   PTSD symptoms from childhood trauma contribute to emotional distress and mood instability, with heightened emotional responses and crying easily. She is interested in exploring transcranial magnetic stimulation as a treatment. Discuss potential use of transcranial magnetic stimulation and refer to psychiatry for further evaluation and management.  General health maintenance   An annual drug screen was conducted. She is advised to avoid alcohol  due to potential interactions with Suboxone .  Recording duration: 19 minutes   }.NOTEINITAWV  ORDER ASSOCIATIONS  #   DIAGNOSIS / CONDITION ICD-10 ENCOUNTER ORDER     ICD-10-CM   1. Opioid dependence with opioid-induced disorder (HCC)  F11.29 Urine Drug Screen - [facility]    2. Encounter for surveillance of contraceptives, unspecified contraceptive  Z30.40     3. Anxiety  F41.9     4. High risk  medication use  Z79.899     5. Primary insomnia  F51.01     6. Encounter for monitoring Suboxone  maintenance therapy  Z51.81    Z79.891            Orders Placed in Encounter:   Lab Orders         Urine Drug Screen - [facility]     Imaging Orders  No imaging studies ordered today   Referral Orders  No referral(s) requested today   No orders of the defined types were placed in this encounter.   Orders Placed This Encounter  Procedures   Urine Drug Screen - [facility]   ED Discharge Orders          Ordered    Urine Drug Screen - [facility]        01/01/25 1146              This document was synthesized by artificial intelligence ([employer]) using HIPAA-compliant recording of the clinical interaction;   We discussed the use of AI scribe software for clinical note transcription with the patient, who gave verbal consent to proceed. additional Info: This encounter employed state-of-the-art, real-time, collaborative documentation. The patient actively reviewed and assisted in updating their electronic medical record on a shared screen, ensuring transparency and facilitating joint problem-solving for the problem list, overview, and plan. This approach promotes accurate, informed care. The treatment plan was discussed and reviewed in detail, including medication safety, potential side effects, and all patient questions. We confirmed understanding and comfort with the plan. Follow-up instructions were established, including contacting the office for any concerns, returning if symptoms worsen, persist, or new symptoms develop, and precautions for potential emergency department visits.

## 2025-01-01 NOTE — Addendum Note (Signed)
 Addended by: Brenleigh Collet G on: 01/01/2025 03:29 PM   Modules accepted: Orders

## 2025-01-01 NOTE — Patient Instructions (Signed)
 It was a pleasure seeing you today! Your health and satisfaction are our top priorities.  Denise Cone, MD  VISIT SUMMARY: During your visit, we discussed your ongoing anxiety, depression, insomnia, and concerns related to your past substance use. We reviewed your current symptoms and history, and we have developed a plan to help manage your conditions and improve your overall well-being.  YOUR PLAN: -OPIOID DEPENDENCE WITH OPIOID-INDUCED DISORDER: Opioid dependence means you have a history of using opioids, which has led to physical or psychological reliance on the drug. We will continue your current management plan, encourage you to participate in support meetings and counseling, and discuss the potential use of transcranial magnetic stimulation for cravings and depression.  -GENERALIZED ANXIETY DISORDER: Generalized anxiety disorder involves excessive worry about various aspects of life. Your anxiety contributes to your skin picking behavior. We will continue your current management plan and encourage you to participate in support meetings and counseling.  -PRIMARY INSOMNIA: Primary insomnia is difficulty falling or staying asleep. Your insomnia has been present since your teenage years and is worsened by anxiety and depression. We will prescribe Seroquel  for sleep, cautioning against nightly use, and refer you to psychiatry for further evaluation and management.  -DEPRESSIVE DISORDER: Depressive disorder involves persistent feelings of sadness and loss of interest. You experience mood swings and emotional sensitivity. We will prescribe Lexapro  and refer you to psychiatry for further evaluation and management.  -BIPOLAR DISORDER: Bipolar disorder is characterized by mood swings that include emotional highs and lows. You have episodes of feeling 'speedy' without stimulant use. We will refer you to psychiatry for evaluation and management.  -POST-TRAUMATIC STRESS DISORDER: Post-traumatic stress  disorder (PTSD) is a mental health condition triggered by a traumatic event. Your PTSD symptoms contribute to emotional distress and mood instability. We will discuss the potential use of transcranial magnetic stimulation and refer you to psychiatry for further evaluation and management.  -GENERAL HEALTH MAINTENANCE: We conducted an annual drug screen. You are advised to avoid alcohol  due to potential interactions with Suboxone .  INSTRUCTIONS: Please follow up with psychiatry for further evaluation and management of your insomnia, depression, bipolar disorder, and PTSD. Continue attending support meetings and counseling for anxiety and opioid dependence. Avoid alcohol  due to potential interactions with Suboxone .  Your Providers PCP: [name] MATSU, MD,  [phone]) Referring Provider: [name] MATSU, MD,  [phone])  NEXT STEPS: [x]  Early Intervention: Schedule sooner appointment, call our on-call services, or go to emergency room if there is any significant Increase in pain or discomfort New or worsening symptoms Sudden or severe changes in your health [x]  Flexible Follow-Up: We recommend a No follow-ups on file. for optimal routine care. This allows for progress monitoring and treatment adjustments. [x]  Preventive Care: Schedule your annual preventive care visit! It's typically covered by insurance and helps identify potential health issues early. [x]  Lab & X-ray Appointments: Incomplete tests scheduled today, or call to schedule. X-rays: [facility] (M-F, 8:30am-noon or 1pm-5pm). [x]  Medical Information Release: Sign a release form at front desk to obtain relevant medical information we don't have.  MAKING THE MOST OF OUR FOCUSED 20 MINUTE APPOINTMENTS: [x]   Clearly state your top concerns at the beginning of the visit to focus our discussion [x]   If you anticipate you will need more time, please inform the front desk during scheduling - we can book multiple  appointments in the same week. [x]   If you have transportation problems- use our convenient video appointments or ask about  transportation support. [x]   We can get down to business faster if you use MyChart to update information before the visit and submit non-urgent questions before your visit. Thank you for taking the time to provide details through MyChart.  Let our nurse know and she can import this information into your encounter documents.  Arrival and Wait Times: [x]   Arriving on time ensures that everyone receives prompt attention. [x]   Early morning (8a) and afternoon (1p) appointments tend to have shortest wait times. [x]   Unfortunately, we cannot delay appointments for late arrivals or hold slots during phone calls.  Getting Answers and Following Up [x]   Simple Questions & Concerns: For quick questions or basic follow-up after your visit, reach us  at [phone] or MyChart messaging. [x]   Complex Concerns: If your concern is more complex, scheduling an appointment might be best. Discuss this with the staff to find the most suitable option. [x]   Lab & Imaging Results: We'll contact you directly if results are abnormal or you don't use MyChart. Most normal results will be on MyChart within 2-3 business days, with a review message from Dr. Jesus. Haven't heard back in 2 weeks? Need results sooner? Contact us  at [phone]. [x]   Referrals: Our referral coordinator will manage specialist referrals. The specialist's office should contact you within 2 weeks to schedule an appointment. Call us  if you haven't heard from them after 2 weeks.  Staying Connected [x]   MyChart: Activate your MyChart for the fastest way to access results and message us . See the last page of this paperwork for instructions on how to activate.  Bring to Your Next Appointment [x]   Medications: Please bring all your medication bottles to your next appointment to ensure we have an accurate record of your  prescriptions. [x]   Health Diaries: If you're monitoring any health conditions at home, keeping a diary of your readings can be very helpful for discussions at your next appointment.  Billing [x]   X-ray & Lab Orders: These are billed by separate companies. Contact the invoicing company directly for questions or concerns. [x]   Visit Charges: Discuss any billing inquiries with our administrative services team.  Your Satisfaction Matters [x]   Share Your Experience: We strive for your satisfaction! If you have any complaints, or preferably compliments, please let Dr. Jesus know directly or contact our Practice Administrators, Manuelita Rubin or [employer], by asking at the front desk.   Reviewing Your Records [x]   Review this early draft of your clinical encounter notes below and the final encounter summary tomorrow on MyChart after its been completed.  All orders placed so far are visible here: Opioid dependence with opioid-induced disorder (HCC) -     Urine Drug Screen - MAT -     Buprenorphine  HCl-Naloxone  HCl; Place 1 Film under the tongue daily at 6 (six) AM.  Dispense: 30 each; Refill: 0  Anxiety -     Ambulatory referral to Psychiatry -     QUEtiapine  Fumarate; Take 1 tablet (50 mg total) by mouth at bedtime.  Dispense: 90 tablet; Refill: 1  High risk medication use  Encounter for monitoring Suboxone  maintenance therapy  Encounter for surveillance of contraceptives, unspecified contraceptive -     Ambulatory referral to Obstetrics / Gynecology  Primary insomnia Assessment & Plan: Will resume seroquel , suspicious for bipolar, she agreed to psychiatry if we can get in .  Orders: -     Ambulatory referral to Psychiatry -     QUEtiapine  Fumarate; Take 1 tablet (50  mg total) by mouth at bedtime.  Dispense: 90 tablet; Refill: 1 -     TSH+FSH+TestT+LH+T3+DHEA-S+...  Generalized anxiety disorder -     QUEtiapine  Fumarate; Take 1 tablet (50 mg total) by mouth at bedtime.   Dispense: 90 tablet; Refill: 1 -     Escitalopram  Oxalate; Take 1 tablet (10 mg total) by mouth daily.  Dispense: 30 tablet; Refill: 1 -     CBC with Differential/Platelet -     TSH+FSH+TestT+LH+T3+DHEA-S+...  PTSD (post-traumatic stress disorder) -     QUEtiapine  Fumarate; Take 1 tablet (50 mg total) by mouth at bedtime.  Dispense: 90 tablet; Refill: 1  Hot flushes, perimenopausal -     TSH+FSH+TestT+LH+T3+DHEA-S+...  Low libido -     TSH+FSH+TestT+LH+T3+DHEA-S+...  Obesity due to energy imbalance -     Lipid panel  Skin picking habit  Depressive disorder

## 2025-01-01 NOTE — Telephone Encounter (Signed)
 CRITICAL VALUE STICKER  CRITICAL VALUE: WBC 19.5  RECEIVER (on-site recipient of call): Waddell Salles  DATE & TIME NOTIFIED: 01/01/2025 2:37pm  MESSENGER (representative from lab): [facility]  MD NOTIFIED: Jesus  TIME OF NOTIFICATION: 2:49pm  RESPONSE: stated she still has c-diff, and he will contact patient later, thank you for notification

## 2025-01-01 NOTE — Assessment & Plan Note (Signed)
 Will resume seroquel , suspicious for bipolar, she agreed to psychiatry if we can get in [place]. Chronic insomnia since age 39 is worsened by anxiety and depression. She struggles with sleep despite over-the-counter aids and has a family history of insomnia. Seroquel  has been effective previously. Prescribe Seroquel  for sleep, cautioning against nightly use, and refer to psychiatry for further evaluation and management.

## 2025-01-02 ENCOUNTER — Ambulatory Visit: Payer: Self-pay | Admitting: Internal Medicine

## 2025-01-02 DIAGNOSIS — F424 Excoriation (skin-picking) disorder: Secondary | ICD-10-CM | POA: Insufficient documentation

## 2025-01-02 DIAGNOSIS — E669 Obesity, unspecified: Secondary | ICD-10-CM | POA: Insufficient documentation

## 2025-01-02 DIAGNOSIS — R6882 Decreased libido: Secondary | ICD-10-CM | POA: Insufficient documentation

## 2025-01-02 DIAGNOSIS — N951 Menopausal and female climacteric states: Secondary | ICD-10-CM | POA: Insufficient documentation

## 2025-01-02 NOTE — Assessment & Plan Note (Signed)
 Encouraged continuing with weight loss efforts

## 2025-01-02 NOTE — Assessment & Plan Note (Signed)
 She is managing opioid dependence with associated opioid-induced disorder, expressing fear of government and police, and discussing risks such as legal consequences and overdose. A history of a seizure linked to drug withdrawal is noted. She is motivated to maintain sobriety, actively participating in meetings and support systems. Continue the current management plan, encourage participation in support meetings and counseling, and discuss potential use of transcranial magnetic stimulation for cravings and depression.  numerous suboxone  wrappers returned today, satisfying diversion prevention and detection plan: Photographs Taken 01/02/2025 :   An annual drug screen was conducted. She is advised to avoid alcohol  due to potential interactions with Suboxone . Contract signed again 01/01/25 will be scanned PDMP reviewed during this encounter.

## 2025-01-02 NOTE — Assessment & Plan Note (Signed)
 Anxiety contributes to skin picking behavior, with stressors including her father's health and her own health concerns. She manages anxiety through meetings and support systems. Continue the current management plan and encourage participation in support meetings and counseling. Will try to get options for behavioral health near her home She experiences depressive symptoms, mood swings, and emotional sensitivity, with episodes of feeling overwhelmed and crying easily. Although not recently on antidepressants, Lexapro  has been effective in the past. Prescribe Lexapro  and refer to psychiatry for further evaluation and management. Possible bipolar disorder is indicated by mood swings and episodes of feeling 'speedy' without stimulant use. A family history of mood disorders is present, and she has self-medicated previously. She is open to psychiatric evaluation and treatment. Refer to psychiatry for evaluation and management. PTSD symptoms from childhood trauma contribute to emotional distress and mood instability, with heightened emotional responses and crying easily. She is interested in exploring transcranial magnetic stimulation as a treatment. Discuss potential use of transcranial magnetic stimulation and refer to psychiatry for further evaluation and management.

## 2025-01-02 NOTE — Assessment & Plan Note (Signed)
 Shared decision-making done; patient understood rationale and agreed to [facility] to confirm If perimenopausal

## 2025-01-04 LAB — TSH+FSH+TESTT+LH+T3+DHEA-S+...
DHEA-SO4: 140 ug/dL (ref 57.3–279.2)
Estradiol: 199 pg/mL
FSH: 7.6 m[IU]/mL
LH: 27.9 m[IU]/mL
Progesterone: 0.2 ng/mL
T3, Total: 178 ng/dL (ref 71–180)
TSH: 0.96 u[IU]/mL (ref 0.450–4.500)
Testosterone, Free: 1.2 pg/mL (ref 0.0–4.2)
Testosterone: 44 ng/dL (ref 8–60)

## 2025-01-04 NOTE — Progress Notes (Signed)
 Looks like pt seen message via my chart and message provider back

## 2025-01-06 ENCOUNTER — Telehealth: Payer: Self-pay

## 2025-01-06 ENCOUNTER — Other Ambulatory Visit (HOSPITAL_COMMUNITY): Payer: Self-pay

## 2025-01-06 DIAGNOSIS — A0472 Enterocolitis due to Clostridium difficile, not specified as recurrent: Secondary | ICD-10-CM

## 2025-01-06 LAB — DRUG SCREEN ONLY 18, URINE, MEDICATION ASSISTED TREATMENT (MAT)
6-AM (HEROIN) IA Scr Only: NEGATIVE ng/mL
AMPHETAMINES IA Scr Only: NEGATIVE ng/mL
BARBITURATES IA Scr Only: NEGATIVE ng/mL
BENZO CLASS IA Scr Only: NEGATIVE ng/mL
BUPRENORPHINE IA Scr Only: POSITIVE ng/mL — AB
CANNABINOIDS IA Scr Only: POSITIVE ng/mL — AB
CARISOPRODOL IA SCR ONLY: NEGATIVE ng/mL
COCAINE MTB IA Scr Only: NEGATIVE ng/mL
Creatinine: 269 mg/dL
ETHANOL MTB IA Scr Only: POSITIVE ng/mL — AB
FENTANYL IA Scr Only: NEGATIVE ng/mL
GABAPENTINOIDS IA Scr Only: NEGATIVE ug/mL
METHADONE IA Scr Only: NEGATIVE ng/mL
METHADONE MTB IA Scr Only: NEGATIVE ng/mL
Nitrites: NEGATIVE ug/mL
OPIATE CLASS IA Scr Only: NEGATIVE ng/mL
OXYCODONE IA Scr Only: NEGATIVE ng/mL
PHENCYCLIDINE IA Scr Only: NEGATIVE ng/mL
PROPOXYPHENE IA Scr Only: NEGATIVE ng/mL
TAPENTADOL IA Scr Only: NEGATIVE ng/mL
TRAMADOL IA Scr Only: NEGATIVE ng/mL
Urine pH: 6.1 (ref 4.5–8.9)

## 2025-01-06 NOTE — Telephone Encounter (Signed)
 Pharmacy Patient Advocate Encounter   Received notification from [employer] CMM KEY that prior authorization for QUEtiapine  Fumarate 50MG  tablets is required/requested.   Insurance verification completed.   The patient is insured through [facility] MEDICAID.   Per test claim: PA required; PA submitted to above mentioned insurance via Latent Key/confirmation #/EOC [id number] Status is pending

## 2025-01-06 NOTE — Telephone Encounter (Signed)
 Pharmacy Patient Advocate Encounter   Received notification from Onbase CMM KEY that prior authorization for DIFICID  200MG  TABS is required/requested.   Insurance verification completed.   The patient is insured through [facility] MEDICAID.    Per test claim, medication is not covered due to plan/benefit exclusion, PA not submitted at this time

## 2025-01-07 ENCOUNTER — Other Ambulatory Visit (HOSPITAL_COMMUNITY): Payer: Self-pay

## 2025-01-07 NOTE — Telephone Encounter (Signed)
 Pharmacy Patient Advocate Encounter  Received notification from [facility] MEDICAID that Prior Authorization for QUEtiapine  Fumarate 50MG  tablets  has been APPROVED from 01/06/25 to 01/06/26. Ran test claim, Copay is $4.00. This test claim was processed through [facility]- copay amounts may vary at other pharmacies due to pharmacy/plan contracts, or as the patient moves through the different stages of their insurance plan.   PA #/Case ID/Reference #: [id number]

## 2025-02-01 ENCOUNTER — Ambulatory Visit: Admitting: Internal Medicine
